# Patient Record
Sex: Female | Born: 1950 | Race: Black or African American | Hispanic: No | Smoking: Never smoker
Health system: Southern US, Community
[De-identification: ages and names within clinical notes are randomized; demographics above are authoritative.]

## PROBLEM LIST (undated history)

## (undated) ENCOUNTER — Ambulatory Visit (HOSPITAL_COMMUNITY): Admission: EM | Payer: Medicare PPO | Source: Home / Self Care

## (undated) DIAGNOSIS — E079 Disorder of thyroid, unspecified: Secondary | ICD-10-CM

## (undated) DIAGNOSIS — D219 Benign neoplasm of connective and other soft tissue, unspecified: Secondary | ICD-10-CM

## (undated) DIAGNOSIS — I10 Essential (primary) hypertension: Secondary | ICD-10-CM

## (undated) HISTORY — PX: KNEE SURGERY: SHX244

## (undated) HISTORY — PX: FOOT SURGERY: SHX648

## (undated) HISTORY — DX: Disorder of thyroid, unspecified: E07.9

## (undated) HISTORY — DX: Benign neoplasm of connective and other soft tissue, unspecified: D21.9

## (undated) HISTORY — PX: THYROID CYST EXCISION: SHX2511

## (undated) HISTORY — DX: Essential (primary) hypertension: I10

---

## 1994-11-28 HISTORY — PX: ABDOMINAL HYSTERECTOMY: SHX81

## 1999-10-14 ENCOUNTER — Other Ambulatory Visit: Admission: RE | Admit: 1999-10-14 | Discharge: 1999-10-14 | Payer: Self-pay | Admitting: Obstetrics and Gynecology

## 2000-05-23 ENCOUNTER — Encounter (HOSPITAL_BASED_OUTPATIENT_CLINIC_OR_DEPARTMENT_OTHER): Payer: Self-pay | Admitting: General Surgery

## 2000-05-25 ENCOUNTER — Ambulatory Visit (HOSPITAL_COMMUNITY): Admission: RE | Admit: 2000-05-25 | Discharge: 2000-05-26 | Payer: Self-pay | Admitting: General Surgery

## 2000-10-27 ENCOUNTER — Other Ambulatory Visit: Admission: RE | Admit: 2000-10-27 | Discharge: 2000-10-27 | Payer: Self-pay | Admitting: Obstetrics and Gynecology

## 2000-12-06 ENCOUNTER — Ambulatory Visit (HOSPITAL_COMMUNITY): Admission: RE | Admit: 2000-12-06 | Discharge: 2000-12-06 | Payer: Self-pay | Admitting: Obstetrics and Gynecology

## 2000-12-06 ENCOUNTER — Encounter: Payer: Self-pay | Admitting: Obstetrics and Gynecology

## 2001-12-07 ENCOUNTER — Ambulatory Visit (HOSPITAL_COMMUNITY): Admission: RE | Admit: 2001-12-07 | Discharge: 2001-12-07 | Payer: Self-pay | Admitting: Obstetrics and Gynecology

## 2001-12-07 ENCOUNTER — Encounter: Payer: Self-pay | Admitting: Obstetrics and Gynecology

## 2002-07-15 ENCOUNTER — Other Ambulatory Visit: Admission: RE | Admit: 2002-07-15 | Discharge: 2002-07-15 | Payer: Self-pay | Admitting: Obstetrics and Gynecology

## 2002-10-22 ENCOUNTER — Ambulatory Visit (HOSPITAL_COMMUNITY): Admission: RE | Admit: 2002-10-22 | Discharge: 2002-10-22 | Payer: Self-pay | Admitting: Internal Medicine

## 2002-10-22 ENCOUNTER — Encounter: Payer: Self-pay | Admitting: Internal Medicine

## 2003-10-30 ENCOUNTER — Other Ambulatory Visit: Admission: RE | Admit: 2003-10-30 | Discharge: 2003-10-30 | Payer: Self-pay | Admitting: Obstetrics and Gynecology

## 2004-11-11 ENCOUNTER — Other Ambulatory Visit: Admission: RE | Admit: 2004-11-11 | Discharge: 2004-11-11 | Payer: Self-pay | Admitting: Obstetrics and Gynecology

## 2005-11-15 ENCOUNTER — Other Ambulatory Visit: Admission: RE | Admit: 2005-11-15 | Discharge: 2005-11-15 | Payer: Self-pay | Admitting: Obstetrics and Gynecology

## 2006-07-02 ENCOUNTER — Emergency Department (HOSPITAL_COMMUNITY): Admission: EM | Admit: 2006-07-02 | Discharge: 2006-07-02 | Payer: Self-pay | Admitting: Emergency Medicine

## 2006-11-16 ENCOUNTER — Other Ambulatory Visit: Admission: RE | Admit: 2006-11-16 | Discharge: 2006-11-16 | Payer: Self-pay | Admitting: Obstetrics and Gynecology

## 2006-12-01 ENCOUNTER — Ambulatory Visit (HOSPITAL_COMMUNITY): Admission: RE | Admit: 2006-12-01 | Discharge: 2006-12-01 | Payer: Self-pay | Admitting: Obstetrics and Gynecology

## 2007-11-29 HISTORY — PX: MOLE REMOVAL: SHX2046

## 2007-11-29 HISTORY — PX: BREAST BIOPSY: SHX20

## 2007-12-05 ENCOUNTER — Ambulatory Visit (HOSPITAL_COMMUNITY): Admission: RE | Admit: 2007-12-05 | Discharge: 2007-12-05 | Payer: Self-pay | Admitting: Obstetrics and Gynecology

## 2008-09-17 ENCOUNTER — Encounter: Payer: Self-pay | Admitting: Women's Health

## 2008-09-17 ENCOUNTER — Other Ambulatory Visit: Admission: RE | Admit: 2008-09-17 | Discharge: 2008-09-17 | Payer: Self-pay | Admitting: Obstetrics and Gynecology

## 2008-09-17 ENCOUNTER — Ambulatory Visit: Payer: Self-pay | Admitting: Women's Health

## 2008-09-23 ENCOUNTER — Encounter: Admission: RE | Admit: 2008-09-23 | Discharge: 2008-09-23 | Payer: Self-pay | Admitting: Obstetrics and Gynecology

## 2008-10-27 ENCOUNTER — Encounter (INDEPENDENT_AMBULATORY_CARE_PROVIDER_SITE_OTHER): Payer: Self-pay | Admitting: Diagnostic Radiology

## 2008-10-27 ENCOUNTER — Encounter: Admission: RE | Admit: 2008-10-27 | Discharge: 2008-10-27 | Payer: Self-pay | Admitting: Obstetrics and Gynecology

## 2009-01-13 ENCOUNTER — Encounter: Admission: RE | Admit: 2009-01-13 | Discharge: 2009-01-13 | Payer: Self-pay | Admitting: Obstetrics and Gynecology

## 2009-12-24 ENCOUNTER — Ambulatory Visit: Payer: Self-pay | Admitting: Women's Health

## 2009-12-24 ENCOUNTER — Other Ambulatory Visit: Admission: RE | Admit: 2009-12-24 | Discharge: 2009-12-24 | Payer: Self-pay | Admitting: Obstetrics and Gynecology

## 2010-01-18 ENCOUNTER — Encounter: Admission: RE | Admit: 2010-01-18 | Discharge: 2010-01-18 | Payer: Self-pay | Admitting: Family Medicine

## 2010-03-03 ENCOUNTER — Ambulatory Visit: Payer: Self-pay | Admitting: Obstetrics and Gynecology

## 2010-04-30 ENCOUNTER — Ambulatory Visit: Payer: Self-pay | Admitting: Women's Health

## 2010-12-29 ENCOUNTER — Other Ambulatory Visit: Payer: Self-pay | Admitting: Women's Health

## 2010-12-29 DIAGNOSIS — Z139 Encounter for screening, unspecified: Secondary | ICD-10-CM

## 2010-12-29 DIAGNOSIS — Z1231 Encounter for screening mammogram for malignant neoplasm of breast: Secondary | ICD-10-CM

## 2011-01-06 ENCOUNTER — Other Ambulatory Visit: Payer: Self-pay | Admitting: Women's Health

## 2011-01-06 ENCOUNTER — Encounter (INDEPENDENT_AMBULATORY_CARE_PROVIDER_SITE_OTHER): Payer: BC Managed Care – PPO | Admitting: Women's Health

## 2011-01-06 DIAGNOSIS — Z124 Encounter for screening for malignant neoplasm of cervix: Secondary | ICD-10-CM | POA: Insufficient documentation

## 2011-01-06 DIAGNOSIS — Z01419 Encounter for gynecological examination (general) (routine) without abnormal findings: Secondary | ICD-10-CM

## 2011-01-19 ENCOUNTER — Other Ambulatory Visit (HOSPITAL_COMMUNITY)
Admission: RE | Admit: 2011-01-19 | Discharge: 2011-01-19 | Disposition: A | Discharge status: Home / Self Care | Payer: BC Managed Care – PPO | Source: Ambulatory Visit | Attending: Obstetrics and Gynecology | Admitting: Obstetrics and Gynecology

## 2011-01-25 ENCOUNTER — Other Ambulatory Visit: Payer: Self-pay | Admitting: Women's Health

## 2011-01-25 ENCOUNTER — Ambulatory Visit
Admission: RE | Admit: 2011-01-25 | Discharge: 2011-01-25 | Disposition: A | Discharge status: Home / Self Care | Payer: BC Managed Care – PPO | Source: Ambulatory Visit | Attending: Women's Health | Admitting: Women's Health

## 2011-01-25 ENCOUNTER — Ambulatory Visit (HOSPITAL_COMMUNITY): Admission: RE | Admit: 2011-01-25 | Payer: BC Managed Care – PPO | Source: Ambulatory Visit

## 2011-01-25 DIAGNOSIS — Z1231 Encounter for screening mammogram for malignant neoplasm of breast: Secondary | ICD-10-CM

## 2011-04-15 NOTE — Op Note (Signed)
[place]. [facility]  Patient:    Becky Cox, Becky Cox                       MRN: [id number] Proc. Date: 05/25/00 Adm. 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 Pathology pending.  OPERATION/PROCEDURE:  Left thyroid lobectomy and isthmectomy.  ANESTHESIA: General.  SURGEON: Patrick L. Lurene Shadow, M.D.  ASSISTANT:  Marnee Spring. Wiliam Ke, M.D.  INDICATIONS:  The patient is a 61 year old woman who presented with a large thyroid nodule which on aspiration biopsy shows multiple Hurthle cells. She is brought to the operating room now for thyroid lobectomy with frozen section.  DESCRIPTION OF PROCEDURE:  Following the induction of satisfactory anesthesia, with the patient positioned supinely with the neck hyperextended. The anterior chest and neck were prepped and draped as to be included in the sterile operative field. A transverse incision was made in the neck and carried down through the skin and subcutaneous tissues approximately two finger breadths above the sternal notch. Dissection was carried down across the platysma muscle and a superior flap was raised up to the thyroid cartilage and an inferior flap down to the serial notch. The strap muscles were opened in the midline and dissection carried to the left, thus mobilizing the left lobe. The middle thyroid vein was taken between clips. Dissection of the inferior pole of the gland allowed the isolation of the recurrent laryngeal nerve. The left lower parathyroid gland was identified and spared. The thyroid gland lower pole vessels were taken between clamps and secured with ties of 3-0 silk. Dissection was carried up along the nerve, protecting it throughout the course of the dissection and freeing the  thyroid up to the upper pole. I did not recognize an upper pole thyroid gland. Dissection was close to the thyroid gland. The upper pole vessels were secured with clamps and doubly secured with 0 silk sutures. The thyroid was then mobilized across the isthmus and taking along with it the entire thyroid gland. This was carried all the way over to the left lobe of the thyroid. The thyroid was then transected between clamps and secured with suture ligatures of 3-0 Vicryl. The tumor was removed for frozen pathologic evaluation. The frozen section showed multiple Hurthle cells without any evidence of vascular invasion. The neck was then thoroughly irrigated and the wound checked for hemostasis, being treated with electrocautery. Sponge, instrument, and sharp counts were verified. The midline incision was closed with a running suture of 0-Vicryl suture. The platysma and subcutaneous tissues reapproximated with 3-0 Vicryl sutures. The skin closed with 5-0 Monocryl and reinforced with Steri-Strips. Sterile dressing applied. Anesthetic reversed. The patient removed from the operating room to the recovery room in stable condition having tolerated the procedure well. DD:  05/25/00 TD:  05/26/00 Job: [id number]

## 2011-04-29 HISTORY — PX: ANKLE SURGERY: SHX546

## 2011-05-02 ENCOUNTER — Emergency Department (HOSPITAL_COMMUNITY): Payer: BC Managed Care – PPO

## 2011-05-02 ENCOUNTER — Emergency Department (HOSPITAL_COMMUNITY)
Admission: EM | Admit: 2011-05-02 | Discharge: 2011-05-03 | Disposition: A | Discharge status: Home / Self Care | Payer: BC Managed Care – PPO | Attending: Emergency Medicine | Admitting: Emergency Medicine

## 2011-05-02 DIAGNOSIS — M25476 Effusion, unspecified foot: Secondary | ICD-10-CM | POA: Insufficient documentation

## 2011-05-02 DIAGNOSIS — G8929 Other chronic pain: Secondary | ICD-10-CM | POA: Insufficient documentation

## 2011-05-02 DIAGNOSIS — I10 Essential (primary) hypertension: Secondary | ICD-10-CM | POA: Insufficient documentation

## 2011-05-02 DIAGNOSIS — M25579 Pain in unspecified ankle and joints of unspecified foot: Secondary | ICD-10-CM | POA: Insufficient documentation

## 2011-05-02 DIAGNOSIS — M25473 Effusion, unspecified ankle: Secondary | ICD-10-CM | POA: Insufficient documentation

## 2011-05-24 ENCOUNTER — Ambulatory Visit (HOSPITAL_BASED_OUTPATIENT_CLINIC_OR_DEPARTMENT_OTHER)
Admission: RE | Admit: 2011-05-24 | Discharge: 2011-05-24 | Disposition: A | Discharge status: Home / Self Care | Payer: BC Managed Care – PPO | Source: Ambulatory Visit | Attending: Orthopaedic Surgery | Admitting: Orthopaedic Surgery

## 2011-05-24 DIAGNOSIS — M24473 Recurrent dislocation, unspecified ankle: Secondary | ICD-10-CM | POA: Insufficient documentation

## 2011-05-24 DIAGNOSIS — M24476 Recurrent dislocation, unspecified foot: Secondary | ICD-10-CM | POA: Insufficient documentation

## 2011-05-24 DIAGNOSIS — Z01810 Encounter for preprocedural cardiovascular examination: Secondary | ICD-10-CM | POA: Insufficient documentation

## 2011-05-24 LAB — POCT I-STAT, CHEM 8
BUN: 16 mg/dL (ref 6–23)
Calcium, Ion: 1.19 mmol/L (ref 1.12–1.32)
Chloride: 109 mEq/L (ref 96–112)
Creatinine, Ser: 0.7 mg/dL (ref 0.50–1.10)
TCO2: 25 mmol/L (ref 0–100)

## 2011-05-31 NOTE — Op Note (Signed)
  NAMELIZZA, Becky Cox NO.:  [id number]  MEDICAL RECORD NO.:  [id number]  LOCATION:                                 FACILITY:  PHYSICIAN:  Lubertha Basque. Bellamy Judson, M.D.DATE OF BIRTH:  1951/01/19  DATE OF PROCEDURE:  05/24/2011 DATE OF DISCHARGE:                              OPERATIVE REPORT   PREOPERATIVE DIAGNOSIS:  Right ankle impingement.  POSTOPERATIVE DIAGNOSIS:  Right ankle impingement.  PROCEDURE:  Right ankle debridement.  ANESTHESIA:  General and block.  ATTENDING SURGEON:  Lubertha Basque. Jerl Santos, MD  ASSISTANT:  Lindwood Qua, PA   INDICATION FOR PROCEDURE:  The patient is a 60 year old woman with many months of intense right ankle pain.  This has persisted despite rest and an injection which did afford her transient relief.  She has experienced recurrent pain with trips to the emergency room and on x-ray, does have a small anterior spur.  She is offered an arthroscopy.  Informed operative consent was obtained after discussion of possible complications including reaction to anesthesia, infection, and neurovascular injury.  SUMMARY OF FINDINGS AND PROCEDURE:  Under general anesthesia and block, a right ankle arthroscopy was performed.  There were no degenerative changes that I could appreciate.  She did have some soft tissue impingement in the lateral gutter and a thorough debridement was done there.  The medial gutter was relatively benign.  She had a small anterior spur on the tibia dressed with resection.  DESCRIPTION OF PROCEDURE:  The patient was taken to the operating suite where general anesthetic was applied without difficulty.  She was also given a popliteal block in the preanesthesia area.  She was positioned supine and the right leg was placed into a well-padded stirrup.  Some light traction was placed across the ankle with an elastic band.  I then injected the ankle with 10 mL of normal saline from an anteromedial approach.  I then  made a small incision just medial to the anterior tibial tendon with blunt dissection down through the capsule.  The scope was introduced.  I made a second portal in a similar fashion anterolateral near the gutter by localizing using a spinal needle followed by a small incision and blunt dissection into the capsule. Findings were as noted above and procedure consisted of the removal of the soft tissue from the lateral gutter which was done with a small shaver.  We then removed a small anterior spur off the tibia with a bur. The ankle was thoroughly irrigated followed by reapproximation of portals loosely with nylon.  Adaptic was applied followed by dry gauze and a loose Ace wrap.  Estimated blood loss and intraoperative fluids can be obtained from anesthesia records.  DISPOSITION:  The patient was extubated in the operating room and taken to recovery room in stable addition.  She was to go home same day and follow up in the office next week.  I will contact her by phone tonight.     Lubertha Basque Jerl Santos, M.D.     PGD/MEDQ  D:  05/24/2011  T:  05/25/2011  Job:  [id number]  Electronically Signed by Marcene Corning M.D. on 05/31/2011 05:29:52 PM

## 2011-12-26 ENCOUNTER — Other Ambulatory Visit: Payer: Self-pay | Admitting: Family Medicine

## 2011-12-26 DIAGNOSIS — Z1231 Encounter for screening mammogram for malignant neoplasm of breast: Secondary | ICD-10-CM

## 2012-01-16 ENCOUNTER — Ambulatory Visit (INDEPENDENT_AMBULATORY_CARE_PROVIDER_SITE_OTHER): Payer: BC Managed Care – PPO | Admitting: Women's Health

## 2012-01-16 ENCOUNTER — Encounter: Payer: Self-pay | Admitting: Women's Health

## 2012-01-16 VITALS — BP 158/82 | Ht 63.0 in | Wt 235.0 lb

## 2012-01-16 DIAGNOSIS — Z01419 Encounter for gynecological examination (general) (routine) without abnormal findings: Secondary | ICD-10-CM

## 2012-01-16 NOTE — Patient Instructions (Signed)
zostovac pnuemovac Dtap

## 2012-01-16 NOTE — Progress Notes (Signed)
Becky Cox [birth year] [id number]    History:    The patient presents for annual exam.  TAH for fibroids and menorrhagia in 96, on no ERT. Hypertension and hyperthyroidism managed by primary care. History of normal Paps and mammograms. Normal DEXA in 2011. Colonoscopy February  2013 with one benign polyp.   Past medical history, past surgical history, family history and social history were all reviewed and documented in the EPIC chart. Works at [employer].   ROS:  A  ROS was performed and pertinent positives and negatives are included in the history.  Exam:  Filed Vitals:   01/16/12 1440  BP: 158/82    General appearance:  Normal Head/Neck:  Normal, without cervical or supraclavicular adenopathy. Thyroid:  Symmetrical, normal in size, without palpable masses or nodularity. Respiratory  Effort:  Normal  Auscultation:  Clear without wheezing or rhonchi Cardiovascular  Auscultation:  Regular rate, without rubs, murmurs or gallops  Edema/varicosities:  Not grossly evident Abdominal  Soft,nontender, without masses, guarding or rebound.  Liver/spleen:  No organomegaly noted  Hernia:  None appreciated  Skin  Inspection:  Grossly normal  Palpation:  Grossly normal Neurologic/psychiatric  Orientation:  Normal with appropriate conversation.  Mood/affect:  Normal  Genitourinary    Breasts: Examined lying and sitting/pendulous.     Right: Without masses, retractions, discharge or axillary adenopathy.     Left: Without masses, retractions, discharge or axillary adenopathy.   Inguinal/mons:  Normal without inguinal adenopathy  External genitalia:  Normal  BUS/Urethra/Skene's glands:  Normal  Bladder:  Normal  Vagina:  +1 rectocele/asymptomatic  Cervix: Absent  Uterus:   Adnexa/parametria:     Rt: Without masses or tenderness.   Lt: Without masses or tenderness.  Anus and perineum:   Digital rectal exam: Normal sphincter tone without palpated masses or  tenderness  Assessment/Plan:  61 y.o. DBF G2 P2 for annual exam.  +1 rectocele/asymptomatic  Postmenopausal on no ERT Hypertension/hypothyroidism-primary care labs and meds  Plan: SBE's, annual mammogram, increased exercise decrease calories for weight loss. Reviewed importance of weight loss for health. Has occasional itching/rash and groin reviewed prevention has none today. Normal DEXA in 2011 will repeat next year. Reviewed importance of weightbearing exercise, home safety and fall prevention. Encourage vitamin D 2000 daily.   Harrington Challenger [facility], 6:02 PM 01/16/2012

## 2012-01-27 ENCOUNTER — Ambulatory Visit
Admission: RE | Admit: 2012-01-27 | Discharge: 2012-01-27 | Disposition: A | Discharge status: Home / Self Care | Payer: BC Managed Care – PPO | Source: Ambulatory Visit | Attending: Family Medicine | Admitting: Family Medicine

## 2012-01-27 DIAGNOSIS — Z1231 Encounter for screening mammogram for malignant neoplasm of breast: Secondary | ICD-10-CM

## 2012-01-31 ENCOUNTER — Other Ambulatory Visit: Payer: Self-pay | Admitting: Family Medicine

## 2012-01-31 DIAGNOSIS — R928 Other abnormal and inconclusive findings on diagnostic imaging of breast: Secondary | ICD-10-CM

## 2012-02-01 ENCOUNTER — Ambulatory Visit
Admission: RE | Admit: 2012-02-01 | Discharge: 2012-02-01 | Disposition: A | Discharge status: Home / Self Care | Payer: BC Managed Care – PPO | Source: Ambulatory Visit | Attending: Family Medicine | Admitting: Family Medicine

## 2012-02-01 DIAGNOSIS — R928 Other abnormal and inconclusive findings on diagnostic imaging of breast: Secondary | ICD-10-CM

## 2012-10-31 IMAGING — CR DG ANKLE COMPLETE 3+V*R*
3 series · 3 of 3 positions shown · non-contrast
Comparison: None.

CLINICAL DATA: Medial right ankle pain for 1 month.

RIGHT ANKLE - COMPLETE 3+ VIEW

[t ankle joint ap right]
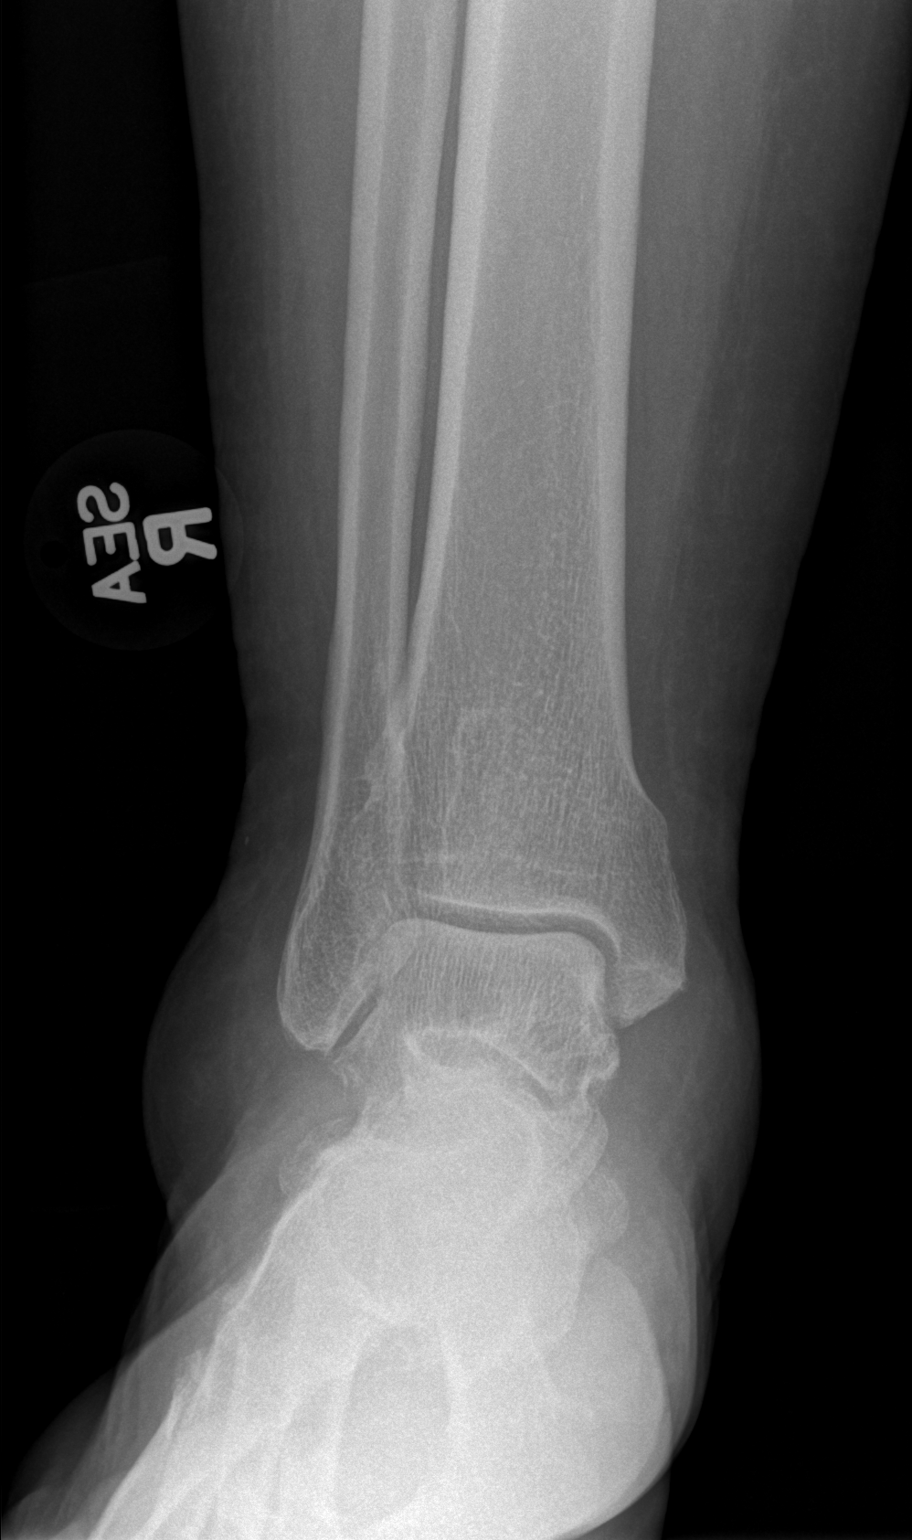

[t ankle joint oblique right]
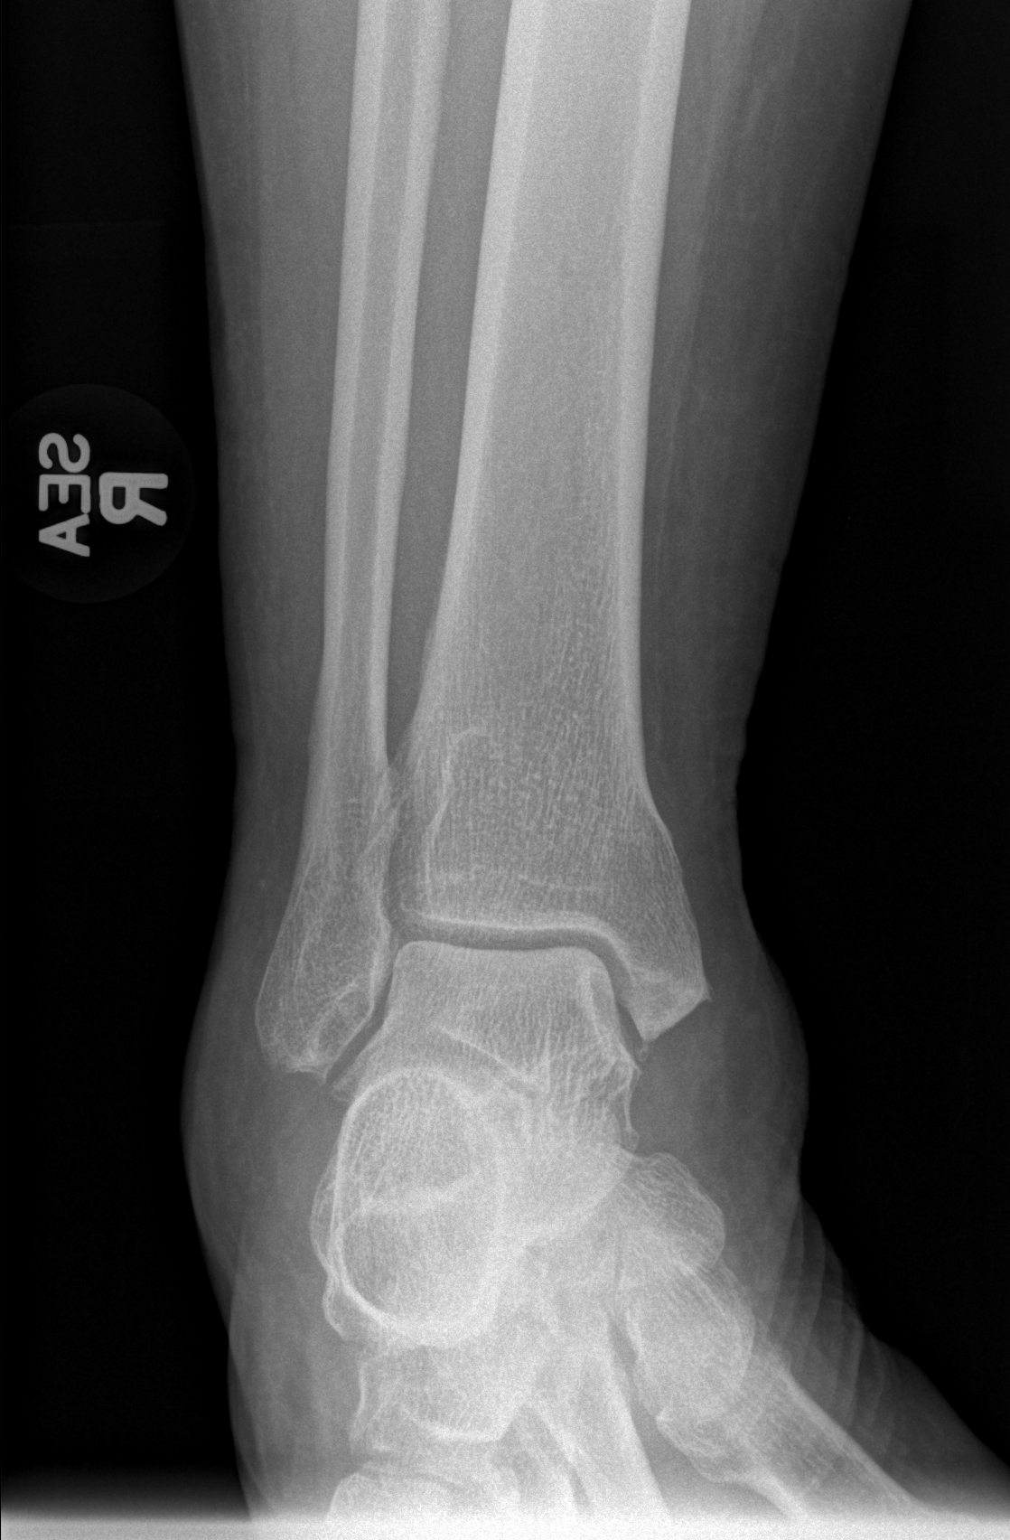

[t ankle joint lat right]
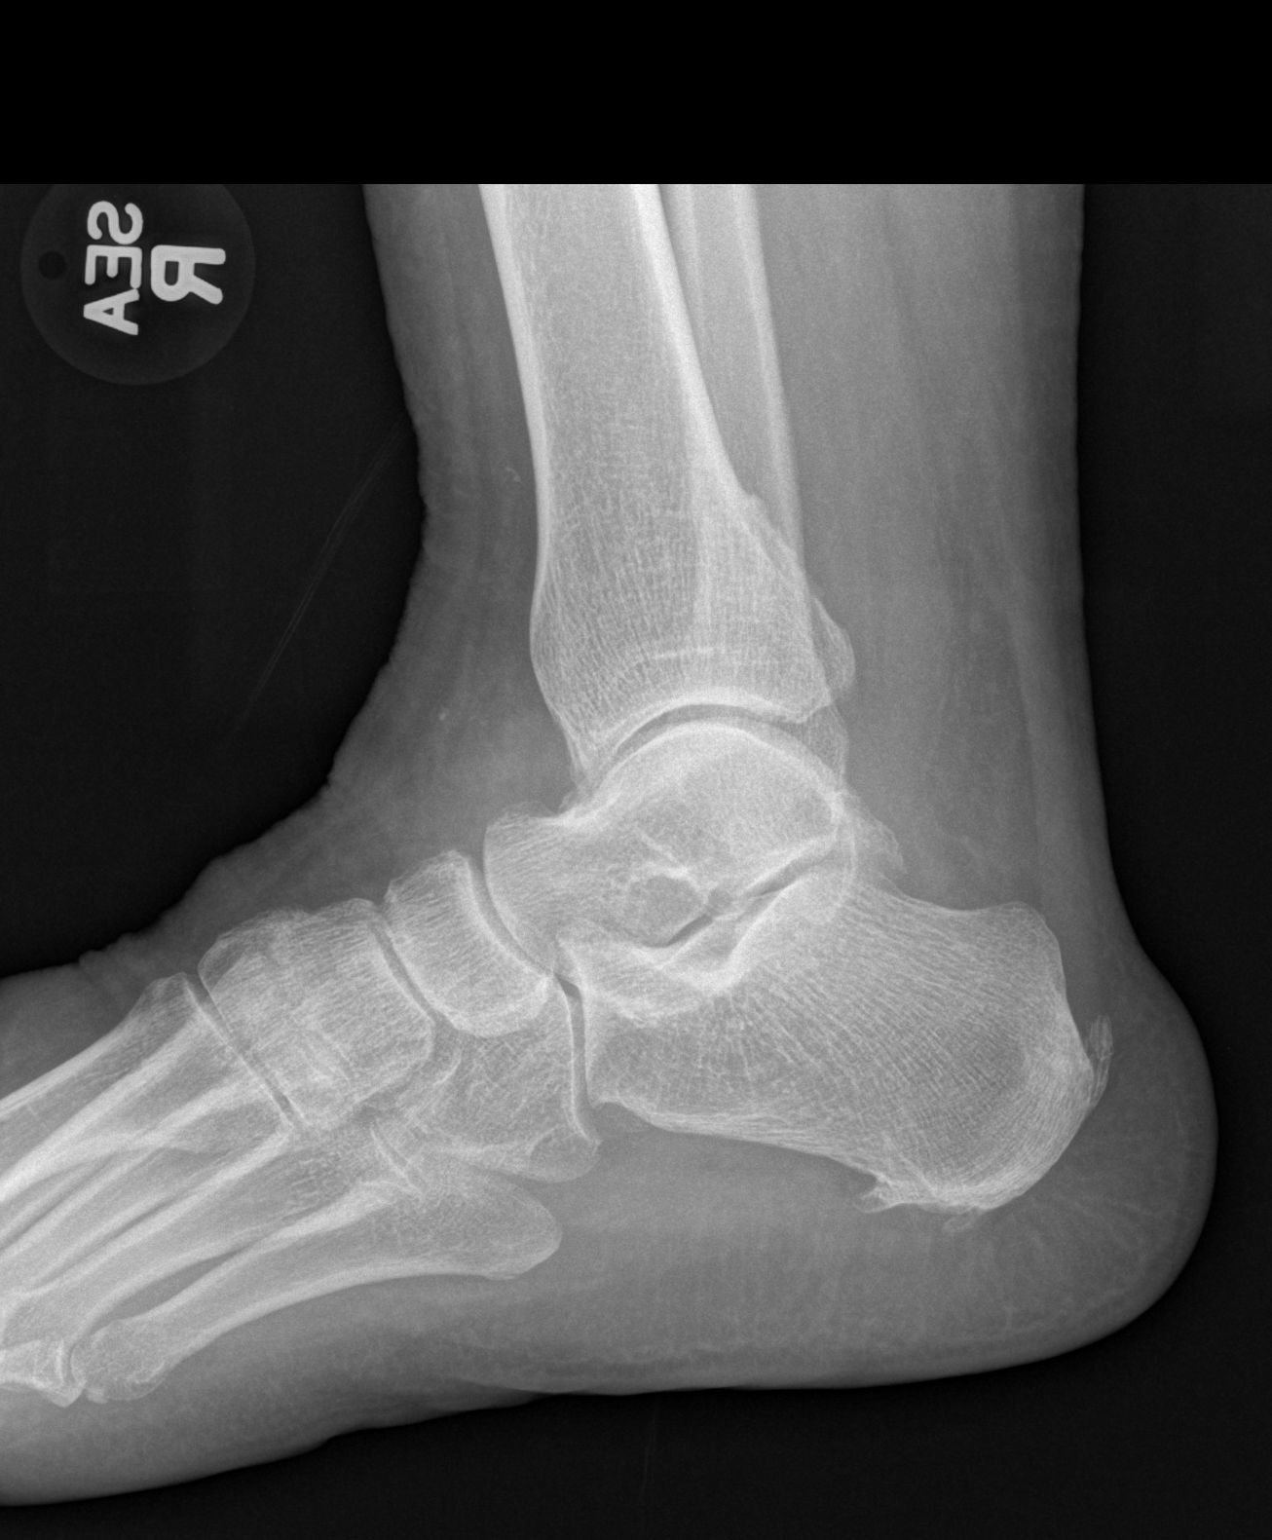

[3 of 3 positions shown; findings below may reference images not displayed]

FINDINGS: Medial and lateral malleolar spurring appears mild.

The plafond and talar dome appear intact.

No fracture identified.

Plantar and Achilles calcaneal spurs noted.
IMPRESSION: 1.  Hindfoot spurring as noted above, without acute bony findings.

## 2013-01-07 ENCOUNTER — Other Ambulatory Visit: Payer: Self-pay | Admitting: Family Medicine

## 2013-01-07 DIAGNOSIS — Z1239 Encounter for other screening for malignant neoplasm of breast: Secondary | ICD-10-CM

## 2013-01-09 ENCOUNTER — Encounter: Payer: BC Managed Care – PPO | Admitting: Women's Health

## 2013-01-16 ENCOUNTER — Ambulatory Visit (INDEPENDENT_AMBULATORY_CARE_PROVIDER_SITE_OTHER): Payer: BC Managed Care – PPO | Admitting: Women's Health

## 2013-01-16 ENCOUNTER — Encounter: Payer: Self-pay | Admitting: Women's Health

## 2013-01-16 VITALS — BP 136/80 | Ht 63.5 in | Wt 225.0 lb

## 2013-01-16 DIAGNOSIS — I10 Essential (primary) hypertension: Secondary | ICD-10-CM

## 2013-01-16 DIAGNOSIS — Z01419 Encounter for gynecological examination (general) (routine) without abnormal findings: Secondary | ICD-10-CM

## 2013-01-16 DIAGNOSIS — E039 Hypothyroidism, unspecified: Secondary | ICD-10-CM

## 2013-01-16 NOTE — Patient Instructions (Signed)

## 2013-01-16 NOTE — Progress Notes (Signed)
Becky Cox 1951-06-07 [id number]    History:    The patient presents for annual exam.  TAH 1996 for fibroids and menorrhagia/no ERT. Normal bone density 2011, T score 0.8 at spine bilateral hip average 0.5. Colonoscopy 2013 benign polyp. History of normal mammograms. Hypertension/hypothyroid managed primary care. Zostavac 2013.   Past medical history, past surgical history, family history and social history were all reviewed and documented in the EPIC chart. Retired from [employer] after 25 years. Helping with 24 year old mother who has Alzheimer's and sister with MS. Sister with breast and colon cancer. Numerous family members with hypertension.   ROS:  A  ROS was performed and pertinent positives and negatives are included in the history.  Exam:  Filed Vitals:   01/16/13 1151  BP: 136/80    General appearance:  Normal Head/Neck:  Normal, without cervical or supraclavicular adenopathy. Thyroid:  Symmetrical, normal in size, without palpable masses or nodularity. Respiratory  Effort:  Normal  Auscultation:  Clear without wheezing or rhonchi Cardiovascular  Auscultation:  Regular rate, without rubs, murmurs or gallops  Edema/varicosities:  Not grossly evident Abdominal  Soft,nontender, without masses, guarding or rebound.  Liver/spleen:  No organomegaly noted  Hernia:  None appreciated  Skin  Inspection:  Grossly normal/numerous skin tags  Palpation:  Grossly normal Neurologic/psychiatric  Orientation:  Normal with appropriate conversation.  Mood/affect:  Normal  Genitourinary    Breasts: Examined lying and sitting.     Right: Without masses, retractions, discharge or axillary adenopathy.     Left: Without masses, retractions, discharge or axillary adenopathy.   Inguinal/mons:  Normal without inguinal adenopathy  External genitalia:  Normal  BUS/Urethra/Skene's glands:  Normal  Bladder:  Normal  Vagina:  Normal  Cervix:  absent  Uterus:  absent  Adnexa/parametria:      Rt: Without masses or tenderness.   Lt: Without masses or tenderness.  Anus and perineum: Normal  Digital rectal exam: Normal sphincter tone without palpated masses or tenderness  Assessment/Plan:  62 y.o. DBF G2P2 for annual exam with no complaints.  Normal postmenopausal exam Normal bone density 2011 Benign colon :polyp 12/2011 Hypertension/hypothyroid-primary care labs and meds  Plan: SBE's, continue annual mammogram, home Hemoccult card given with instructions. Continue to increase regular exercise, calcium rich diet, vitamin D 2000 daily, decrease calories for weight loss encouraged.repeat DEXA next year. Condoms encouraged if become sexually active. History of normal Paps, new screening guidelines reviewed.  Harrington Challenger WHNP, 1:14 PM 01/16/2013

## 2013-01-29 ENCOUNTER — Ambulatory Visit
Admission: RE | Admit: 2013-01-29 | Discharge: 2013-01-29 | Disposition: A | Discharge status: Home / Self Care | Payer: BC Managed Care – PPO | Source: Ambulatory Visit | Attending: Family Medicine | Admitting: Family Medicine

## 2013-01-29 DIAGNOSIS — Z1239 Encounter for other screening for malignant neoplasm of breast: Secondary | ICD-10-CM

## 2013-01-30 ENCOUNTER — Other Ambulatory Visit: Payer: Self-pay | Admitting: Otolaryngology

## 2013-12-04 ENCOUNTER — Other Ambulatory Visit: Payer: Self-pay | Admitting: Otolaryngology

## 2014-01-20 ENCOUNTER — Other Ambulatory Visit: Payer: Self-pay | Admitting: Family Medicine

## 2014-01-20 DIAGNOSIS — N644 Mastodynia: Secondary | ICD-10-CM

## 2014-01-31 ENCOUNTER — Ambulatory Visit
Admission: RE | Admit: 2014-01-31 | Discharge: 2014-01-31 | Disposition: A | Discharge status: Home / Self Care | Payer: BC Managed Care – PPO | Source: Ambulatory Visit | Attending: Family Medicine | Admitting: Family Medicine

## 2014-01-31 DIAGNOSIS — N644 Mastodynia: Secondary | ICD-10-CM

## 2014-09-29 ENCOUNTER — Encounter: Payer: Self-pay | Admitting: Women's Health

## 2015-02-10 ENCOUNTER — Other Ambulatory Visit: Payer: Self-pay

## 2015-02-10 DIAGNOSIS — Z1231 Encounter for screening mammogram for malignant neoplasm of breast: Secondary | ICD-10-CM

## 2015-02-17 ENCOUNTER — Ambulatory Visit: Payer: BC Managed Care – PPO

## 2015-02-17 ENCOUNTER — Ambulatory Visit
Admission: RE | Admit: 2015-02-17 | Discharge: 2015-02-17 | Disposition: A | Discharge status: Home / Self Care | Payer: BC Managed Care – PPO | Source: Ambulatory Visit

## 2015-02-17 ENCOUNTER — Other Ambulatory Visit: Payer: Self-pay

## 2015-02-17 DIAGNOSIS — Z1231 Encounter for screening mammogram for malignant neoplasm of breast: Secondary | ICD-10-CM

## 2015-03-05 ENCOUNTER — Encounter: Payer: Self-pay | Admitting: Women's Health

## 2015-03-05 ENCOUNTER — Ambulatory Visit (INDEPENDENT_AMBULATORY_CARE_PROVIDER_SITE_OTHER): Payer: BC Managed Care – PPO | Admitting: Women's Health

## 2015-03-05 VITALS — BP 124/78 | Ht 63.0 in | Wt 239.0 lb

## 2015-03-05 DIAGNOSIS — Z23 Encounter for immunization: Secondary | ICD-10-CM

## 2015-03-05 DIAGNOSIS — Z01419 Encounter for gynecological examination (general) (routine) without abnormal findings: Secondary | ICD-10-CM

## 2015-03-05 NOTE — Patient Instructions (Signed)
Health Recommendations for Postmenopausal Women Respected and ongoing research has looked at the most common causes of death, disability, and poor quality of life in postmenopausal women. The causes include heart disease, diseases of blood vessels, diabetes, depression, cancer, and bone loss (osteoporosis). Many things can be done to help lower the chances of developing these and other common problems. CARDIOVASCULAR DISEASE Heart Disease: A heart attack is a medical emergency. Know the signs and symptoms of a heart attack. Below are things women can do to reduce their risk for heart disease.   Do not smoke. If you smoke, quit.  Aim for a healthy weight. Being overweight causes many preventable deaths. Eat a healthy and balanced diet and drink an adequate amount of liquids.  Get moving. Make a commitment to be more physically active. Aim for 30 minutes of activity on most, if not all days of the week.  Eat for heart health. Choose a diet that is low in saturated fat and cholesterol and eliminate trans fat. Include whole grains, vegetables, and fruits. Read and understand the labels on food containers before buying.  Know your numbers. Ask your caregiver to check your blood pressure, cholesterol (total, HDL, LDL, triglycerides) and blood glucose. Work with your caregiver on improving your entire clinical picture.  High blood pressure. Limit or stop your table salt intake (try salt substitute and food seasonings). Avoid salty foods and drinks. Read labels on food containers before buying. Eating well and exercising can help control high blood pressure. STROKE  Stroke is a medical emergency. Stroke may be the result of a blood clot in a blood vessel in the brain or by a brain hemorrhage (bleeding). Know the signs and symptoms of a stroke. To lower the risk of developing a stroke:  Avoid fatty foods.  Quit smoking.  Control your diabetes, blood pressure, and irregular heart rate. THROMBOPHLEBITIS  (BLOOD CLOT) OF THE LEG  Becoming overweight and leading a stationary lifestyle may also contribute to developing blood clots. Controlling your diet and exercising will help lower the risk of developing blood clots. CANCER SCREENING  Breast Cancer: Take steps to reduce your risk of breast cancer.  You should practice "breast self-awareness." This means understanding the normal appearance and feel of your breasts and should include breast self-examination. Any changes detected, no matter how small, should be reported to your caregiver.  After age 40, you should have a clinical breast exam (CBE) every year.  Starting at age 40, you should consider having a mammogram (breast X-ray) every year.  If you have a family history of breast cancer, talk to your caregiver about genetic screening.  If you are at high risk for breast cancer, talk to your caregiver about having an MRI and a mammogram every year.  Intestinal or Stomach Cancer: Tests to consider are a rectal exam, fecal occult blood, sigmoidoscopy, and colonoscopy. Women who are high risk may need to be screened at an earlier age and more often.  Cervical Cancer:  Beginning at age 30, you should have a Pap test every 3 years as long as the past 3 Pap tests have been normal.  If you have had past treatment for cervical cancer or a condition that could lead to cancer, you need Pap tests and screening for cancer for at least 20 years after your treatment.  If you had a hysterectomy for a problem that was not cancer or a condition that could lead to cancer, then you no longer need Pap tests.    If you are between ages 65 and 70, and you have had normal Pap tests going back 10 years, you no longer need Pap tests.  If Pap tests have been discontinued, risk factors (such as a new sexual partner) need to be reassessed to determine if screening should be resumed.  Some medical problems can increase the chance of getting cervical cancer. In these  cases, your caregiver may recommend more frequent screening and Pap tests.  Uterine Cancer: If you have vaginal bleeding after reaching menopause, you should notify your caregiver.  Ovarian Cancer: Other than yearly pelvic exams, there are no reliable tests available to screen for ovarian cancer at this time except for yearly pelvic exams.  Lung Cancer: Yearly chest X-rays can detect lung cancer and should be done on high risk women, such as cigarette smokers and women with chronic lung disease (emphysema).  Skin Cancer: A complete body skin exam should be done at your yearly examination. Avoid overexposure to the sun and ultraviolet light lamps. Use a strong sun block cream when in the sun. All of these things are important for lowering the risk of skin cancer. MENOPAUSE Menopause Symptoms: Hormone therapy products are effective for treating symptoms associated with menopause:  Moderate to severe hot flashes.  Night sweats.  Mood swings.  Headaches.  Tiredness.  Loss of sex drive.  Insomnia.  Other symptoms. Hormone replacement carries certain risks, especially in older women. Women who use or are thinking about using estrogen or estrogen with progestin treatments should discuss that with their caregiver. Your caregiver will help you understand the benefits and risks. The ideal dose of hormone replacement therapy is not known. The Food and Drug Administration (FDA) has concluded that hormone therapy should be used only at the lowest doses and for the shortest amount of time to reach treatment goals.  OSTEOPOROSIS Protecting Against Bone Loss and Preventing Fracture If you use hormone therapy for prevention of bone loss (osteoporosis), the risks for bone loss must outweigh the risk of the therapy. Ask your caregiver about other medications known to be safe and effective for preventing bone loss and fractures. To guard against bone loss or fractures, the following is recommended:  If  you are younger than age 50, take 1000 mg of calcium and at least 600 mg of Vitamin D per day.  If you are older than age 50 but younger than age 70, take 1200 mg of calcium and at least 600 mg of Vitamin D per day.  If you are older than age 70, take 1200 mg of calcium and at least 800 mg of Vitamin D per day. Smoking and excessive alcohol intake increases the risk of osteoporosis. Eat foods rich in calcium and vitamin D and do weight bearing exercises several times a week as your caregiver suggests. DIABETES Diabetes Mellitus: If you have type I or type 2 diabetes, you should keep your blood sugar under control with diet, exercise, and recommended medication. Avoid starchy and fatty foods, and too many sweets. Being overweight can make diabetes control more difficult. COGNITION AND MEMORY Cognition and Memory: Menopausal hormone therapy is not recommended for the prevention of cognitive disorders such as Alzheimer's disease or memory loss.  DEPRESSION  Depression may occur at any age, but it is common in elderly women. This may be because of physical, medical, social (loneliness), or financial problems and needs. If you are experiencing depression because of medical problems and control of symptoms, talk to your caregiver about this. Physical   activity and exercise may help with mood and sleep. Community and volunteer involvement may improve your sense of value and worth. If you have depression and you feel that the problem is getting worse or becoming severe, talk to your caregiver about which treatment options are best for you. ACCIDENTS  Accidents are common and can be serious in elderly woman. Prepare your house to prevent accidents. Eliminate throw rugs, place hand bars in bath, shower, and toilet areas. Avoid wearing high heeled shoes or walking on wet, snowy, and icy areas. Limit or stop driving if you have vision or hearing problems, or if you feel you are unsteady with your movements and  reflexes. HEPATITIS C Hepatitis C is a type of viral infection affecting the liver. It is spread mainly through contact with blood from an infected person. It can be treated, but if left untreated, it can lead to severe liver damage over the years. Many people who are infected do not know that the virus is in their blood. If you are a "baby-boomer", it is recommended that you have one screening test for Hepatitis C. IMMUNIZATIONS  Several immunizations are important to consider having during your senior years, including:   Tetanus, diphtheria, and pertussis booster shot.  Influenza every year before the flu season begins.  Pneumonia vaccine.  Shingles vaccine.  Others, as indicated based on your specific needs. Talk to your caregiver about these. Document Released: 01/06/2006 Document Revised: 03/31/2014 Document Reviewed: 09/01/2008 ExitCare Patient Information 2015 [employer]. This information is not intended to replace advice given to you by your health care provider. Make sure you discuss any questions you have with your health care provider.  

## 2015-03-05 NOTE — Progress Notes (Signed)
JYRA LAGARES 1951-07-29 [id number]    History:    Presents for annual exam.  64 TAH for fibroids on no HRT. Not sexually active. Normal Pap and mammogram history. Normal DEXA per patient 2015 at primary care. 2013 benign colon polyp. Tonsillectomy for benign growth 2015. Received Zostavax at primary care.  Past medical history, past surgical history, family history and social history were all reviewed and documented in the EPIC chart. Retired from [employer]. Sister breast cancer,  sister colon cancer both survivors. Mother Alzheimer's. 2 children, 3 grandchildren all doing well.  ROS:  A ROS was performed and pertinent positives and negatives are included.  Exam:  Filed Vitals:   03/05/15 1202  BP: 124/78    General appearance:  Normal Thyroid:  Symmetrical, normal in size, without palpable masses or nodularity. Respiratory  Auscultation:  Clear without wheezing or rhonchi Cardiovascular  Auscultation:  Regular rate, without rubs, murmurs or gallops  Edema/varicosities:  Not grossly evident Abdominal  Soft,nontender, without masses, guarding or rebound.  Liver/spleen:  No organomegaly noted  Hernia:  None appreciated  Skin  Inspection:  Grossly normal   Breasts: Examined lying and sitting.     Right: Without masses, retractions, discharge or axillary adenopathy.     Left: Without masses, retractions, discharge or axillary adenopathy. Gentitourinary   Inguinal/mons:  Normal without inguinal adenopathy  External genitalia: +2 rectocele  BUS/Urethra/Skene's glands:  Normal  Vagina:  Normal  Cervix: And uterus absent  Adnexa/parametria:     Rt: Without masses or tenderness.   Lt: Without masses or tenderness.  Anus and perineum: Normal  Digital rectal exam: Normal sphincter tone without palpated masses or tenderness  Assessment/Plan:  64 y.o. DBF G2P2 for annual exam with no complaints.  Asymptomatic rectocele TAH-fibroids no HRT Obesity (15 pound weight gain in past  year) Hypertension-primary care manages labs and meds Normal DEXA-primary care  Plan: SBE's, continue annual screening mammogram, 3-D tomography reviewed and encouraged history of dense breasts. Increase regular exercise and decrease calories for weight loss. Vitamin D 1000 daily encouraged. Condoms encouraged if sexually active. Home safety, fall prevention discussed. UA. T dap given.   [place], 12:30 PM 03/05/2015

## 2015-03-06 LAB — URINALYSIS, ROUTINE W REFLEX MICROSCOPIC
Bilirubin Urine: NEGATIVE
Glucose, UA: NEGATIVE mg/dL
HGB URINE DIPSTICK: NEGATIVE
KETONES UR: NEGATIVE mg/dL
Leukocytes, UA: NEGATIVE
Nitrite: NEGATIVE
Protein, ur: NEGATIVE mg/dL
Specific Gravity, Urine: 1.022 (ref 1.005–1.030)
UROBILINOGEN UA: 0.2 mg/dL (ref 0.0–1.0)
pH: 6 (ref 5.0–8.0)

## 2015-12-15 DIAGNOSIS — H905 Unspecified sensorineural hearing loss: Secondary | ICD-10-CM | POA: Insufficient documentation

## 2015-12-15 DIAGNOSIS — K219 Gastro-esophageal reflux disease without esophagitis: Secondary | ICD-10-CM | POA: Insufficient documentation

## 2015-12-15 DIAGNOSIS — R609 Edema, unspecified: Secondary | ICD-10-CM | POA: Insufficient documentation

## 2015-12-15 DIAGNOSIS — J309 Allergic rhinitis, unspecified: Secondary | ICD-10-CM | POA: Insufficient documentation

## 2015-12-15 DIAGNOSIS — M109 Gout, unspecified: Secondary | ICD-10-CM | POA: Insufficient documentation

## 2015-12-15 DIAGNOSIS — R7301 Impaired fasting glucose: Secondary | ICD-10-CM | POA: Insufficient documentation

## 2016-02-09 ENCOUNTER — Other Ambulatory Visit: Payer: Self-pay

## 2016-02-09 DIAGNOSIS — Z1231 Encounter for screening mammogram for malignant neoplasm of breast: Secondary | ICD-10-CM

## 2016-02-19 ENCOUNTER — Ambulatory Visit
Admission: RE | Admit: 2016-02-19 | Discharge: 2016-02-19 | Disposition: A | Discharge status: Home / Self Care | Payer: BC Managed Care – PPO | Source: Ambulatory Visit

## 2016-02-19 DIAGNOSIS — Z1231 Encounter for screening mammogram for malignant neoplasm of breast: Secondary | ICD-10-CM

## 2016-07-20 ENCOUNTER — Ambulatory Visit (INDEPENDENT_AMBULATORY_CARE_PROVIDER_SITE_OTHER): Payer: BC Managed Care – PPO | Admitting: Women's Health

## 2016-07-20 ENCOUNTER — Encounter: Payer: Self-pay | Admitting: Women's Health

## 2016-07-20 VITALS — BP 140/80 | Ht 63.0 in | Wt 242.0 lb

## 2016-07-20 DIAGNOSIS — Z01419 Encounter for gynecological examination (general) (routine) without abnormal findings: Secondary | ICD-10-CM | POA: Diagnosis not present

## 2016-07-20 NOTE — Patient Instructions (Addendum)
Basic Carbohydrate Counting for Diabetes Mellitus Carbohydrate counting is a method for keeping track of the amount of carbohydrates you eat. Eating carbohydrates naturally increases the level of sugar (glucose) in your blood, so it is important for you to know the amount that is okay for you to have in every meal. Carbohydrate counting helps keep the level of glucose in your blood within normal limits. The amount of carbohydrates allowed is different for every person. A dietitian can help you calculate the amount that is right for you. Once you know the amount of carbohydrates you can have, you can count the carbohydrates in the foods you want to eat. Carbohydrates are found in the following foods:  Grains, such as breads and cereals.  Dried beans and soy products.  Starchy vegetables, such as potatoes, peas, and corn.  Fruit and fruit juices.  Milk and yogurt.  Sweets and snack foods, such as cake, cookies, candy, chips, soft drinks, and fruit drinks. CARBOHYDRATE COUNTING There are two ways to count the carbohydrates in your food. You can use either of the methods or a combination of both. Reading the "Nutrition Facts" on [place] The "Nutrition Facts" is an area that is included on the labels of almost all packaged food and beverages in the [place]. It includes the serving size of that food or beverage and information about the nutrients in each serving of the food, including the grams (g) of carbohydrate per serving.  Decide the number of servings of this food or beverage that you will be able to eat or drink. Multiply that number of servings by the number of grams of carbohydrate that is listed on the label for that serving. The total will be the amount of carbohydrates you will be having when you eat or drink this food or beverage. Learning Standard Serving Sizes of Food When you eat food that is not packaged or does not include "Nutrition Facts" on the label, you need to  measure the servings in order to count the amount of carbohydrates.A serving of most carbohydrate-rich foods contains about 15 g of carbohydrates. The following list includes serving sizes of carbohydrate-rich foods that provide 15 g ofcarbohydrate per serving:   1 slice of bread (1 oz) or 1 six-inch tortilla.    of a hamburger bun or English muffin.  4-6 crackers.   cup unsweetened dry cereal.    cup hot cereal.   cup rice or pasta.    cup mashed potatoes or  of a large baked potato.  1 cup fresh fruit or one small piece of fruit.    cup canned or frozen fruit or fruit juice.  1 cup milk.   cup plain fat-free yogurt or yogurt sweetened with artificial sweeteners.   cup cooked dried beans or starchy vegetable, such as peas, corn, or potatoes.  Decide the number of standard-size servings that you will eat. Multiply that number of servings by 15 (the grams of carbohydrates in that serving). For example, if you eat 2 cups of strawberries, you will have eaten 2 servings and 30 g of carbohydrates (2 servings x 15 g = 30 g). For foods such as soups and casseroles, in which more than one food is mixed in, you will need to count the carbohydrates in each food that is included. EXAMPLE OF CARBOHYDRATE COUNTING Sample Dinner  3 oz chicken breast.   cup of brown rice.   cup of corn.  1 cup milk.   1 cup strawberries with  sugar-free whipped topping.  Carbohydrate Calculation Step 1: Identify the foods that contain carbohydrates:   Rice.   Corn.   Milk.   Strawberries. Step 2:Calculate the number of servings eaten of each:   2 servings of rice.   1 serving of corn.   1 serving of milk.   1 serving of strawberries. Step 3: Multiply each of those number of servings by 15 g:   2 servings of rice x 15 g = 30 g.   1 serving of corn x 15 g = 15 g.   1 serving of milk x 15 g = 15 g.   1 serving of strawberries x 15 g = 15 g. Step 4: Add  together all of the amounts to find the total grams of carbohydrates eaten: 30 g + 15 g + 15 g + 15 g = 75 g.   This information is not intended to replace advice given to you by your health care provider. Make sure you discuss any questions you have with your health care provider.   Document Released: 11/14/2005 Document Revised: 12/05/2014 Document Reviewed: 10/11/2013 Elsevier Interactive Patient Education [employer]. Menopause is a normal process in which your reproductive ability comes to an end. This process happens gradually over a span of months to years, usually between the ages of 64 and 37. Menopause is complete when you have missed 12 consecutive menstrual periods. It is important to talk with your health care provider about some of the most common conditions that affect postmenopausal women, such as heart disease, cancer, and bone loss (osteoporosis). Adopting a healthy lifestyle and getting preventive care can help to promote your health and wellness. Those actions can also lower your chances of developing some of these common conditions. WHAT SHOULD I KNOW ABOUT MENOPAUSE? During menopause, you may experience a number of symptoms, such as:  Moderate-to-severe hot flashes.  Night sweats.  Decrease in sex drive.  Mood swings.  Headaches.  Tiredness.  Irritability.  Memory problems.  Insomnia. Choosing to treat or not to treat menopausal changes is an individual decision that you make with your health care provider. WHAT SHOULD I KNOW ABOUT HORMONE REPLACEMENT THERAPY AND SUPPLEMENTS? Hormone therapy products are effective for treating symptoms that are associated with menopause, such as hot flashes and night sweats. Hormone replacement carries certain risks, especially as you become older. If you are thinking about using estrogen or estrogen with progestin treatments, discuss the benefits and risks with your health care provider. WHAT SHOULD I KNOW ABOUT HEART  DISEASE AND STROKE? Heart disease, heart attack, and stroke become more likely as you age. This may be due, in part, to the hormonal changes that your body experiences during menopause. These can affect how your body processes dietary fats, triglycerides, and cholesterol. Heart attack and stroke are both medical emergencies. There are many things that you can do to help prevent heart disease and stroke:  Have your blood pressure checked at least every 1-2 years. High blood pressure causes heart disease and increases the risk of stroke.  If you are 18-23 years old, ask your health care provider if you should take aspirin to prevent a heart attack or a stroke.  Do not use any tobacco products, including cigarettes, chewing tobacco, or electronic cigarettes. If you need help quitting, ask your health care provider.  It is important to eat a healthy diet and maintain a healthy weight.  Be sure to include plenty of vegetables, fruits, low-fat dairy products,  and lean protein.  Avoid eating foods that are high in solid fats, added sugars, or salt (sodium).  Get regular exercise. This is one of the most important things that you can do for your health.  Try to exercise for at least 150 minutes each week. The type of exercise that you do should increase your heart rate and make you sweat. This is known as moderate-intensity exercise.  Try to do strengthening exercises at least twice each week. Do these in addition to the moderate-intensity exercise.  Know your numbers.Ask your health care provider to check your cholesterol and your blood glucose. Continue to have your blood tested as directed by your health care provider. WHAT SHOULD I KNOW ABOUT CANCER SCREENING? There are several types of cancer. Take the following steps to reduce your risk and to catch any cancer development as early as possible. Breast Cancer  Practice breast self-awareness.  This means understanding how your breasts  normally appear and feel.  It also means doing regular breast self-exams. Let your health care provider know about any changes, no matter how small.  If you are 6 or older, have a clinician do a breast exam (clinical breast exam or CBE) every year. Depending on your age, family history, and medical history, it may be recommended that you also have a yearly breast X-ray (mammogram).  If you have a family history of breast cancer, talk with your health care provider about genetic screening.  If you are at high risk for breast cancer, talk with your health care provider about having an MRI and a mammogram every year.  Breast cancer (BRCA) gene test is recommended for women who have family members with BRCA-related cancers. Results of the assessment will determine the need for genetic counseling and BRCA1 and for BRCA2 testing. BRCA-related cancers include these types:  Breast. This occurs in males or females.  Ovarian.  Tubal. This may also be called fallopian tube cancer.  Cancer of the abdominal or pelvic lining (peritoneal cancer).  Prostate.  Pancreatic. Cervical, Uterine, and Ovarian Cancer Your health care provider may recommend that you be screened regularly for cancer of the pelvic organs. These include your ovaries, uterus, and vagina. This screening involves a pelvic exam, which includes checking for microscopic changes to the surface of your cervix (Pap test).  For women ages 21-65, health care providers may recommend a pelvic exam and a Pap test every three years. For women ages 23-65, they may recommend the Pap test and pelvic exam, combined with testing for human papilloma virus (HPV), every five years. Some types of HPV increase your risk of cervical cancer. Testing for HPV may also be done on women of any age who have unclear Pap test results.  Other health care providers may not recommend any screening for nonpregnant women who are considered low risk for pelvic cancer and  have no symptoms. Ask your health care provider if a screening pelvic exam is right for you.  If you have had past treatment for cervical cancer or a condition that could lead to cancer, you need Pap tests and screening for cancer for at least 20 years after your treatment. If Pap tests have been discontinued for you, your risk factors (such as having a new sexual partner) need to be reassessed to determine if you should start having screenings again. Some women have medical problems that increase the chance of getting cervical cancer. In these cases, your health care provider may recommend that you have screening  and Pap tests more often.  If you have a family history of uterine cancer or ovarian cancer, talk with your health care provider about genetic screening.  If you have vaginal bleeding after reaching menopause, tell your health care provider.  There are currently no reliable tests available to screen for ovarian cancer. Lung Cancer Lung cancer screening is recommended for adults 16-68 years old who are at high risk for lung cancer because of a history of smoking. A yearly low-dose CT scan of the lungs is recommended if you:  Currently smoke.  Have a history of at least 30 pack-years of smoking and you currently smoke or have quit within the past 15 years. A pack-year is smoking an average of one pack of cigarettes per day for one year. Yearly screening should:  Continue until it has been 15 years since you quit.  Stop if you develop a health problem that would prevent you from having lung cancer treatment. Colorectal Cancer  This type of cancer can be detected and can often be prevented.  Routine colorectal cancer screening usually begins at age 39 and continues through age 67.  If you have risk factors for colon cancer, your health care provider may recommend that you be screened at an earlier age.  If you have a family history of colorectal cancer, talk with your health care  provider about genetic screening.  Your health care provider may also recommend using home test kits to check for hidden blood in your stool.  A small camera at the end of a tube can be used to examine your colon directly (sigmoidoscopy or colonoscopy). This is done to check for the earliest forms of colorectal cancer.  Direct examination of the colon should be repeated every 5-10 years until age 25. However, if early forms of precancerous polyps or small growths are found or if you have a family history or genetic risk for colorectal cancer, you may need to be screened more often. Skin Cancer  Check your skin from head to toe regularly.  Monitor any moles. Be sure to tell your health care provider:  About any new moles or changes in moles, especially if there is a change in a mole's shape or color.  If you have a mole that is larger than the size of a pencil eraser.  If any of your family members has a history of skin cancer, especially at a [place] age, talk with your health care provider about genetic screening.  Always use sunscreen. Apply sunscreen liberally and repeatedly throughout the day.  Whenever you are outside, protect yourself by wearing long sleeves, pants, a wide-brimmed hat, and sunglasses. WHAT SHOULD I KNOW ABOUT OSTEOPOROSIS? Osteoporosis is a condition in which bone destruction happens more quickly than new bone creation. After menopause, you may be at an increased risk for osteoporosis. To help prevent osteoporosis or the bone fractures that can happen because of osteoporosis, the following is recommended:  If you are 82-28 years old, get at least 1,000 mg of calcium and at least 600 mg of vitamin D per day.  If you are older than age 4 but younger than age 64, get at least 1,200 mg of calcium and at least 600 mg of vitamin D per day.  If you are older than age 73, get at least 1,200 mg of calcium and at least 800 mg of vitamin D per day. Smoking and excessive  alcohol intake increase the risk of osteoporosis. Eat foods that are rich  in calcium and vitamin D, and do weight-bearing exercises several times each week as directed by your health care provider. WHAT SHOULD I KNOW ABOUT HOW MENOPAUSE AFFECTS [place]? Depression may occur at any age, but it is more common as you become older. Common symptoms of depression include:  Low or sad mood.  Changes in sleep patterns.  Changes in appetite or eating patterns.  Feeling an overall lack of motivation or enjoyment of activities that you previously enjoyed.  Frequent crying spells. Talk with your health care provider if you think that you are experiencing depression. WHAT SHOULD I KNOW ABOUT IMMUNIZATIONS? It is important that you get and maintain your immunizations. These include:  Tetanus, diphtheria, and pertussis (Tdap) booster vaccine.  Influenza every year before the flu season begins.  Pneumonia vaccine.  Shingles vaccine. Your health care provider may also recommend other immunizations.   This information is not intended to replace advice given to you by your health care provider. Make sure you discuss any questions you have with your health care provider.   Document Released: 01/06/2006 Document Revised: 12/05/2014 Document Reviewed: 07/17/2014 Elsevier Interactive Patient Education [employer].

## 2016-07-20 NOTE — Progress Notes (Signed)
Becky Cox 24-Jun-1951 [id number]    History:    Presents for annual exam.  1996 TAH for fibroids. Normal Pap and mammogram history. Hypertension and hypothyroidism managed by primary care. 2013 benign colon polyp 5 year follow-up. 2015 normal bone density at primary care. 2015 had a benign growth on her tonsil causing a tonsillectomy. Received Zostavax.  Past medical history, past surgical history, family history and social history were all reviewed and documented in the EPIC chart. Retired from [employer], continues to work about 10 hours per week. sister with breast cancer, another sister with colon cancer both survivors. Has started doing water aerobics this past year 3 times weekly. Mother Alzheimer's.  ROS:  A ROS was performed and pertinent positives and negatives are included.  Exam:  Vitals:   07/20/16 1429  BP: 140/80  Weight: 242 lb (109.8 kg)  Height: 5\' 3"  (1.6 m)   Body mass index is 42.87 kg/m.   General appearance:  Normal Thyroid:  Symmetrical, normal in size, without palpable masses or nodularity. Respiratory  Auscultation:  Clear without wheezing or rhonchi Cardiovascular  Auscultation:  Regular rate, without rubs, murmurs or gallops  Edema/varicosities:  Not grossly evident Abdominal  Soft,nontender, without masses, guarding or rebound.  Liver/spleen:  No organomegaly noted  Hernia:  None appreciated  Skin  Inspection:  Grossly normal   Breasts: Examined lying and sitting.     Right: Without masses, retractions, discharge or axillary adenopathy.     Left: Without masses, retractions, discharge or axillary adenopathy. Gentitourinary   Inguinal/mons:  Normal without inguinal adenopathy  External genitalia:  Normal  BUS/Urethra/Skene's glands:  Normal  Vagina:  Normal  Cervix:  Uterus absent Adnexa/parametria:     Rt: Without masses or tenderness.   Lt: Without masses or tenderness.  Anus and perineum: Normal  Digital rectal  exam: Normal sphincter tone without palpated masses or tenderness  Assessment/Plan:  65 y.o. SBF G2 P2 for annual exam with no complaints.  1996 TAH for fibroids no HRT Hypertension and hypothyroidism managed by primary care labs and meds Obesity  Plan: SBE's, continue annual screening mammogram 3-D tomography reviewed and encouraged history of dense breasts. Reviewed importance of continuing regular exercise, decreasing carbohydrates in diet for weight loss, calcium rich foods, vitamin D 1000 daily encouraged. Aware of need for repeat colonoscopy next year. UA. Pneumovax at 65 recommended.    Huel Cote [facility], 2:55 PM 07/20/2016

## 2016-07-21 LAB — URINALYSIS W MICROSCOPIC + REFLEX CULTURE
BILIRUBIN URINE: NEGATIVE
Bacteria, UA: NONE SEEN [HPF]
Casts: NONE SEEN [LPF]
Crystals: NONE SEEN [HPF]
Glucose, UA: NEGATIVE
Hgb urine dipstick: NEGATIVE
Ketones, ur: NEGATIVE
Leukocytes, UA: NEGATIVE
NITRITE: NEGATIVE
PH: 5 (ref 5.0–8.0)
Protein, ur: NEGATIVE
SPECIFIC GRAVITY, URINE: 1.022 (ref 1.001–1.035)
Yeast: NONE SEEN [HPF]

## 2016-07-22 LAB — URINE CULTURE

## 2017-01-18 ENCOUNTER — Other Ambulatory Visit: Payer: Self-pay | Admitting: Family Medicine

## 2017-01-18 DIAGNOSIS — Z1231 Encounter for screening mammogram for malignant neoplasm of breast: Secondary | ICD-10-CM

## 2017-02-20 ENCOUNTER — Ambulatory Visit
Admission: RE | Admit: 2017-02-20 | Discharge: 2017-02-20 | Disposition: A | Discharge status: Home / Self Care | Payer: BC Managed Care – PPO | Source: Ambulatory Visit | Attending: Family Medicine | Admitting: Family Medicine

## 2017-02-20 DIAGNOSIS — Z1231 Encounter for screening mammogram for malignant neoplasm of breast: Secondary | ICD-10-CM

## 2017-03-13 DIAGNOSIS — L603 Nail dystrophy: Secondary | ICD-10-CM | POA: Insufficient documentation

## 2017-03-13 DIAGNOSIS — G8929 Other chronic pain: Secondary | ICD-10-CM | POA: Insufficient documentation

## 2017-04-12 ENCOUNTER — Encounter: Payer: Self-pay | Admitting: Gynecology

## 2017-06-07 ENCOUNTER — Ambulatory Visit (INDEPENDENT_AMBULATORY_CARE_PROVIDER_SITE_OTHER): Payer: Medicare Other | Admitting: Podiatry

## 2017-06-07 ENCOUNTER — Ambulatory Visit (INDEPENDENT_AMBULATORY_CARE_PROVIDER_SITE_OTHER): Payer: Medicare Other

## 2017-06-07 ENCOUNTER — Encounter: Payer: Self-pay | Admitting: Podiatry

## 2017-06-07 DIAGNOSIS — M7752 Other enthesopathy of left foot: Secondary | ICD-10-CM

## 2017-06-07 DIAGNOSIS — M79672 Pain in left foot: Secondary | ICD-10-CM

## 2017-06-07 DIAGNOSIS — M79671 Pain in right foot: Secondary | ICD-10-CM

## 2017-06-07 DIAGNOSIS — M21619 Bunion of unspecified foot: Secondary | ICD-10-CM | POA: Diagnosis not present

## 2017-06-07 DIAGNOSIS — M779 Enthesopathy, unspecified: Secondary | ICD-10-CM | POA: Diagnosis not present

## 2017-06-07 DIAGNOSIS — M7751 Other enthesopathy of right foot: Secondary | ICD-10-CM | POA: Diagnosis not present

## 2017-06-07 MED ORDER — TRIAMCINOLONE ACETONIDE 10 MG/ML IJ SUSP
10.0000 mg | Freq: Once | INTRAMUSCULAR | Status: AC
  Administered 2017-06-07: 10 mg

## 2017-06-07 NOTE — Progress Notes (Signed)
Subjective:    Patient ID: Becky Cox, female   DOB: 66 y.o.   MRN: [id number]   HPI patient presents stating she's developed a lot of pain on the outside of both feet over the last few months not sure what she may have done it has structural bunion right that she no she needs to get corrected like the left    Review of Systems  All other systems reviewed and are negative.       Objective:  Physical Exam  Cardiovascular: Intact distal pulses.   Musculoskeletal: Normal range of motion.  Neurological: She is alert.  Skin: Skin is warm.  Nursing note and vitals reviewed.  neurovascular status intact muscle strength adequate with patient found to have exquisite inflammation around the peroneal insertion base of fifth metatarsal bilateral with fluid buildup noted and pain when palpated. Structural bunion deformity noted right correction of left noted     Assessment:    Tendinitis of the lateral side of both feet with inflammation fluid buildup peroneal insertion base of fifth metatarsal structural bunion deformity right with corrected bunion left     Plan:    H&P conditions reviewed and I carefully injected the sheath of the tendon bilateral 3 mg Kenalog 5 mill grams Xylocaine at insertion base of fifth metatarsal. Advised on ice therapy reduced activity and discussed structural bunion correction right at one point in future  X-rays indicate that the osteotomy left looks good with screws in place second third metatarsal and the right foot shows bunion formation with no pathology base of fifth metatarsal

## 2017-06-07 NOTE — Progress Notes (Signed)
   Subjective:    Patient ID: Becky Cox, female    DOB: [birth year], 66 y.o.   MRN: [id number]  HPI  Chief Complaint  Patient presents with  . Foot Pain      est pt last seen 3+ yrs/bil foot pain on sides of feet     Review of Systems  Musculoskeletal: Positive for gait problem.       Objective:   Physical Exam        Assessment & Plan:

## 2017-06-15 ENCOUNTER — Ambulatory Visit: Payer: Medicare Other | Admitting: Podiatry

## 2017-06-22 ENCOUNTER — Ambulatory Visit (INDEPENDENT_AMBULATORY_CARE_PROVIDER_SITE_OTHER): Payer: Medicare Other | Admitting: Podiatry

## 2017-06-22 DIAGNOSIS — M779 Enthesopathy, unspecified: Secondary | ICD-10-CM

## 2017-06-22 DIAGNOSIS — M7752 Other enthesopathy of left foot: Secondary | ICD-10-CM | POA: Diagnosis not present

## 2017-06-22 DIAGNOSIS — T148XXA Other injury of unspecified body region, initial encounter: Secondary | ICD-10-CM

## 2017-06-22 MED ORDER — DICLOFENAC SODIUM 75 MG PO TBEC: 75.0000 mg | DELAYED_RELEASE_TABLET | Freq: Two times a day (BID) | ORAL | 2 refills | Status: DC

## 2017-06-23 NOTE — Progress Notes (Signed)
Subjective:    Patient ID: Becky Cox, female   DOB: 66 y.o.   MRN: [id number]   HPI patient states that she is still having a lot of pain in the outside left foot and that it continues to make it difficult for her to walk    ROS      Objective:  Physical Exam neurovascular status intact with inflammation pain around the lateral side left foot base of fifth metatarsal with fluid buildup     Assessment:    Tendinitis noted left with inflammation fluid around the base of fifth metatarsal     Plan:    Reviewed condition and recommended immobilization with boot. Patient's placed an air fracture walker will be seen back to recheck in the next 4 weeks

## 2017-07-13 ENCOUNTER — Ambulatory Visit (INDEPENDENT_AMBULATORY_CARE_PROVIDER_SITE_OTHER): Payer: Medicare Other | Admitting: Podiatry

## 2017-07-13 ENCOUNTER — Encounter: Payer: Self-pay | Admitting: Podiatry

## 2017-07-13 DIAGNOSIS — M779 Enthesopathy, unspecified: Secondary | ICD-10-CM | POA: Diagnosis not present

## 2017-07-13 DIAGNOSIS — T148XXA Other injury of unspecified body region, initial encounter: Secondary | ICD-10-CM

## 2017-07-14 NOTE — Progress Notes (Signed)
Subjective:    Patient ID: Becky Cox, female   DOB: 66 y.o.   MRN: [id number]   HPI patient presents stating that my foot feeling quite a bit better with the boot on but I still have mild discomfort but improved    ROS      Objective:  Physical Exam neurovascular status intact with patient's left lateral foot found to be reduced as far as discomfort with mild edema still noted but what appears to be functioning peroneal tendon     Assessment:   Cannot rule out the possibility for tear the tendon but it appears at this point I responded to immobilization      Plan:    Reviewed condition and at this point I recommended continued ice therapy continued immobilization with boot and anti-inflammatories. If symptoms were to come back working the need to get MRI of this

## 2017-07-21 ENCOUNTER — Encounter: Payer: BC Managed Care – PPO | Admitting: Women's Health

## 2017-07-24 ENCOUNTER — Encounter: Payer: Self-pay | Admitting: Women's Health

## 2017-07-24 ENCOUNTER — Ambulatory Visit (INDEPENDENT_AMBULATORY_CARE_PROVIDER_SITE_OTHER): Payer: Medicare Other | Admitting: Women's Health

## 2017-07-24 VITALS — BP 138/80 | Ht 63.0 in | Wt 245.0 lb

## 2017-07-24 DIAGNOSIS — Z01419 Encounter for gynecological examination (general) (routine) without abnormal findings: Secondary | ICD-10-CM

## 2017-07-24 NOTE — Patient Instructions (Signed)
Carbohydrate Counting for Diabetes Mellitus, Adult Carbohydrate counting is a method for keeping track of how many carbohydrates you eat. Eating carbohydrates naturally increases the amount of sugar (glucose) in the blood. Counting how many carbohydrates you eat helps keep your blood glucose within normal limits, which helps you manage your diabetes (diabetes mellitus). It is important to know how many carbohydrates you can safely have in each meal. This is different for every person. A diet and nutrition specialist (registered dietitian) can help you make a meal plan and calculate how many carbohydrates you should have at each meal and snack. Carbohydrates are found in the following foods:  Grains, such as breads and cereals.  Dried beans and soy products.  Starchy vegetables, such as potatoes, peas, and corn.  Fruit and fruit juices.  Milk and yogurt.  Sweets and snack foods, such as cake, cookies, candy, chips, and soft drinks.  How do I count carbohydrates? There are two ways to count carbohydrates in food. You can use either of the methods or a combination of both. Reading "Nutrition Facts" on packaged food The "Nutrition Facts" list is included on the labels of almost all packaged foods and beverages in the [place] It includes:  The serving size.  Information about nutrients in each serving, including the grams (g) of carbohydrate per serving.  To use the "Nutrition Facts":  Decide how many servings you will have.  Multiply the number of servings by the number of carbohydrates per serving.  The resulting number is the total amount of carbohydrates that you will be having.  Learning standard serving sizes of other foods When you eat foods containing carbohydrates that are not packaged or do not include "Nutrition Facts" on the label, you need to measure the servings in order to count the amount of carbohydrates:  Measure the foods that you will eat with a food scale or  measuring cup, if needed.  Decide how many standard-size servings you will eat.  Multiply the number of servings by 15. Most carbohydrate-rich foods have about 15 g of carbohydrates per serving. ? For example, if you eat 8 oz (170 g) of strawberries, you will have eaten 2 servings and 30 g of carbohydrates (2 servings x 15 g = 30 g).  For foods that have more than one food mixed, such as soups and casseroles, you must count the carbohydrates in each food that is included.  The following list contains standard serving sizes of common carbohydrate-rich foods. Each of these servings has about 15 g of carbohydrates:   hamburger bun or  English muffin.   oz (15 mL) syrup.   oz (14 g) jelly.  1 slice of bread.  1 six-inch tortilla.  3 oz (85 g) cooked rice or pasta.  4 oz (113 g) cooked dried beans.  4 oz (113 g) starchy vegetable, such as peas, corn, or potatoes.  4 oz (113 g) hot cereal.  4 oz (113 g) mashed potatoes or  of a large baked potato.  4 oz (113 g) canned or frozen fruit.  4 oz (120 mL) fruit juice.  4-6 crackers.  6 chicken nuggets.  6 oz (170 g) unsweetened dry cereal.  6 oz (170 g) plain fat-free yogurt or yogurt sweetened with artificial sweeteners.  8 oz (240 mL) milk.  8 oz (170 g) fresh fruit or one small piece of fruit.  24 oz (680 g) popped popcorn.  Example of carbohydrate counting Sample meal  3 oz (85 g) chicken breast.    6 oz (170 g) brown rice.  4 oz (113 g) corn.  8 oz (240 mL) milk.  8 oz (170 g) strawberries with sugar-free whipped topping. Carbohydrate calculation 1. Identify the foods that contain carbohydrates: ? Rice. ? Corn. ? Milk. ? Strawberries. 2. Calculate how many servings you have of each food: ? 2 servings rice. ? 1 serving corn. ? 1 serving milk. ? 1 serving strawberries. 3. Multiply each number of servings by 15 g: ? 2 servings rice x 15 g = 30 g. ? 1 serving corn x 15 g = 15 g. ? 1 serving milk x 15  g = 15 g. ? 1 serving strawberries x 15 g = 15 g. 4. Add together all of the amounts to find the total grams of carbohydrates eaten: ? 30 g + 15 g + 15 g + 15 g = 75 g of carbohydrates total. This information is not intended to replace advice given to you by your health care provider. Make sure you discuss any questions you have with your health care provider. Document Released: 11/14/2005 Document Revised: 06/03/2016 Document Reviewed: 04/27/2016 Elsevier Interactive Patient Education  2018 [employer] Health Maintenance for Postmenopausal Women Menopause is a normal process in which your reproductive ability comes to an end. This process happens gradually over a span of months to years, usually between the ages of 48 and 55. Menopause is complete when you have missed 12 consecutive menstrual periods. It is important to talk with your health care provider about some of the most common conditions that affect postmenopausal women, such as heart disease, cancer, and bone loss (osteoporosis). Adopting a healthy lifestyle and getting preventive care can help to promote your health and wellness. Those actions can also lower your chances of developing some of these common conditions. What should I know about menopause? During menopause, you may experience a number of symptoms, such as:  Moderate-to-severe hot flashes.  Night sweats.  Decrease in sex drive.  Mood swings.  Headaches.  Tiredness.  Irritability.  Memory problems.  Insomnia.  Choosing to treat or not to treat menopausal changes is an individual decision that you make with your health care provider. What should I know about hormone replacement therapy and supplements? Hormone therapy products are effective for treating symptoms that are associated with menopause, such as hot flashes and night sweats. Hormone replacement carries certain risks, especially as you become older. If you are thinking about using estrogen or estrogen  with progestin treatments, discuss the benefits and risks with your health care provider. What should I know about heart disease and stroke? Heart disease, heart attack, and stroke become more likely as you age. This may be due, in part, to the hormonal changes that your body experiences during menopause. These can affect how your body processes dietary fats, triglycerides, and cholesterol. Heart attack and stroke are both medical emergencies. There are many things that you can do to help prevent heart disease and stroke:  Have your blood pressure checked at least every 1-2 years. High blood pressure causes heart disease and increases the risk of stroke.  If you are 55-79 years old, ask your health care provider if you should take aspirin to prevent a heart attack or a stroke.  Do not use any tobacco products, including cigarettes, chewing tobacco, or electronic cigarettes. If you need help quitting, ask your health care provider.  It is important to eat a healthy diet and maintain a healthy weight. ? Be sure to include   plenty of vegetables, fruits, low-fat dairy products, and lean protein. ? Avoid eating foods that are high in solid fats, added sugars, or salt (sodium).  Get regular exercise. This is one of the most important things that you can do for your health. ? Try to exercise for at least 150 minutes each week. The type of exercise that you do should increase your heart rate and make you sweat. This is known as moderate-intensity exercise. ? Try to do strengthening exercises at least twice each week. Do these in addition to the moderate-intensity exercise.  Know your numbers.Ask your health care provider to check your cholesterol and your blood glucose. Continue to have your blood tested as directed by your health care provider.  What should I know about cancer screening? There are several types of cancer. Take the following steps to reduce your risk and to catch any cancer development  as early as possible. Breast Cancer  Practice breast self-awareness. ? This means understanding how your breasts normally appear and feel. ? It also means doing regular breast self-exams. Let your health care provider know about any changes, no matter how small.  If you are 40 or older, have a clinician do a breast exam (clinical breast exam or CBE) every year. Depending on your age, family history, and medical history, it may be recommended that you also have a yearly breast X-ray (mammogram).  If you have a family history of breast cancer, talk with your health care provider about genetic screening.  If you are at high risk for breast cancer, talk with your health care provider about having an MRI and a mammogram every year.  Breast cancer (BRCA) gene test is recommended for women who have family members with BRCA-related cancers. Results of the assessment will determine the need for genetic counseling and BRCA1 and for BRCA2 testing. BRCA-related cancers include these types: ? Breast. This occurs in males or females. ? Ovarian. ? Tubal. This may also be called fallopian tube cancer. ? Cancer of the abdominal or pelvic lining (peritoneal cancer). ? Prostate. ? Pancreatic.  Cervical, Uterine, and Ovarian Cancer Your health care provider may recommend that you be screened regularly for cancer of the pelvic organs. These include your ovaries, uterus, and vagina. This screening involves a pelvic exam, which includes checking for microscopic changes to the surface of your cervix (Pap test).  For women ages 21-65, health care providers may recommend a pelvic exam and a Pap test every three years. For women ages 30-65, they may recommend the Pap test and pelvic exam, combined with testing for human papilloma virus (HPV), every five years. Some types of HPV increase your risk of cervical cancer. Testing for HPV may also be done on women of any age who have unclear Pap test results.  Other health  care providers may not recommend any screening for nonpregnant women who are considered low risk for pelvic cancer and have no symptoms. Ask your health care provider if a screening pelvic exam is right for you.  If you have had past treatment for cervical cancer or a condition that could lead to cancer, you need Pap tests and screening for cancer for at least 20 years after your treatment. If Pap tests have been discontinued for you, your risk factors (such as having a new sexual partner) need to be reassessed to determine if you should start having screenings again. Some women have medical problems that increase the chance of getting cervical cancer. In these cases, your   health care provider may recommend that you have screening and Pap tests more often.  If you have a family history of uterine cancer or ovarian cancer, talk with your health care provider about genetic screening.  If you have vaginal bleeding after reaching menopause, tell your health care provider.  There are currently no reliable tests available to screen for ovarian cancer.  Lung Cancer Lung cancer screening is recommended for adults 55-80 years old who are at high risk for lung cancer because of a history of smoking. A yearly low-dose CT scan of the lungs is recommended if you:  Currently smoke.  Have a history of at least 30 pack-years of smoking and you currently smoke or have quit within the past 15 years. A pack-year is smoking an average of one pack of cigarettes per day for one year.  Yearly screening should:  Continue until it has been 15 years since you quit.  Stop if you develop a health problem that would prevent you from having lung cancer treatment.  Colorectal Cancer  This type of cancer can be detected and can often be prevented.  Routine colorectal cancer screening usually begins at age 50 and continues through age 75.  If you have risk factors for colon cancer, your health care provider may  recommend that you be screened at an earlier age.  If you have a family history of colorectal cancer, talk with your health care provider about genetic screening.  Your health care provider may also recommend using home test kits to check for hidden blood in your stool.  A small camera at the end of a tube can be used to examine your colon directly (sigmoidoscopy or colonoscopy). This is done to check for the earliest forms of colorectal cancer.  Direct examination of the colon should be repeated every 5-10 years until age 75. However, if early forms of precancerous polyps or small growths are found or if you have a family history or genetic risk for colorectal cancer, you may need to be screened more often.  Skin Cancer  Check your skin from head to toe regularly.  Monitor any moles. Be sure to tell your health care provider: ? About any new moles or changes in moles, especially if there is a change in a mole's shape or color. ? If you have a mole that is larger than the size of a pencil eraser.  If any of your family members has a history of skin cancer, especially at a Kleo Dungee age, talk with your health care provider about genetic screening.  Always use sunscreen. Apply sunscreen liberally and repeatedly throughout the day.  Whenever you are outside, protect yourself by wearing long sleeves, pants, a wide-brimmed hat, and sunglasses.  What should I know about osteoporosis? Osteoporosis is a condition in which bone destruction happens more quickly than new bone creation. After menopause, you may be at an increased risk for osteoporosis. To help prevent osteoporosis or the bone fractures that can happen because of osteoporosis, the following is recommended:  If you are 19-50 years old, get at least 1,000 mg of calcium and at least 600 mg of vitamin D per day.  If you are older than age 50 but younger than age 70, get at least 1,200 mg of calcium and at least 600 mg of vitamin D per  day.  If you are older than age 70, get at least 1,200 mg of calcium and at least 800 mg of vitamin D per day.    Smoking and excessive alcohol intake increase the risk of osteoporosis. Eat foods that are rich in calcium and vitamin D, and do weight-bearing exercises several times each week as directed by your health care provider. What should I know about how menopause affects my mental health? Depression may occur at any age, but it is more common as you become older. Common symptoms of depression include:  Low or sad mood.  Changes in sleep patterns.  Changes in appetite or eating patterns.  Feeling an overall lack of motivation or enjoyment of activities that you previously enjoyed.  Frequent crying spells.  Talk with your health care provider if you think that you are experiencing depression. What should I know about immunizations? It is important that you get and maintain your immunizations. These include:  Tetanus, diphtheria, and pertussis (Tdap) booster vaccine.  Influenza every year before the flu season begins.  Pneumonia vaccine.  Shingles vaccine.  Your health care provider may also recommend other immunizations. This information is not intended to replace advice given to you by your health care provider. Make sure you discuss any questions you have with your health care provider. Document Released: 01/06/2006 Document Revised: 06/03/2016 Document Reviewed: 08/18/2015 Elsevier Interactive Patient Education  2018 [employer].

## 2017-07-24 NOTE — Progress Notes (Signed)
Becky Cox 12/11/1950 [id number]    History:    Presents for breast and pelvic exam. 1996 TAH for fibroids/no HRT. Normal Pap and mammogram history.  Hypertension and hypothyroidism managed by primary care. 2015 normal DEXA. 01/2017 2 benign polyps 5 year follow-up. Current on vaccines. Not sexually active in years  Past medical history, past surgical history, family history and social history were all reviewed and documented in the EPIC chart. Works 2 days a week at [employer]. Mother Alzheimer's. Doing water aerobics 3 times weekly.  ROS:  A ROS was performed and pertinent positives and negatives are included.  Exam:  Vitals:   07/24/17 1056  BP: 138/80  Weight: 245 lb (111.1 kg)  Height: 5\' 3"  (1.6 m)   Body mass index is 43.4 kg/m.   General appearance:  Normal Thyroid:  Symmetrical, normal in size, without palpable masses or nodularity. Respiratory  Auscultation:  Clear without wheezing or rhonchi Cardiovascular  Auscultation:  Regular rate, without rubs, murmurs or gallops  Edema/varicosities:  Not grossly evident Abdominal  Soft,nontender, without masses, guarding or rebound.  Liver/spleen:  No organomegaly noted  Hernia:  None appreciated  Skin  Inspection:  Grossly normal   Breasts: Examined lying and sitting.     Right: Without masses, retractions, discharge or axillary adenopathy.     Left: Without masses, retractions, discharge or axillary adenopathy. Gentitourinary   Inguinal/mons:  Normal without inguinal adenopathy  External genitalia:  Normal  BUS/Urethra/Skene's glands:  Normal  Vagina:  +1 rectocele/asymptomatic  Cervix:  Uterus absent  Adnexa/parametria:     Rt: Without masses or tenderness.   Lt: Without masses or tenderness.  Anus and perineum: Normal  Digital rectal exam: Normal sphincter tone without palpated masses or tenderness  Assessment/Plan:  66 y.o. SBF G2 P2 for breast and pelvic exam with no complaints.  TAH for fibroids on no  HRT 01/2017 2 benign colon polyps 5 year follow-up Obesity Hypertension/hyperthyroidism-primary care manages labs and meds  Plan: SBE's, continue annual screening mammogram, calcium rich diet, vitamin D 2000 daily encouraged. Reviewed importance of weightbearing exercise, continue water aerobics, home safety, fall prevention. Reviewed importance of decreasing calories and increasing exercise, low carb and calorie diet for weight loss encouraged.    [place], 12:30 PM 07/24/2017

## 2017-08-22 DIAGNOSIS — E785 Hyperlipidemia, unspecified: Secondary | ICD-10-CM | POA: Insufficient documentation

## 2017-11-28 HISTORY — PX: BREAST BIOPSY: SHX20

## 2018-02-12 ENCOUNTER — Other Ambulatory Visit: Payer: Self-pay | Admitting: Family Medicine

## 2018-02-12 DIAGNOSIS — Z1231 Encounter for screening mammogram for malignant neoplasm of breast: Secondary | ICD-10-CM

## 2018-03-01 ENCOUNTER — Ambulatory Visit
Admission: RE | Admit: 2018-03-01 | Discharge: 2018-03-01 | Disposition: A | Discharge status: Home / Self Care | Payer: BC Managed Care – PPO | Source: Ambulatory Visit | Attending: Family Medicine | Admitting: Family Medicine

## 2018-03-01 DIAGNOSIS — Z1231 Encounter for screening mammogram for malignant neoplasm of breast: Secondary | ICD-10-CM

## 2018-03-02 ENCOUNTER — Other Ambulatory Visit: Payer: Self-pay | Admitting: Family Medicine

## 2018-03-02 DIAGNOSIS — R928 Other abnormal and inconclusive findings on diagnostic imaging of breast: Secondary | ICD-10-CM

## 2018-03-07 ENCOUNTER — Ambulatory Visit
Admission: RE | Admit: 2018-03-07 | Discharge: 2018-03-07 | Disposition: A | Discharge status: Home / Self Care | Payer: Medicare Other | Source: Ambulatory Visit | Attending: Family Medicine | Admitting: Family Medicine

## 2018-03-07 ENCOUNTER — Other Ambulatory Visit: Payer: Self-pay | Admitting: Family Medicine

## 2018-03-07 DIAGNOSIS — R928 Other abnormal and inconclusive findings on diagnostic imaging of breast: Secondary | ICD-10-CM

## 2018-03-12 ENCOUNTER — Other Ambulatory Visit: Payer: Self-pay | Admitting: Family Medicine

## 2018-03-12 ENCOUNTER — Ambulatory Visit
Admission: RE | Admit: 2018-03-12 | Discharge: 2018-03-12 | Disposition: A | Discharge status: Home / Self Care | Payer: Medicare Other | Source: Ambulatory Visit | Attending: Family Medicine | Admitting: Family Medicine

## 2018-03-12 DIAGNOSIS — R928 Other abnormal and inconclusive findings on diagnostic imaging of breast: Secondary | ICD-10-CM

## 2018-09-12 ENCOUNTER — Ambulatory Visit: Payer: Medicare Other | Admitting: Women's Health

## 2018-09-12 ENCOUNTER — Encounter: Payer: Self-pay | Admitting: Women's Health

## 2018-09-12 VITALS — BP 132/80 | Ht 63.0 in | Wt 246.0 lb

## 2018-09-12 DIAGNOSIS — Z01419 Encounter for gynecological examination (general) (routine) without abnormal findings: Secondary | ICD-10-CM

## 2018-09-12 NOTE — Patient Instructions (Addendum)
Health Maintenance for Postmenopausal Women Menopause is a normal process in which your reproductive ability comes to an end. This process happens gradually over a span of months to years, usually between the ages of 22 and 9. Menopause is complete when you have missed 12 consecutive menstrual periods. It is important to talk with your health care provider about some of the most common conditions that affect postmenopausal women, such as heart disease, cancer, and bone loss (osteoporosis). Adopting a healthy lifestyle and getting preventive care can help to promote your health and wellness. Those actions can also lower your chances of developing some of these common conditions. What should I know about menopause? During menopause, you may experience a number of symptoms, such as:  Moderate-to-severe hot flashes.  Night sweats.  Decrease in sex drive.  Mood swings.  Headaches.  Tiredness.  Irritability.  Memory problems.  Insomnia.  Choosing to treat or not to treat menopausal changes is an individual decision that you make with your health care provider. What should I know about hormone replacement therapy and supplements? Hormone therapy products are effective for treating symptoms that are associated with menopause, such as hot flashes and night sweats. Hormone replacement carries certain risks, especially as you become older. If you are thinking about using estrogen or estrogen with progestin treatments, discuss the benefits and risks with your health care provider. What should I know about heart disease and stroke? Heart disease, heart attack, and stroke become more likely as you age. This may be due, in part, to the hormonal changes that your body experiences during menopause. These can affect how your body processes dietary fats, triglycerides, and cholesterol. Heart attack and stroke are both medical emergencies. There are many things that you can do to help prevent heart disease  and stroke:  Have your blood pressure checked at least every 1-2 years. High blood pressure causes heart disease and increases the risk of stroke.  If you are 53-22 years old, ask your health care provider if you should take aspirin to prevent a heart attack or a stroke.  Do not use any tobacco products, including cigarettes, chewing tobacco, or electronic cigarettes. If you need help quitting, ask your health care provider.  It is important to eat a healthy diet and maintain a healthy weight. ? Be sure to include plenty of vegetables, fruits, low-fat dairy products, and lean protein. ? Avoid eating foods that are high in solid fats, added sugars, or salt (sodium).  Get regular exercise. This is one of the most important things that you can do for your health. ? Try to exercise for at least 150 minutes each week. The type of exercise that you do should increase your heart rate and make you sweat. This is known as moderate-intensity exercise. ? Try to do strengthening exercises at least twice each week. Do these in addition to the moderate-intensity exercise.  Know your numbers.Ask your health care provider to check your cholesterol and your blood glucose. Continue to have your blood tested as directed by your health care provider.  What should I know about cancer screening? There are several types of cancer. Take the following steps to reduce your risk and to catch any cancer development as early as possible. Breast Cancer  Practice breast self-awareness. ? This means understanding how your breasts normally appear and feel. ? It also means doing regular breast self-exams. Let your health care provider know about any changes, no matter how small.  If you are 40  or older, have a clinician do a breast exam (clinical breast exam or CBE) every year. Depending on your age, family history, and medical history, it may be recommended that you also have a yearly breast X-ray (mammogram).  If you  have a family history of breast cancer, talk with your health care provider about genetic screening.  If you are at high risk for breast cancer, talk with your health care provider about having an MRI and a mammogram every year.  Breast cancer (BRCA) gene test is recommended for women who have family members with BRCA-related cancers. Results of the assessment will determine the need for genetic counseling and BRCA1 and for BRCA2 testing. BRCA-related cancers include these types: ? Breast. This occurs in males or females. ? Ovarian. ? Tubal. This may also be called fallopian tube cancer. ? Cancer of the abdominal or pelvic lining (peritoneal cancer). ? Prostate. ? Pancreatic.  Cervical, Uterine, and Ovarian Cancer Your health care provider may recommend that you be screened regularly for cancer of the pelvic organs. These include your ovaries, uterus, and vagina. This screening involves a pelvic exam, which includes checking for microscopic changes to the surface of your cervix (Pap test).  For women ages 21-65, health care providers may recommend a pelvic exam and a Pap test every three years. For women ages 79-65, they may recommend the Pap test and pelvic exam, combined with testing for human papilloma virus (HPV), every five years. Some types of HPV increase your risk of cervical cancer. Testing for HPV may also be done on women of any age who have unclear Pap test results.  Other health care providers may not recommend any screening for nonpregnant women who are considered low risk for pelvic cancer and have no symptoms. Ask your health care provider if a screening pelvic exam is right for you.  If you have had past treatment for cervical cancer or a condition that could lead to cancer, you need Pap tests and screening for cancer for at least 20 years after your treatment. If Pap tests have been discontinued for you, your risk factors (such as having a new sexual partner) need to be  reassessed to determine if you should start having screenings again. Some women have medical problems that increase the chance of getting cervical cancer. In these cases, your health care provider may recommend that you have screening and Pap tests more often.  If you have a family history of uterine cancer or ovarian cancer, talk with your health care provider about genetic screening.  If you have vaginal bleeding after reaching menopause, tell your health care provider.  There are currently no reliable tests available to screen for ovarian cancer.  Lung Cancer Lung cancer screening is recommended for adults 69-62 years old who are at high risk for lung cancer because of a history of smoking. A yearly low-dose CT scan of the lungs is recommended if you:  Currently smoke.  Have a history of at least 30 pack-years of smoking and you currently smoke or have quit within the past 15 years. A pack-year is smoking an average of one pack of cigarettes per day for one year.  Yearly screening should:  Continue until it has been 15 years since you quit.  Stop if you develop a health problem that would prevent you from having lung cancer treatment.  Colorectal Cancer  This type of cancer can be detected and can often be prevented.  Routine colorectal cancer screening usually begins at  age 42 and continues through age 45.  If you have risk factors for colon cancer, your health care provider may recommend that you be screened at an earlier age.  If you have a family history of colorectal cancer, talk with your health care provider about genetic screening.  Your health care provider may also recommend using home test kits to check for hidden blood in your stool.  A small camera at the end of a tube can be used to examine your colon directly (sigmoidoscopy or colonoscopy). This is done to check for the earliest forms of colorectal cancer.  Direct examination of the colon should be repeated every  5-10 years until age 71. However, if early forms of precancerous polyps or small growths are found or if you have a family history or genetic risk for colorectal cancer, you may need to be screened more often.  Skin Cancer  Check your skin from head to toe regularly.  Monitor any moles. Be sure to tell your health care provider: ? About any new moles or changes in moles, especially if there is a change in a mole's shape or color. ? If you have a mole that is larger than the size of a pencil eraser.  If any of your family members has a history of skin cancer, especially at a Stalin Gruenberg age, talk with your health care provider about genetic screening.  Always use sunscreen. Apply sunscreen liberally and repeatedly throughout the day.  Whenever you are outside, protect yourself by wearing long sleeves, pants, a wide-brimmed hat, and sunglasses.  What should I know about osteoporosis? Osteoporosis is a condition in which bone destruction happens more quickly than new bone creation. After menopause, you may be at an increased risk for osteoporosis. To help prevent osteoporosis or the bone fractures that can happen because of osteoporosis, the following is recommended:  If you are 46-71 years old, get at least 1,000 mg of calcium and at least 600 mg of vitamin D per day.  If you are older than age 55 but younger than age 65, get at least 1,200 mg of calcium and at least 600 mg of vitamin D per day.  If you are older than age 54, get at least 1,200 mg of calcium and at least 800 mg of vitamin D per day.  Smoking and excessive alcohol intake increase the risk of osteoporosis. Eat foods that are rich in calcium and vitamin D, and do weight-bearing exercises several times each week as directed by your health care provider. What should I know about how menopause affects my mental health? Depression may occur at any age, but it is more common as you become older. Common symptoms of depression  include:  Low or sad mood.  Changes in sleep patterns.  Changes in appetite or eating patterns.  Feeling an overall lack of motivation or enjoyment of activities that you previously enjoyed.  Frequent crying spells.  Talk with your health care provider if you think that you are experiencing depression. What should I know about immunizations? It is important that you get and maintain your immunizations. These include:  Tetanus, diphtheria, and pertussis (Tdap) booster vaccine.  Influenza every year before the flu season begins.  Pneumonia vaccine.  Shingles vaccine.  Your health care provider may also recommend other immunizations. This information is not intended to replace advice given to you by your health care provider. Make sure you discuss any questions you have with your health care provider. Document Released: 01/06/2006  Document Revised: 06/03/2016 Document Reviewed: 08/18/2015 Elsevier Interactive Patient Education  2018 [employer].  Carbohydrate Counting for Diabetes Mellitus, Adult Carbohydrate counting is a method for keeping track of how many carbohydrates you eat. Eating carbohydrates naturally increases the amount of sugar (glucose) in the blood. Counting how many carbohydrates you eat helps keep your blood glucose within normal limits, which helps you manage your diabetes (diabetes mellitus). It is important to know how many carbohydrates you can safely have in each meal. This is different for every person. A diet and nutrition specialist (registered dietitian) can help you make a meal plan and calculate how many carbohydrates you should have at each meal and snack. Carbohydrates are found in the following foods:  Grains, such as breads and cereals.  Dried beans and soy products.  Starchy vegetables, such as potatoes, peas, and corn.  Fruit and fruit juices.  Milk and yogurt.  Sweets and snack foods, such as cake, cookies, candy, chips, and soft  drinks.  How do I count carbohydrates? There are two ways to count carbohydrates in food. You can use either of the methods or a combination of both. Reading "Nutrition Facts" on packaged food The "Nutrition Facts" list is included on the labels of almost all packaged foods and beverages in the [place] It includes:  The serving size.  Information about nutrients in each serving, including the grams (g) of carbohydrate per serving.  To use the "Nutrition Facts":  Decide how many servings you will have.  Multiply the number of servings by the number of carbohydrates per serving.  The resulting number is the total amount of carbohydrates that you will be having.  Learning standard serving sizes of other foods When you eat foods containing carbohydrates that are not packaged or do not include "Nutrition Facts" on the label, you need to measure the servings in order to count the amount of carbohydrates:  Measure the foods that you will eat with a food scale or measuring cup, if needed.  Decide how many standard-size servings you will eat.  Multiply the number of servings by 15. Most carbohydrate-rich foods have about 15 g of carbohydrates per serving. ? For example, if you eat 8 oz (170 g) of strawberries, you will have eaten 2 servings and 30 g of carbohydrates (2 servings x 15 g = 30 g).  For foods that have more than one food mixed, such as soups and casseroles, you must count the carbohydrates in each food that is included.  The following list contains standard serving sizes of common carbohydrate-rich foods. Each of these servings has about 15 g of carbohydrates:   hamburger bun or  English muffin.   oz (15 mL) syrup.   oz (14 g) jelly.  1 slice of bread.  1 six-inch tortilla.  3 oz (85 g) cooked rice or pasta.  4 oz (113 g) cooked dried beans.  4 oz (113 g) starchy vegetable, such as peas, corn, or potatoes.  4 oz (113 g) hot cereal.  4 oz (113 g) mashed potatoes  or  of a large baked potato.  4 oz (113 g) canned or frozen fruit.  4 oz (120 mL) fruit juice.  4-6 crackers.  6 chicken nuggets.  6 oz (170 g) unsweetened dry cereal.  6 oz (170 g) plain fat-free yogurt or yogurt sweetened with artificial sweeteners.  8 oz (240 mL) milk.  8 oz (170 g) fresh fruit or one small piece of fruit.  24 oz (680 g)  popcorn.  Example of carbohydrate counting Sample meal  3 oz (85 g) chicken breast.  6 oz (170 g) brown rice.  4 oz (113 g) corn.  8 oz (240 mL) milk.  8 oz (170 g) strawberries with sugar-free whipped topping. Carbohydrate calculation 1. Identify the foods that contain carbohydrates: ? Rice. ? Corn. ? Milk. ? Strawberries. 2. Calculate how many servings you have of each food: ? 2 servings rice. ? 1 serving corn. ? 1 serving milk. ? 1 serving strawberries. 3. Multiply each number of servings by 15 g: ? 2 servings rice x 15 g = 30 g. ? 1 serving corn x 15 g = 15 g. ? 1 serving milk x 15 g = 15 g. ? 1 serving strawberries x 15 g = 15 g. 4. Add together all of the amounts to find the total grams of carbohydrates eaten: ? 30 g + 15 g + 15 g + 15 g = 75 g of carbohydrates total. This information is not intended to replace advice given to you by your health care provider. Make sure you discuss any questions you have with your health care provider. Document Released: 11/14/2005 Document Revised: 06/03/2016 Document Reviewed: 04/27/2016 Elsevier Interactive Patient Education  2018 [employer]  

## 2018-09-12 NOTE — Progress Notes (Signed)
Becky Cox 1951-10-05 [id number]    History:    Presents for breast and pelvic exam.  1996 TAH for fibroids on no HRT.  Normal Pap  history.  02/2018 benign breast biopsy.  Hypertension, hypothyroidism managed by primary care.  Vaccines current.  Not sexually active.  2018 benign colon polyp 5-year follow-up.  Normal DEXA has follow-up scheduled with primary care.  Past medical history, past surgical history, family history and social history were all reviewed and documented in the EPIC chart.  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  2 daughters both doing well.  ROS:  A ROS was performed and pertinent positives and negatives are included.  Exam:  Vitals:   09/12/18 1433  BP: 132/80  Weight: 246 lb (111.6 kg)  Height: 5\' 3"  (1.6 m)   Body mass index is 43.58 kg/m.   General appearance:  Normal Thyroid:  Symmetrical, normal in size, without palpable masses or nodularity. Respiratory  Auscultation:  Clear without wheezing or rhonchi Cardiovascular  Auscultation:  Regular rate, without rubs, murmurs or gallops  Edema/varicosities:  Not grossly evident Abdominal  Soft,nontender, without masses, guarding or rebound.  Liver/spleen:  No organomegaly noted  Hernia:  None appreciated  Skin  Inspection:  Grossly normal   Breasts: Examined lying and sitting.     Right: Without masses, retractions, discharge or axillary adenopathy.     Left: Without masses, retractions, discharge or axillary adenopathy. Gentitourinary   Inguinal/mons:  Normal without inguinal adenopathy  External genitalia:  Normal  BUS/Urethra/Skene's glands:  Normal  Vagina: +2 rectocele  cervix: And uterus absent  Adnexa/parametria:     Rt: Without masses or tenderness.   Lt: Without masses or tenderness.  Anus and perineum: Normal  Digital rectal exam: Normal sphincter tone without palpated masses or tenderness  Assessment/Plan:  67 y.o. DBF G2, P2 for breast and pelvic exam with no  complaints.   16 TAH for fibroids/ no HRT /asymptomatic +2 rectocele 02/2018 benign breast biopsy Hypertension, hypothyroidism-primary care manages labs and meds Obesity  Plan: SBE's, continue annual 3D screening mammogram, calcium rich foods, vitamin D 2000 daily encouraged.  Reviewed importance of regular and balance type exercise. Safety and fall prevention discussed.  Reviewed importance of increasing exercise and decreasing calorie/carbs.     Huel Cote [facility], 2:41 PM 09/12/2018

## 2018-11-09 DIAGNOSIS — M2550 Pain in unspecified joint: Secondary | ICD-10-CM | POA: Insufficient documentation

## 2018-11-11 DIAGNOSIS — M791 Myalgia, unspecified site: Secondary | ICD-10-CM | POA: Insufficient documentation

## 2019-02-28 ENCOUNTER — Other Ambulatory Visit: Payer: Self-pay | Admitting: Family Medicine

## 2019-02-28 DIAGNOSIS — Z1231 Encounter for screening mammogram for malignant neoplasm of breast: Secondary | ICD-10-CM

## 2019-04-24 ENCOUNTER — Other Ambulatory Visit: Payer: Self-pay

## 2019-04-24 ENCOUNTER — Ambulatory Visit
Admission: RE | Admit: 2019-04-24 | Discharge: 2019-04-24 | Disposition: A | Discharge status: Home / Self Care | Payer: Medicare Other | Source: Ambulatory Visit | Attending: Family Medicine | Admitting: Family Medicine

## 2019-04-24 DIAGNOSIS — Z1231 Encounter for screening mammogram for malignant neoplasm of breast: Secondary | ICD-10-CM

## 2019-04-26 ENCOUNTER — Other Ambulatory Visit: Payer: Self-pay | Admitting: Family Medicine

## 2019-04-26 DIAGNOSIS — N6320 Unspecified lump in the left breast, unspecified quadrant: Secondary | ICD-10-CM

## 2019-04-30 ENCOUNTER — Other Ambulatory Visit: Payer: Self-pay | Admitting: Family Medicine

## 2019-04-30 ENCOUNTER — Other Ambulatory Visit: Payer: Self-pay

## 2019-04-30 ENCOUNTER — Ambulatory Visit
Admission: RE | Admit: 2019-04-30 | Discharge: 2019-04-30 | Disposition: A | Discharge status: Home / Self Care | Payer: Medicare Other | Source: Ambulatory Visit | Attending: Family Medicine | Admitting: Family Medicine

## 2019-04-30 DIAGNOSIS — R921 Mammographic calcification found on diagnostic imaging of breast: Secondary | ICD-10-CM

## 2019-04-30 DIAGNOSIS — N6320 Unspecified lump in the left breast, unspecified quadrant: Secondary | ICD-10-CM

## 2019-10-18 ENCOUNTER — Other Ambulatory Visit: Payer: Self-pay

## 2019-10-18 DIAGNOSIS — Z20822 Contact with and (suspected) exposure to covid-19: Secondary | ICD-10-CM

## 2019-10-21 LAB — NOVEL CORONAVIRUS, NAA: SARS-CoV-2, NAA: NOT DETECTED

## 2019-10-29 ENCOUNTER — Other Ambulatory Visit: Payer: Self-pay

## 2019-10-29 ENCOUNTER — Ambulatory Visit
Admission: RE | Admit: 2019-10-29 | Discharge: 2019-10-29 | Disposition: A | Discharge status: Home / Self Care | Payer: Medicare Other | Source: Ambulatory Visit | Attending: Family Medicine | Admitting: Family Medicine

## 2019-10-29 DIAGNOSIS — R921 Mammographic calcification found on diagnostic imaging of breast: Secondary | ICD-10-CM

## 2019-11-01 ENCOUNTER — Other Ambulatory Visit: Payer: Self-pay

## 2019-11-04 ENCOUNTER — Ambulatory Visit (INDEPENDENT_AMBULATORY_CARE_PROVIDER_SITE_OTHER): Payer: Medicare Other | Admitting: Women's Health

## 2019-11-04 ENCOUNTER — Encounter: Payer: Self-pay | Admitting: Women's Health

## 2019-11-04 ENCOUNTER — Other Ambulatory Visit: Payer: Self-pay

## 2019-11-04 VITALS — BP 124/80 | Ht 63.0 in | Wt 257.0 lb

## 2019-11-04 DIAGNOSIS — Z13820 Encounter for screening for osteoporosis: Secondary | ICD-10-CM | POA: Diagnosis not present

## 2019-11-04 DIAGNOSIS — Z01419 Encounter for gynecological examination (general) (routine) without abnormal findings: Secondary | ICD-10-CM | POA: Diagnosis not present

## 2019-11-04 NOTE — Progress Notes (Signed)
Becky Cox 10/04/1951 [id number]    History:    Presents for breast and pelvic exam.  1996 TAH for fibroids.  Normal Pap and mammogram history.  02/2018 benign breast biopsy and negative ultrasound diagnostic mammogram follow-up.  2018 benign colon polyp 5-year follow-up.  Normal DEXA at primary care.  Current on vaccines.  Primary care manages hypertension, hypothyroid.  Not sexually active.  Past medical history, past surgical history, family history and social history were all reviewed and documented in the EPIC chart.  Retired from [employer].  2 daughters both live local and are doing well.  ROS:  A ROS was performed and pertinent positives and negatives are included.  Exam:  Vitals:   11/04/19 1409  BP: 124/80  Weight: 257 lb (116.6 kg)  Height: 5\' 3"  (1.6 m)   Body mass index is 45.53 kg/m.   General appearance:  Normal Thyroid:  Symmetrical, normal in size, without palpable masses or nodularity. Respiratory  Auscultation:  Clear without wheezing or rhonchi Cardiovascular  Auscultation:  Regular rate, without rubs, murmurs or gallops  Edema/varicosities:  Not grossly evident Abdominal  Soft,nontender, without masses, guarding or rebound.  Liver/spleen:  No organomegaly noted  Hernia:  None appreciated  Skin  Inspection:  Grossly normal   Breasts: Examined lying and sitting.     Right: Without masses, retractions, discharge or axillary adenopathy.     Left: Without masses, retractions, discharge or axillary adenopathy. Gentitourinary   Inguinal/mons:  Normal without inguinal adenopathy  External genitalia:  Normal  BUS/Urethra/Skene's glands:  Normal  Vagina:  Normal  Cervix: And uterus absent   Adnexa/parametria:     Rt: Without masses or tenderness.   Lt: Without masses or tenderness.  Anus and perineum: Normal  Digital rectal exam: Normal sphincter tone without palpated masses or tenderness  Assessment/Plan:  68 y.o. SBF G2 P2 for breast and pelvic exam  with no complaints.  1996 TAH for fibroids Hypertension, hypothyroidism-primary care manages labs and meds 2018 benign colon polyp 5-year follow-up Obesity  Plan: SBEs, continue annual 3D screening mammogram, calcium rich foods, vitamin D 2000 daily encouraged.  Reviewed importance of increasing weightbearing and balance type exercise continue water aerobics.  Yoga encouraged.  Home safety, fall prevention discussed.  Aware of need to decrease calories/carbs.  DEXA, will schedule.  Pap screening guidelines reviewed.    Huel Cote [facility], 2:20 PM 11/04/2019

## 2019-11-04 NOTE — Patient Instructions (Addendum)
Health Maintenance After Age 67 After age 67, you are at a higher risk for certain long-term diseases and infections as well as injuries from falls. Falls are a major cause of broken bones and head injuries in people who are older than age 67. Getting regular preventive care can help to keep you healthy and well. Preventive care includes getting regular testing and making lifestyle changes as recommended by your health care provider. Talk with your health care provider about:  Which screenings and tests you should have. A screening is a test that checks for a disease when you have no symptoms.  A diet and exercise plan that is right for you. What should I know about screenings and tests to prevent falls? Screening and testing are the best ways to find a health problem early. Early diagnosis and treatment give you the best chance of managing medical conditions that are common after age 67. Certain conditions and lifestyle choices may make you more likely to have a fall. Your health care provider may recommend:  Regular vision checks. Poor vision and conditions such as cataracts can make you more likely to have a fall. If you wear glasses, make sure to get your prescription updated if your vision changes.  Medicine review. Work with your health care provider to regularly review all of the medicines you are taking, including over-the-counter medicines. Ask your health care provider about any side effects that may make you more likely to have a fall. Tell your health care provider if any medicines that you take make you feel dizzy or sleepy.  Osteoporosis screening. Osteoporosis is a condition that causes the bones to get weaker. This can make the bones weak and cause them to break more easily.  Blood pressure screening. Blood pressure changes and medicines to control blood pressure can make you feel dizzy.  Strength and balance checks. Your health care provider may recommend certain tests to check your  strength and balance while standing, walking, or changing positions.  Foot health exam. Foot pain and numbness, as well as not wearing proper footwear, can make you more likely to have a fall.  Depression screening. You may be more likely to have a fall if you have a fear of falling, feel emotionally low, or feel unable to do activities that you used to do.  Alcohol use screening. Using too much alcohol can affect your balance and may make you more likely to have a fall. What actions can I take to lower my risk of falls? General instructions  Talk with your health care provider about your risks for falling. Tell your health care provider if: ? You fall. Be sure to tell your health care provider about all falls, even ones that seem minor. ? You feel dizzy, sleepy, or off-balance.  Take over-the-counter and prescription medicines only as told by your health care provider. These include any supplements.  Eat a healthy diet and maintain a healthy weight. A healthy diet includes low-fat dairy products, low-fat (lean) meats, and fiber from whole grains, beans, and lots of fruits and vegetables. Home safety  Remove any tripping hazards, such as rugs, cords, and clutter.  Install safety equipment such as grab bars in bathrooms and safety rails on stairs.  Keep rooms and walkways well-lit. Activity   Follow a regular exercise program to stay fit. This will help you maintain your balance. Ask your health care provider what types of exercise are appropriate for you.  If you need a cane or   walker, use it as recommended by your health care provider.  Wear supportive shoes that have nonskid soles. Lifestyle  Do not drink alcohol if your health care provider tells you not to drink.  If you drink alcohol, limit how much you have: ? 0-1 drink a day for women. ? 0-2 drinks a day for men.  Be aware of how much alcohol is in your drink. In the [place] one drink equals one typical bottle of beer (12  oz), one-half glass of wine (5 oz), or one shot of hard liquor (1 oz).  Do not use any products that contain nicotine or tobacco, such as cigarettes and e-cigarettes. If you need help quitting, ask your health care provider. Summary  Having a healthy lifestyle and getting preventive care can help to protect your health and wellness after age 24.  Screening and testing are the best way to find a health problem early and help you avoid having a fall. Early diagnosis and treatment give you the best chance for managing medical conditions that are more common for people who are older than age 68.  Falls are a major cause of broken bones and head injuries in people who are older than age 62. Take precautions to prevent a fall at home.  Work with your health care provider to learn what changes you can make to improve your health and wellness and to prevent falls. This information is not intended to replace advice given to you by your health care provider. Make sure you discuss any questions you have with your health care provider. Document Released: 09/27/2017 Document Revised: 03/07/2019 Document Reviewed: 09/27/2017 Elsevier Patient Education  2020 [place] to see you today! Health Maintenance After Age 74 After age 82, you are at a higher risk for certain long-term diseases and infections as well as injuries from falls. Falls are a major cause of broken bones and head injuries in people who are older than age 35. Getting regular preventive care can help to keep you healthy and well. Preventive care includes getting regular testing and making lifestyle changes as recommended by your health care provider. Talk with your health care provider about:  Which screenings and tests you should have. A screening is a test that checks for a disease when you have no symptoms.  A diet and exercise plan that is right for you. What should I know about screenings and tests to prevent falls? Screening  and testing are the best ways to find a health problem early. Early diagnosis and treatment give you the best chance of managing medical conditions that are common after age 78. Certain conditions and lifestyle choices may make you more likely to have a fall. Your health care provider may recommend:  Regular vision checks. Poor vision and conditions such as cataracts can make you more likely to have a fall. If you wear glasses, make sure to get your prescription updated if your vision changes.  Medicine review. Work with your health care provider to regularly review all of the medicines you are taking, including over-the-counter medicines. Ask your health care provider about any side effects that may make you more likely to have a fall. Tell your health care provider if any medicines that you take make you feel dizzy or sleepy.  Osteoporosis screening. Osteoporosis is a condition that causes the bones to get weaker. This can make the bones weak and cause them to break more easily.  Blood pressure screening. Blood pressure changes and medicines  to control blood pressure can make you feel dizzy.  Strength and balance checks. Your health care provider may recommend certain tests to check your strength and balance while standing, walking, or changing positions.  Foot health exam. Foot pain and numbness, as well as not wearing proper footwear, can make you more likely to have a fall.  Depression screening. You may be more likely to have a fall if you have a fear of falling, feel emotionally low, or feel unable to do activities that you used to do.  Alcohol use screening. Using too much alcohol can affect your balance and may make you more likely to have a fall. What actions can I take to lower my risk of falls? General instructions  Talk with your health care provider about your risks for falling. Tell your health care provider if: ? You fall. Be sure to tell your health care provider about all  falls, even ones that seem minor. ? You feel dizzy, sleepy, or off-balance.  Take over-the-counter and prescription medicines only as told by your health care provider. These include any supplements.  Eat a healthy diet and maintain a healthy weight. A healthy diet includes low-fat dairy products, low-fat (lean) meats, and fiber from whole grains, beans, and lots of fruits and vegetables. Home safety  Remove any tripping hazards, such as rugs, cords, and clutter.  Install safety equipment such as grab bars in bathrooms and safety rails on stairs.  Keep rooms and walkways well-lit. Activity   Follow a regular exercise program to stay fit. This will help you maintain your balance. Ask your health care provider what types of exercise are appropriate for you.  If you need a cane or walker, use it as recommended by your health care provider.  Wear supportive shoes that have nonskid soles. Lifestyle  Do not drink alcohol if your health care provider tells you not to drink.  If you drink alcohol, limit how much you have: ? 0-1 drink a day for women. ? 0-2 drinks a day for men.  Be aware of how much alcohol is in your drink. In the [place] one drink equals one typical bottle of beer (12 oz), one-half glass of wine (5 oz), or one shot of hard liquor (1 oz).  Do not use any products that contain nicotine or tobacco, such as cigarettes and e-cigarettes. If you need help quitting, ask your health care provider. Summary  Having a healthy lifestyle and getting preventive care can help to protect your health and wellness after age 72.  Screening and testing are the best way to find a health problem early and help you avoid having a fall. Early diagnosis and treatment give you the best chance for managing medical conditions that are more common for people who are older than age 66.  Falls are a major cause of broken bones and head injuries in people who are older than age 76. Take precautions to  prevent a fall at home.  Work with your health care provider to learn what changes you can make to improve your health and wellness and to prevent falls. This information is not intended to replace advice given to you by your health care provider. Make sure you discuss any questions you have with your health care provider. Document Released: 09/27/2017 Document Revised: 03/07/2019 Document Reviewed: 09/27/2017 Elsevier Patient Education  2020 [employer].

## 2019-11-05 LAB — URINALYSIS, COMPLETE W/RFL CULTURE
Bacteria, UA: NONE SEEN /HPF
Bilirubin Urine: NEGATIVE
Glucose, UA: NEGATIVE
Hgb urine dipstick: NEGATIVE
Hyaline Cast: NONE SEEN /LPF
Ketones, ur: NEGATIVE
Leukocyte Esterase: NEGATIVE
Nitrites, Initial: NEGATIVE
Protein, ur: NEGATIVE
RBC / HPF: NONE SEEN /HPF (ref 0–2)
Specific Gravity, Urine: 1.015 (ref 1.001–1.03)
WBC, UA: NONE SEEN /HPF (ref 0–5)
pH: 7 (ref 5.0–8.0)

## 2019-11-05 LAB — NO CULTURE INDICATED

## 2019-11-29 HISTORY — PX: BREAST BIOPSY: SHX20

## 2020-04-16 ENCOUNTER — Other Ambulatory Visit: Payer: Self-pay | Admitting: Family Medicine

## 2020-04-16 DIAGNOSIS — Z1231 Encounter for screening mammogram for malignant neoplasm of breast: Secondary | ICD-10-CM

## 2020-04-24 DIAGNOSIS — M79643 Pain in unspecified hand: Secondary | ICD-10-CM | POA: Insufficient documentation

## 2020-04-24 DIAGNOSIS — Z8739 Personal history of other diseases of the musculoskeletal system and connective tissue: Secondary | ICD-10-CM | POA: Insufficient documentation

## 2020-04-29 ENCOUNTER — Other Ambulatory Visit: Payer: Self-pay

## 2020-04-29 ENCOUNTER — Ambulatory Visit
Admission: RE | Admit: 2020-04-29 | Discharge: 2020-04-29 | Disposition: A | Discharge status: Home / Self Care | Payer: Medicare Other | Source: Ambulatory Visit | Attending: Family Medicine | Admitting: Family Medicine

## 2020-04-29 DIAGNOSIS — Z1231 Encounter for screening mammogram for malignant neoplasm of breast: Secondary | ICD-10-CM

## 2020-04-30 ENCOUNTER — Other Ambulatory Visit: Payer: Self-pay | Admitting: Family Medicine

## 2020-04-30 DIAGNOSIS — N63 Unspecified lump in unspecified breast: Secondary | ICD-10-CM

## 2020-05-05 ENCOUNTER — Other Ambulatory Visit: Payer: Self-pay

## 2020-05-05 ENCOUNTER — Ambulatory Visit
Admission: RE | Admit: 2020-05-05 | Discharge: 2020-05-05 | Disposition: A | Discharge status: Home / Self Care | Payer: Medicare PPO | Source: Ambulatory Visit | Attending: Family Medicine | Admitting: Family Medicine

## 2020-05-05 ENCOUNTER — Other Ambulatory Visit: Payer: Self-pay | Admitting: Women's Health

## 2020-05-05 DIAGNOSIS — N63 Unspecified lump in unspecified breast: Secondary | ICD-10-CM

## 2020-05-05 DIAGNOSIS — R921 Mammographic calcification found on diagnostic imaging of breast: Secondary | ICD-10-CM

## 2020-05-13 ENCOUNTER — Ambulatory Visit
Admission: RE | Admit: 2020-05-13 | Discharge: 2020-05-13 | Disposition: A | Discharge status: Home / Self Care | Payer: Medicare PPO | Source: Ambulatory Visit | Attending: Obstetrics & Gynecology | Admitting: Obstetrics & Gynecology

## 2020-05-13 ENCOUNTER — Other Ambulatory Visit: Payer: Self-pay

## 2020-05-13 ENCOUNTER — Ambulatory Visit
Admission: RE | Admit: 2020-05-13 | Discharge: 2020-05-13 | Disposition: A | Discharge status: Home / Self Care | Payer: Medicare PPO | Source: Ambulatory Visit | Attending: Family Medicine | Admitting: Family Medicine

## 2020-05-13 ENCOUNTER — Other Ambulatory Visit: Payer: Self-pay | Admitting: Family Medicine

## 2020-05-13 DIAGNOSIS — R921 Mammographic calcification found on diagnostic imaging of breast: Secondary | ICD-10-CM

## 2020-06-25 ENCOUNTER — Encounter: Payer: Self-pay | Admitting: Podiatry

## 2020-06-25 ENCOUNTER — Other Ambulatory Visit: Payer: Self-pay

## 2020-06-25 ENCOUNTER — Ambulatory Visit (INDEPENDENT_AMBULATORY_CARE_PROVIDER_SITE_OTHER): Payer: Medicare PPO

## 2020-06-25 ENCOUNTER — Ambulatory Visit: Payer: Medicare PPO | Admitting: Podiatry

## 2020-06-25 ENCOUNTER — Other Ambulatory Visit: Payer: Self-pay | Admitting: Podiatry

## 2020-06-25 DIAGNOSIS — B351 Tinea unguium: Secondary | ICD-10-CM | POA: Diagnosis not present

## 2020-06-25 DIAGNOSIS — M778 Other enthesopathies, not elsewhere classified: Secondary | ICD-10-CM

## 2020-06-25 DIAGNOSIS — M79672 Pain in left foot: Secondary | ICD-10-CM | POA: Diagnosis not present

## 2020-06-25 DIAGNOSIS — R600 Localized edema: Secondary | ICD-10-CM

## 2020-06-25 DIAGNOSIS — M779 Enthesopathy, unspecified: Secondary | ICD-10-CM

## 2020-06-25 NOTE — Progress Notes (Signed)
Subjective:   Patient ID: Becky Cox, female   DOB: 69 y.o.   MRN: [id number]   HPI Patient presents stating that she has had some swelling that is advanced in her left ankle over the last year and has had it checked and is changing blood pressure medicine but is developing pain in her ankle.  Also is complaining about nail disease with thickness of 3 nails on each foot and is concerned about this and wants to know about treatment   ROS      Objective:  Physical Exam  Neurovascular status unchanged with patient noted to have thick yellow brittle nailbeds bilateral of the hallux third and fourth nails.  Has edema around the midfoot ankle +1 pitting and has exquisite discomfort into the sinus tarsi left with negative Bevelyn Buckles' sign noted.  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  We discussed topicals for the nail along with trimming techniques but do not recommend nail removal or oral medication.  As far as the inflammation I went ahead did a sterile prep injected the sinus tarsi 3 mg Kenalog 5 mg Xylocaine applied ankle compression stocking discussed localized edema recommended continued compression and utilization of elevation technique.  Reappoint to recheck  X-rays indicate that there is no signs of arthritis of the subtalar midtarsal joint with moderate depression of the arch noted bilateral

## 2020-08-07 DIAGNOSIS — Z860101 Personal history of adenomatous and serrated colon polyps: Secondary | ICD-10-CM | POA: Insufficient documentation

## 2020-09-15 ENCOUNTER — Ambulatory Visit: Payer: Medicare PPO | Attending: Internal Medicine

## 2020-09-15 DIAGNOSIS — Z23 Encounter for immunization: Secondary | ICD-10-CM

## 2020-09-15 NOTE — Progress Notes (Signed)
   Covid-19 Vaccination Clinic  Name:  Montserrat Shek    MRN: [id number] DOB: 1951-01-21  09/15/2020  Ms. Willette was observed post Covid-19 immunization for 15 minutes without incident. She was provided with Vaccine Information Sheet and instruction to access the V-Safe system.   Ms. Mccalip was instructed to call 911 with any severe reactions post vaccine: Marland Kitchen Difficulty breathing  . Swelling of face and throat  . A fast heartbeat  . A bad rash all over body  . Dizziness and weakness

## 2020-12-24 DIAGNOSIS — R0982 Postnasal drip: Secondary | ICD-10-CM | POA: Insufficient documentation

## 2020-12-24 DIAGNOSIS — R42 Dizziness and giddiness: Secondary | ICD-10-CM | POA: Insufficient documentation

## 2020-12-24 DIAGNOSIS — H9193 Unspecified hearing loss, bilateral: Secondary | ICD-10-CM | POA: Insufficient documentation

## 2021-01-26 ENCOUNTER — Other Ambulatory Visit (HOSPITAL_BASED_OUTPATIENT_CLINIC_OR_DEPARTMENT_OTHER): Payer: Self-pay

## 2021-01-26 DIAGNOSIS — G4733 Obstructive sleep apnea (adult) (pediatric): Secondary | ICD-10-CM

## 2021-02-23 ENCOUNTER — Ambulatory Visit (HOSPITAL_BASED_OUTPATIENT_CLINIC_OR_DEPARTMENT_OTHER): Payer: Medicare PPO

## 2021-02-23 DIAGNOSIS — G4733 Obstructive sleep apnea (adult) (pediatric): Secondary | ICD-10-CM

## 2021-02-24 ENCOUNTER — Other Ambulatory Visit: Payer: Self-pay

## 2021-03-09 ENCOUNTER — Ambulatory Visit (HOSPITAL_BASED_OUTPATIENT_CLINIC_OR_DEPARTMENT_OTHER): Payer: Medicare PPO | Attending: Internal Medicine | Admitting: Internal Medicine

## 2021-03-09 ENCOUNTER — Other Ambulatory Visit: Payer: Self-pay

## 2021-03-09 VITALS — Ht 63.0 in | Wt 256.0 lb

## 2021-03-09 DIAGNOSIS — G4733 Obstructive sleep apnea (adult) (pediatric): Secondary | ICD-10-CM | POA: Diagnosis not present

## 2021-03-09 DIAGNOSIS — R0902 Hypoxemia: Secondary | ICD-10-CM | POA: Diagnosis not present

## 2021-03-20 DIAGNOSIS — G4733 Obstructive sleep apnea (adult) (pediatric): Secondary | ICD-10-CM

## 2021-03-20 NOTE — Procedures (Signed)
   Patient Name: Becky Cox, Becky Cox Date: 03/09/2021 Gender: Female D.O.B: 1951-05-27 Age (years): 57 Referring Provider: Latanya Presser MD Height (inches): 87 Interpreting Physician: Baird Lyons MD, ABSM Weight (lbs): 256 RPSGT: Jacolyn Reedy BMI: 45 MRN: [id number] Neck Size: <br>  CLINICAL INFORMATION Sleep Study Type: HST Indication for sleep study: OSA  Epworth Sleepiness Score: 3  SLEEP STUDY TECHNIQUE A multi-channel overnight portable sleep study was performed. The channels recorded were: nasal airflow, thoracic respiratory movement, and oxygen saturation with a pulse oximetry. Snoring was also monitored.  MEDICATIONS Patient self administered medications include: none reported.  SLEEP ARCHITECTURE Patient was studied for 386.2 minutes. The sleep efficiency was 100.0 % and the patient was supine for 59.3%. The arousal index was 0.0 per hour.  RESPIRATORY PARAMETERS The overall AHI was 31.8 per hour, with a central apnea index of 0 per hour. The oxygen nadir was 75% during sleep.  CARDIAC DATA Mean heart rate during sleep was 75.6 bpm.  IMPRESSIONS - Severe obstructive sleep apnea occurred during this study (AHI = 31.8/h). - Oxygen desaturation was noted during this study (Min O2 = 75%).Mean O2 sat 91%. - Time with O2 saturation 89% or less was 83.4 minutes. - Patient snored 3.4% during the sleep.  DIAGNOSIS - Obstructive Sleep Apnea (G47.33) - Nocturnal Hypoxemia (G47.36)  RECOMMENDATIONS - Suggest CPAP titration sleep study or autopap. Other options would be based on clinical judgment. - Be careful with alcohol, sedatives and other CNS depressants that may worsen sleep apnea and disrupt normal sleep architecture. - Sleep hygiene should be reviewed to assess factors that may improve sleep quality. - Weight management and regular exercise should be initiated or continued.  [Electronically signed] 03/20/2021 11:07 AM  Baird Lyons MD,  ABSM Diplomate, American Board of Sleep Medicine   NPI: [id number]                         [place], [facility]  ELECTRONICALLY SIGNED ON:  03/20/2021, 11:03 AM [place] PH: [phone]   FX: [phone] [place]

## 2021-03-24 DIAGNOSIS — G4733 Obstructive sleep apnea (adult) (pediatric): Secondary | ICD-10-CM | POA: Insufficient documentation

## 2021-04-22 ENCOUNTER — Other Ambulatory Visit: Payer: Self-pay | Admitting: Orthopedic Surgery

## 2021-04-22 ENCOUNTER — Other Ambulatory Visit: Payer: Self-pay | Admitting: Ophthalmology

## 2021-04-22 ENCOUNTER — Other Ambulatory Visit: Payer: Self-pay | Admitting: Internal Medicine

## 2021-04-22 DIAGNOSIS — Z1231 Encounter for screening mammogram for malignant neoplasm of breast: Secondary | ICD-10-CM

## 2021-05-28 ENCOUNTER — Ambulatory Visit: Payer: Medicare PPO | Attending: Family

## 2021-05-28 DIAGNOSIS — Z23 Encounter for immunization: Secondary | ICD-10-CM

## 2021-05-28 NOTE — Progress Notes (Signed)
   Covid-19 Vaccination Clinic  Name:  Becky Cox    MRN: [id number] DOB: [birth year]  05/28/2021  Becky Cox was observed post Covid-19 immunization for 15 minutes without incident. She was provided with Vaccine Information Sheet and instruction to access the V-Safe system.   Becky Cox was instructed to call 911 with any severe reactions post vaccine: Difficulty breathing  Swelling of face and throat  A fast heartbeat  A bad rash all over body  Dizziness and weakness   Immunizations Administered     Name Date Dose VIS Date Route   Moderna Covid-19 Booster Vaccine 05/28/2021  1:33 PM 0.25 mL 09/16/2020 Intramuscular   Manufacturer: Moderna   Lot: [id number]   [place]: [id number]

## 2021-06-18 ENCOUNTER — Ambulatory Visit: Payer: Medicare PPO

## 2021-06-23 ENCOUNTER — Ambulatory Visit
Admission: RE | Admit: 2021-06-23 | Discharge: 2021-06-23 | Disposition: A | Discharge status: Home / Self Care | Payer: Medicare PPO | Source: Ambulatory Visit | Attending: Internal Medicine | Admitting: Internal Medicine

## 2021-06-23 ENCOUNTER — Other Ambulatory Visit: Payer: Self-pay

## 2021-06-23 DIAGNOSIS — Z1231 Encounter for screening mammogram for malignant neoplasm of breast: Secondary | ICD-10-CM

## 2021-07-07 ENCOUNTER — Ambulatory Visit: Payer: Medicare PPO | Admitting: Nurse Practitioner

## 2021-08-19 ENCOUNTER — Other Ambulatory Visit: Payer: Self-pay | Admitting: Internal Medicine

## 2021-08-19 ENCOUNTER — Encounter: Payer: Self-pay | Admitting: Nurse Practitioner

## 2021-08-19 ENCOUNTER — Other Ambulatory Visit: Payer: Self-pay

## 2021-08-19 ENCOUNTER — Ambulatory Visit (INDEPENDENT_AMBULATORY_CARE_PROVIDER_SITE_OTHER): Payer: Medicare PPO | Admitting: Nurse Practitioner

## 2021-08-19 VITALS — BP 124/74 | Ht 62.0 in | Wt 253.0 lb

## 2021-08-19 DIAGNOSIS — Z780 Asymptomatic menopausal state: Secondary | ICD-10-CM | POA: Diagnosis not present

## 2021-08-19 DIAGNOSIS — Z13820 Encounter for screening for osteoporosis: Secondary | ICD-10-CM

## 2021-08-19 DIAGNOSIS — Z01419 Encounter for gynecological examination (general) (routine) without abnormal findings: Secondary | ICD-10-CM

## 2021-08-19 NOTE — Progress Notes (Signed)
   Becky Cox [birth year] [id number]   History:  70 y.o. G2P2 presents for breast and pelvic exam. Postmenopausal - no HRT. S/P 1996 TAH for fibroids. Normal pap and mammogram history. HTN, hypothyroidism, bone density managed by PCP.  Gynecologic History No LMP recorded. Patient has had a hysterectomy.   Contraception: status post hysterectomy Sexually active: No  Health Maintenance Last Pap: 2012. Results were: Normal Last mammogram: 06/23/2021. Results were: Normal Last colonoscopy: 2020. Results were: benign polyp, 3-year recall Last Dexa: 2020. Results were: Normal per patient  Past medical history, past surgical history, family history and social history were all reviewed and documented in the EPIC chart. Divorced. 2 daughters, 3 grandchildren. Works PT at [employer], retired in 2014. Sister diagnosed with breast cancer at age 40, also with history of colon cancer.   ROS:  A ROS was performed and pertinent positives and negatives are included.  Exam:  Vitals:   08/19/21 1332  BP: 124/74  Weight: 253 lb (114.8 kg)  Height: 5\' 2"  (1.575 m)   Body mass index is 46.27 kg/m.  General appearance:  Normal Thyroid:  Symmetrical, normal in size, without palpable masses or nodularity. Respiratory  Auscultation:  Clear without wheezing or rhonchi Cardiovascular  Auscultation:  Regular rate, without rubs, murmurs or gallops  Edema/varicosities:  Not grossly evident Abdominal  Soft,nontender, without masses, guarding or rebound.  Liver/spleen:  No organomegaly noted  Hernia:  None appreciated  Skin  Inspection:  Grossly normal Breasts: Examined lying and sitting.   Right: Without masses, retractions, nipple discharge or axillary adenopathy.   Left: Without masses, retractions, nipple discharge or axillary adenopathy. Genitourinary   Inguinal/mons:  Normal without inguinal adenopathy  External genitalia:  Normal appearing vulva with no masses, tenderness, or  lesions  BUS/Urethra/Skene's glands:  Normal  Vagina:  Normal appearing with normal color and discharge, no lesions  Cervix:  Absent  Uterus:  Absent  Adnexa/parametria:     Rt: Normal in size, without masses or tenderness.   Lt: Normal in size, without masses or tenderness.  Anus and perineum: Normal  Digital rectal exam: Normal sphincter tone without palpated masses or tenderness  Patient informed chaperone available to be present for breast and pelvic exam. Patient has requested no chaperone to be present. Patient has been advised what will be completed during breast and pelvic exam.   Assessment/Plan:  70 y.o. G2P2 for breast and pelvic exam.   Well female exam with routine gynecological exam - Education provided on SBEs, importance of preventative screenings, current guidelines, high calcium diet, regular exercise, and multivitamin daily.  Labs with PCP.   Postmenopausal - no HRT.   Screening for cervical cancer - Normal Pap history. No longer screening per guidelines.   Screening for breast cancer - Normal mammogram history.  Continue annual screenings.  Normal breast exam today.  Screening for colon cancer - 2020 colonoscopy. Will repeat at GI's recommended interval.   Screening for osteoporosis - Normal Dexa 2020, managed by PCP. Plans to start water back with water aerobics.   Return in 2 years for breast and pelvic exam.   Tamela Gammon DNP, 1:54 PM 08/19/2021

## 2021-09-12 DIAGNOSIS — J302 Other seasonal allergic rhinitis: Secondary | ICD-10-CM | POA: Insufficient documentation

## 2021-09-12 DIAGNOSIS — K529 Noninfective gastroenteritis and colitis, unspecified: Secondary | ICD-10-CM | POA: Insufficient documentation

## 2021-10-14 ENCOUNTER — Ambulatory Visit: Admit: 2021-10-14 | Discharge: 2021-10-14 | Disposition: A | Discharge status: Home / Self Care | Payer: Medicare PPO

## 2021-10-14 ENCOUNTER — Ambulatory Visit
Admission: EM | Admit: 2021-10-14 | Discharge: 2021-10-14 | Disposition: A | Discharge status: Home / Self Care | Payer: Medicare PPO | Attending: Emergency Medicine | Admitting: Emergency Medicine

## 2021-10-14 ENCOUNTER — Other Ambulatory Visit: Payer: Self-pay

## 2021-10-14 DIAGNOSIS — H6981 Other specified disorders of Eustachian tube, right ear: Secondary | ICD-10-CM

## 2021-10-14 DIAGNOSIS — H903 Sensorineural hearing loss, bilateral: Secondary | ICD-10-CM | POA: Diagnosis not present

## 2021-10-14 DIAGNOSIS — H6991 Unspecified Eustachian tube disorder, right ear: Secondary | ICD-10-CM

## 2021-10-14 DIAGNOSIS — J310 Chronic rhinitis: Secondary | ICD-10-CM

## 2021-10-14 MED ORDER — IPRATROPIUM BROMIDE 0.06 % NA SOLN: 2.0000 | Freq: Four times a day (QID) | NASAL | 0 refills | Status: DC

## 2021-10-14 NOTE — ED Triage Notes (Signed)
Pt reports having an ear ache to the right ear.  Started: 6 days ago

## 2021-10-14 NOTE — ED Provider Notes (Signed)
[facility]    CSN: [id number] Arrival date & time: 10/14/21  1408   History   Chief Complaint No chief complaint on file.  HPI Becky Cox is a 70 y.o. female. Patient complains of having an earache in her right ear for the past 6 days.  Patient states she is attempted to get an appointment with her ear nose and throat provider however they were unable to see her today.   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  Patient states she sometimes hears a noise in her right ear, states that cold air bothers her ear such as talking on the phone, patient states she keeps trying to get it to pop but she is not able to do so.  Patient states she is aware she needs hearing aids but right now is having trouble coming up with the money that she will have to pay because her insurance has very poor coverage.  The history is provided by the patient.   Past Medical History:  Diagnosis Date   Fibroid    Hypertension    Thyroid disease    Nodules   Patient Active Problem List   Diagnosis Date Noted   Essential hypertension, benign 01/16/2013   Unspecified hypothyroidism 01/16/2013   Past Surgical History:  Procedure Laterality Date   ABDOMINAL HYSTERECTOMY  1996   ANKLE SURGERY  6-12   BONE SPURS IN RIGHT ANKLE   BREAST BIOPSY Left 2019   benign   FOOT SURGERY  2007 2008   KNEE SURGERY     Arthroscopic   MOLE REMOVAL  2009   BENIGN   THYROID CYST EXCISION     2001   OB History     Gravida  2   Para  2   Term      Preterm      AB      Living  2      SAB      IAB      Ectopic      Multiple      Live Births             Home Medications    Prior to Admission medications   Medication Sig Start Date End Date Taking? Authorizing Provider  ipratropium (ATROVENT) 0.06 % nasal spray Place 2 sprays into both nostrils 4 (four) times daily. 10/14/21  Yes Lynden Oxford Scales, PA-C  allopurinol  (ZYLOPRIM) 300 MG tablet Take 1 tablet by mouth daily. 03/04/15   [provider]  amLODipine (NORVASC) 5 MG tablet Take 10 tablets by mouth daily.  01/31/15   [provider]  Ascorbic Acid (VITAMIN C PO) Take by mouth.    [provider]  atorvastatin (LIPITOR) 10 MG tablet Take 1 tablet by mouth daily. 03/24/20   [provider]  fluticasone (FLONASE) 50 MCG/ACT nasal spray Place 2 sprays into the nose daily.    [provider]  folic acid (FOLVITE) 1 MG tablet Take 1 mg by mouth daily.    [provider]  levothyroxine (SYNTHROID) 100 MCG tablet Take 100 mcg by mouth daily.    [provider]  methotrexate (RHEUMATREX) 2.5 MG tablet take 6 tablets 07/06/19   [provider]  montelukast (SINGULAIR) 10 MG tablet Take 1 tablet by mouth at bedtime. 02/06/20   [provider]  Multiple Vitamin (MULTIVITAMIN PO) Take 1 tablet by mouth daily.    [provider]  triamterene-hydrochlorothiazide (MAXZIDE-25) 37.5-25 MG tablet Take 1 tablet by mouth daily.    [provider]   Family History Family History  Problem Relation Age of Onset   Breast cancer Sister 42   Hypertension Sister    Cancer Sister        COLON   Stroke Brother    Hypertension Brother    Breast cancer Maternal Grandmother    Social History Social History   Tobacco Use   Smoking status: Never   Smokeless tobacco: Never  Vaping Use   Vaping Use: Never used  Substance Use Topics   Alcohol use: No    Alcohol/week: 0.0 standard drinks   Drug use: No   Allergies   Patient has no known allergies.  Review of Systems Review of Systems Pertinent findings noted in history of present illness.   Physical Exam Triage Vital Signs ED Triage Vitals  Enc Vitals Group     BP 09/24/21 0827 (!) 147/82     Pulse Rate 09/24/21 0827 72     Resp 09/24/21 0827 18     Temp 09/24/21 0827 98.3 F (36.8 C)     Temp Source 09/24/21 0827 Oral      SpO2 09/24/21 0827 98 %     Weight --      Height --      Head Circumference --      Peak Flow --      Pain Score 09/24/21 0826 5     Pain Loc --      Pain Edu? --      Excl. in [place]? --    No data found.  Updated Vital Signs BP 119/68 (BP Location: Left Arm)   Pulse 68   Temp 98.8 F (37.1 C) (Oral)   Resp 20   SpO2 98%   Visual Acuity Right Eye Distance:   Left Eye Distance:   Bilateral Distance:    Right Eye Near:   Left Eye Near:    Bilateral Near:     Physical Exam Vitals and nursing note reviewed.  Constitutional:      General: She is not in acute distress.    Appearance: Normal appearance. She is not ill-appearing.  HENT:     Head: Normocephalic and atraumatic.     Salivary Glands: Right salivary gland is not diffusely enlarged or tender. Left salivary gland is not diffusely enlarged or tender.     Right Ear: Tympanic membrane, ear canal and external ear normal. No drainage. No middle ear effusion. There is no impacted cerumen. Tympanic membrane is not erythematous or bulging.     Left Ear: Tympanic membrane, ear canal and external ear normal. No drainage.  No middle ear effusion. There is no impacted cerumen. Tympanic membrane is not erythematous or bulging.     Nose: Rhinorrhea present. No nasal deformity, septal deviation, mucosal edema or congestion. Rhinorrhea is clear.     Right Turbinates: Enlarged, swollen and pale.     Left Turbinates: Enlarged, swollen and pale.     Right Sinus: No maxillary sinus tenderness or frontal sinus tenderness.     Left Sinus: No maxillary sinus tenderness or frontal sinus tenderness.     Mouth/Throat:     Lips: Pink. No lesions.     Mouth: Mucous membranes are moist. No oral lesions.     Pharynx: Oropharynx is clear. Uvula midline. No posterior oropharyngeal erythema or uvula swelling.     Tonsils: No tonsillar exudate. 0 on the right.  0 on the left.  Eyes:     General: Lids are normal.        Right eye: No discharge.         Left eye: No discharge.     Extraocular Movements: Extraocular movements intact.     Conjunctiva/sclera: Conjunctivae normal.     Right eye: Right conjunctiva is not injected.     Left eye: Left conjunctiva is not injected.  Neck:     Trachea: Trachea and phonation normal.  Cardiovascular:     Rate and Rhythm: Normal rate and regular rhythm.     Pulses: Normal pulses.     Heart sounds: Normal heart sounds. No murmur heard.   No friction rub. No gallop.  Pulmonary:     Effort: Pulmonary effort is normal. No accessory muscle usage, prolonged expiration or respiratory distress.     Breath sounds: Normal breath sounds. No stridor, decreased air movement or transmitted upper airway sounds. No decreased breath sounds, wheezing, rhonchi or rales.  Chest:     Chest wall: No tenderness.  Musculoskeletal:        General: Normal range of motion.     Cervical back: Normal range of motion and neck supple. Normal range of motion.  Lymphadenopathy:     Cervical: No cervical adenopathy.  Skin:    General: Skin is warm and dry.     Findings: No erythema or rash.  Neurological:     General: No focal deficit present.     Mental Status: She is alert and oriented to person, place, and time.  Psychiatric:        Mood and Affect: Mood normal.        Behavior: Behavior normal.   UC Treatments / Results  Labs (all labs ordered are listed, but only abnormal results are displayed)  Labs Reviewed - No data to display  EKG  Radiology No results found.  Procedures Procedures (including critical care time)  Medications Ordered in UC Medications - No data to display  Initial Impression / Assessment and Plan / UC Course  I have reviewed the triage vital signs and the nursing notes.  Pertinent labs & imaging results that were available during my care of the patient were reviewed by me and considered in my medical decision making (see chart for details).      Eustachian tube dysfunction in  right ear.  Recommend Atrovent nasal spray to dry up mucous membranes allow pressure to equalize.  Patient advised to follow-up with her ENT soon as possible.  Final Clinical Impressions(s) / UC Diagnoses   Final diagnoses:  Bilateral sensorineural hearing loss  Dysfunction of right eustachian tube  Rhinitis, unspecified type     Discharge Instructions      Based on the history that you provided me today along with my physical exam findings, I believe that you are suffering from eustachian tube dysfunction in your right ear.  I recommend that he begin Atrovent nasal spray to dry up mucous membranes, decrease the amount of drainage from your sinuses out to your ears in an effort to equalize the pressure between your nasal passages and your ears, eliminating that sensation of needing to pop your eardrum.  I strongly recommend that you follow-up with your ear nose and throat provider soon as possible to discuss this current episode and see if they have any further recommendations, particularly if Atrovent has not provided you any benefit over the weekend.     ED Prescriptions  Medication Sig Dispense Auth. Provider   ipratropium (ATROVENT) 0.06 % nasal spray Place 2 sprays into both nostrils 4 (four) times daily. 15 mL Lynden Oxford Scales, PA-C      PDMP not reviewed this encounter.  Disposition Upon Discharge:  Patient presented with an acute illness with associated systemic symptoms and significant discomfort requiring urgent management. In my opinion, this is a condition that a prudent lay person (someone who possesses an average knowledge of health and medicine) may potentially expect to result in complications if not addressed urgently such as respiratory distress, impairment of bodily function or dysfunction of bodily organs.   Routine symptom specific, illness specific and/or disease specific instructions were discussed with the patient and/or caregiver at length.   As  such, the patient has been evaluated and assessed, work-up was performed and treatment was provided in alignment with urgent care protocols and evidence based medicine.  Patient/parent/caregiver has been advised that the patient may require follow up for further testing and treatment if the symptoms continue in spite of treatment, as clinically indicated and appropriate.  Patient/parent/caregiver has been advised to return to the [facility] or PCP in 3-5 days if no better; to PCP or the Emergency Department if new signs and symptoms develop, or if the current signs or symptoms continue to change or worsen for further workup, evaluation and treatment as clinically indicated and appropriate  The patient will follow up with their current PCP if and as advised. If the patient does not currently have a PCP we will assist them in obtaining one.   Patient/parent/caregiver verbalized understanding and agreement of plan as discussed.  All questions were addressed during visit.  Please see discharge instructions below for further details of plan.  Condition: stable for discharge home Home: take medications as prescribed; routine discharge instructions as discussed; follow up as advised.    Lynden Oxford Scales, PA-C 10/14/21 [phone]

## 2021-10-14 NOTE — Discharge Instructions (Addendum)
Based on the history that you provided me today along with my physical exam findings, I believe that you are suffering from eustachian tube dysfunction in your right ear.  I recommend that he begin Atrovent nasal spray to dry up mucous membranes, decrease the amount of drainage from your sinuses out to your ears in an effort to equalize the pressure between your nasal passages and your ears, eliminating that sensation of needing to pop your eardrum.  I strongly recommend that you follow-up with your ear nose and throat provider soon as possible to discuss this current episode and see if they have any further recommendations, particularly if Atrovent has not provided you any benefit over the weekend.

## 2021-10-31 DIAGNOSIS — R0609 Other forms of dyspnea: Secondary | ICD-10-CM | POA: Insufficient documentation

## 2021-11-23 ENCOUNTER — Ambulatory Visit
Admission: RE | Admit: 2021-11-23 | Discharge: 2021-11-23 | Disposition: A | Discharge status: Home / Self Care | Payer: Medicare PPO | Source: Ambulatory Visit | Attending: Internal Medicine | Admitting: Internal Medicine

## 2021-11-23 ENCOUNTER — Other Ambulatory Visit: Payer: Self-pay

## 2021-11-23 DIAGNOSIS — Z13820 Encounter for screening for osteoporosis: Secondary | ICD-10-CM

## 2021-12-03 ENCOUNTER — Ambulatory Visit (HOSPITAL_COMMUNITY)
Admission: RE | Admit: 2021-12-03 | Discharge: 2021-12-03 | Disposition: A | Discharge status: Home / Self Care | Payer: Medicare PPO | Source: Ambulatory Visit | Attending: Cardiology | Admitting: Cardiology

## 2021-12-03 ENCOUNTER — Other Ambulatory Visit: Payer: Self-pay

## 2021-12-03 ENCOUNTER — Other Ambulatory Visit (HOSPITAL_COMMUNITY): Payer: Self-pay | Admitting: Internal Medicine

## 2021-12-03 DIAGNOSIS — R600 Localized edema: Secondary | ICD-10-CM

## 2022-01-31 DIAGNOSIS — M1712 Unilateral primary osteoarthritis, left knee: Secondary | ICD-10-CM | POA: Insufficient documentation

## 2022-02-10 ENCOUNTER — Other Ambulatory Visit: Payer: Self-pay

## 2022-02-10 ENCOUNTER — Ambulatory Visit
Admission: EM | Admit: 2022-02-10 | Discharge: 2022-02-10 | Disposition: A | Discharge status: Home / Self Care | Payer: Medicare PPO | Attending: Emergency Medicine | Admitting: Emergency Medicine

## 2022-02-10 DIAGNOSIS — B9689 Other specified bacterial agents as the cause of diseases classified elsewhere: Secondary | ICD-10-CM | POA: Diagnosis not present

## 2022-02-10 DIAGNOSIS — J310 Chronic rhinitis: Secondary | ICD-10-CM | POA: Diagnosis not present

## 2022-02-10 DIAGNOSIS — H66003 Acute suppurative otitis media without spontaneous rupture of ear drum, bilateral: Secondary | ICD-10-CM | POA: Diagnosis not present

## 2022-02-10 DIAGNOSIS — J019 Acute sinusitis, unspecified: Secondary | ICD-10-CM | POA: Diagnosis not present

## 2022-02-10 MED ORDER — CEFDINIR 300 MG PO CAPS: 300.0000 mg | ORAL_CAPSULE | Freq: Two times a day (BID) | ORAL | 0 refills | Status: AC

## 2022-02-10 MED ORDER — IPRATROPIUM BROMIDE 0.06 % NA SOLN: 2.0000 | Freq: Four times a day (QID) | NASAL | 2 refills | Status: AC

## 2022-02-10 NOTE — ED Triage Notes (Signed)
Pt c/o congestion and cough for 2 days.  ?

## 2022-02-10 NOTE — Discharge Instructions (Addendum)
Your symptoms and my physical exam findings are concerning for acute bacterial ear infection in both of your ears and probable bacterial sinusitis.  Your symptoms and my physical exam findings are also concerning for exacerbation of your underlying allergies which is probably caused you to develop these infections.  It is important that you are consistent with taking allergy medications exactly as prescribed.   ?  ?Please see the list below for recommended medications, dosages and frequencies to provide relief of your current symptoms:   ? ?Cefdinir (Omnicef):  1 capsule twice daily for 10 days, you can take it with or without food.  This antibiotic can cause upset stomach, this will resolve once antibiotics are complete.  You are welcome to use a probiotic, eat yogurt, take Imodium while taking this medication.  Please avoid other systemic medications such as Maalox, Pepto-Bismol or milk of magnesia as they can interfere with your body's ability to absorb the antibiotics. ?  ?Flonase (fluticasone): This is a steroid nasal spray that you use once daily, 1 spray in each nare.  This medication does not work well if you decide to use it only used as you feel you need to, it works best used on a daily basis.  After 3 to 5 days of use, you will notice significant reduction of the inflammation and mucus production that is currently being caused by exposure to allergens, whether seasonal or environmental.  The most common side effect of this medication is nosebleeds.  If you experience a nosebleed, please discontinue use for 1 week, then feel free to resume.  I have provided you with a prescription but you can also purchase this medication over-the-counter if your insurance will not cover it. ?  ?Ipratropium (Atrovent): This is an excellent nasal decongestant spray that does not cause rebound congestion, please instill 2 sprays into each nare with each use.  Because nasal steroids can take several days before they begin to  provide full benefit, I recommend that you use this spray in addition to the nasal steroid prescribed for you.  Please use it after you have used your nasal steroid and repeat up to 4 times daily as needed.  I have provided you with a prescription for this medication.    ? ?Thank you for visiting urgent care today.  We appreciate the opportunity to participate in your care. ?  ?  ?  ? ?

## 2022-02-10 NOTE — ED Provider Notes (Signed)
?[facility] ? ? ? ?CSN: [id number] ?Arrival date & time: 02/10/22  1026 ?  ? ?HISTORY  ? ?Chief Complaint  ?Patient presents with  ? Nasal Congestion  ? Cough  ? ?HPI ?Becky Cox is a 71 y.o. female. Patient complains of nasal congestion and cough for the past two days.  Patient was seen in November 2022 for similar symptoms.  At that time, she was prescribed ipratropium nasal spray to use in addition to her previously prescribed Flonase steroid nasal spray.  Patient states she is only using Flonase at this time.  Patient reports a history of frequent sinus infections, states she is here today to see if she needs an antibiotic. ? ?The history is provided by the patient.  ?Past Medical History:  ?Diagnosis Date  ? Fibroid   ? Hypertension   ? Thyroid disease   ? Nodules  ? ?Patient Active Problem List  ? Diagnosis Date Noted  ? Essential hypertension, benign 01/16/2013  ? Unspecified hypothyroidism 01/16/2013  ? ?Past Surgical History:  ?Procedure Laterality Date  ? ABDOMINAL HYSTERECTOMY  1996  ? ANKLE SURGERY  6-12  ? BONE SPURS IN RIGHT ANKLE  ? BREAST BIOPSY Left 2019  ? benign  ? FOOT SURGERY  2007 2008  ? KNEE SURGERY    ? Arthroscopic  ? MOLE REMOVAL  2009  ? BENIGN  ? THYROID CYST EXCISION    ? 2001  ? ?OB History   ? ? Gravida  ?2  ? Para  ?2  ? Term  ?   ? Preterm  ?   ? AB  ?   ? Living  ?2  ?  ? ? SAB  ?   ? IAB  ?   ? Ectopic  ?   ? Multiple  ?   ? Live Births  ?   ?   ?  ?  ? ?Home Medications   ? ?Prior to Admission medications   ?Medication Sig Start Date End Date Taking? Authorizing Provider  ?allopurinol (ZYLOPRIM) 300 MG tablet Take 1 tablet by mouth daily. 03/04/15   [provider]  ?amLODipine (NORVASC) 5 MG tablet Take 10 tablets by mouth daily.  01/31/15   [provider]  ?Ascorbic Acid (VITAMIN C PO) Take by mouth.    [provider]  ?atorvastatin (LIPITOR) 10 MG tablet Take 1 tablet by mouth daily. 03/24/20   [provider]   ?fluticasone (FLONASE) 50 MCG/ACT nasal spray Place 2 sprays into the nose daily.    [provider]  ?folic acid (FOLVITE) 1 MG tablet Take 1 mg by mouth daily.    [provider]  ?ipratropium (ATROVENT) 0.06 % nasal spray Place 2 sprays into both nostrils 4 (four) times daily. 10/14/21   Lynden Oxford Scales, PA-C  ?levothyroxine (SYNTHROID) 100 MCG tablet Take 100 mcg by mouth daily.    [provider]  ?methotrexate (RHEUMATREX) 2.5 MG tablet take 6 tablets 07/06/19   [provider]  ?montelukast (SINGULAIR) 10 MG tablet Take 1 tablet by mouth at bedtime. 02/06/20   [provider]  ?Multiple Vitamin (MULTIVITAMIN PO) Take 1 tablet by mouth daily.    [provider]  ?triamterene-hydrochlorothiazide (MAXZIDE-25) 37.5-25 MG tablet Take 1 tablet by mouth daily.    [provider]  ? ?Family History ?Family History  ?Problem Relation Age of Onset  ? Breast cancer Sister 59  ? Hypertension Sister   ? Cancer Sister   ?  COLON  ? Stroke Brother   ? Hypertension Brother   ? Breast cancer Maternal Grandmother   ? ?Social History ?Social History  ? ?Tobacco Use  ? Smoking status: Never  ? Smokeless tobacco: Never  ?Vaping Use  ? Vaping Use: Never used  ?Substance Use Topics  ? Alcohol use: No  ?  Alcohol/week: 0.0 standard drinks  ? Drug use: No  ? ?Allergies   ?Patient has no known allergies. ? ?Review of Systems ?Review of Systems ?Pertinent findings noted in history of present illness.  ? ?Physical Exam ?Triage Vital Signs ?ED Triage Vitals  ?Enc Vitals Group  ?   BP 09/24/21 0827 (!) 147/82  ?   Pulse Rate 09/24/21 0827 72  ?   Resp 09/24/21 0827 18  ?   Temp 09/24/21 0827 98.3 ?F (36.8 ?C)  ?   Temp Source 09/24/21 0827 Oral  ?   SpO2 09/24/21 0827 98 %  ?   Weight --   ?   Height --   ?   Head Circumference --   ?   Peak Flow --   ?   Pain Score 09/24/21 0826 5  ?   Pain Loc --   ?   Pain Edu? --   ?   Excl. in [place]? --   ?No data found. ? ?Updated  Vital Signs ?BP 126/68 (BP Location: Right Arm)   Pulse 72   Temp 99 ?F (37.2 ?C) (Oral)   Resp 20   SpO2 95%  ? ?Physical Exam ?Vitals and nursing note reviewed.  ?Constitutional:   ?   General: She is not in acute distress. ?   Appearance: Normal appearance. She is not ill-appearing.  ?HENT:  ?   Head: Normocephalic and atraumatic.  ?   Salivary Glands: Right salivary gland is not diffusely enlarged or tender. Left salivary gland is not diffusely enlarged or tender.  ?   Right Ear: Hearing and external ear normal. No drainage. A middle ear effusion is present. There is no impacted cerumen. Tympanic membrane is injected and erythematous. Tympanic membrane is not bulging.  ?   Left Ear: Hearing and external ear normal. No drainage. A middle ear effusion is present. There is no impacted cerumen. Tympanic membrane is injected and erythematous. Tympanic membrane is not bulging.  ?   Ears:  ?   Comments: Bilateral TMs with suppurative fluid, injection and erythema, bilateral EACs with mild erythema ?   Nose: Mucosal edema, congestion and rhinorrhea present. No nasal deformity or septal deviation. Rhinorrhea is clear.  ?   Right Turbinates: Enlarged, swollen and pale.  ?   Left Turbinates: Enlarged, swollen and pale.  ?   Right Sinus: Maxillary sinus tenderness and frontal sinus tenderness present.  ?   Left Sinus: Maxillary sinus tenderness and frontal sinus tenderness present.  ?   Mouth/Throat:  ?   Lips: Pink. No lesions.  ?   Mouth: Mucous membranes are moist. No oral lesions.  ?   Pharynx: Oropharynx is clear. Uvula midline. No posterior oropharyngeal erythema or uvula swelling.  ?   Tonsils: No tonsillar exudate. 0 on the right. 0 on the left.  ?Eyes:  ?   General: Lids are normal.     ?   Right eye: No discharge.     ?   Left eye: No discharge.  ?   Extraocular Movements: Extraocular movements intact.  ?   Conjunctiva/sclera: Conjunctivae normal.  ?   Right eye:  Right conjunctiva is not injected.  ?   Left  eye: Left conjunctiva is not injected.  ?Neck:  ?   Trachea: Trachea and phonation normal.  ?Cardiovascular:  ?   Rate and Rhythm: Normal rate and regular rhythm.  ?   Pulses: Normal pulses.  ?   Heart sounds: Normal heart sounds. No murmur heard. ?  No friction rub. No gallop.  ?Pulmonary:  ?   Effort: Pulmonary effort is normal. No accessory muscle usage, prolonged expiration or respiratory distress.  ?   Breath sounds: Normal breath sounds. No stridor, decreased air movement or transmitted upper airway sounds. No decreased breath sounds, wheezing, rhonchi or rales.  ?Chest:  ?   Chest wall: No tenderness.  ?Musculoskeletal:     ?   General: Normal range of motion.  ?   Cervical back: Normal range of motion and neck supple. Normal range of motion.  ?Lymphadenopathy:  ?   Cervical: No cervical adenopathy.  ?Skin: ?   General: Skin is warm and dry.  ?   Findings: No erythema or rash.  ?Neurological:  ?   General: No focal deficit present.  ?   Mental Status: She is alert and oriented to person, place, and time.  ?Psychiatric:     ?   Mood and Affect: Mood normal.     ?   Behavior: Behavior normal.  ? ? ?Visual Acuity ?Right Eye Distance:   ?Left Eye Distance:   ?Bilateral Distance:   ? ?Right Eye Near:   ?Left Eye Near:    ?Bilateral Near:    ? ?UC Couse / Diagnostics / Procedures:  ?  ?EKG ? ?Radiology ?No results found. ? ?Procedures ?Procedures (including critical care time) ? ?UC Diagnoses / Final Clinical Impressions(s)   ?I have reviewed the triage vital signs and the nursing notes. ? ?Pertinent labs & imaging results that were available during my care of the patient were reviewed by me and considered in my medical decision making (see chart for details).   ?Final diagnoses:  ?Acute suppurative otitis media of both ears without spontaneous rupture of tympanic membranes, recurrence not specified  ?Acute bacterial sinusitis  ?Rhinosinusitis  ? ?Patient advised to be consistent with medications and I also renewed  prescription for Atrovent which I think will help her Flonase work better.  Patient provided with a prescription for cefdinir for bilateral ear infections and probable bacterial sinus infection.  Return precautions a

## 2022-05-24 ENCOUNTER — Other Ambulatory Visit: Payer: Self-pay | Admitting: Internal Medicine

## 2022-05-24 DIAGNOSIS — Z1231 Encounter for screening mammogram for malignant neoplasm of breast: Secondary | ICD-10-CM

## 2022-06-18 LAB — POCT GLYCOSYLATED HEMOGLOBIN (HGB A1C): Hemoglobin-A1c: 5.3

## 2022-06-18 LAB — GLUCOSE, POCT (MANUAL RESULT ENTRY): POC Glucose: 107 mg/dl — AB (ref 70–99)

## 2022-06-20 ENCOUNTER — Ambulatory Visit: Payer: Medicare PPO | Admitting: Podiatry

## 2022-06-20 ENCOUNTER — Ambulatory Visit (INDEPENDENT_AMBULATORY_CARE_PROVIDER_SITE_OTHER): Payer: Medicare PPO

## 2022-06-20 DIAGNOSIS — M79672 Pain in left foot: Secondary | ICD-10-CM

## 2022-06-20 DIAGNOSIS — M7752 Other enthesopathy of left foot: Secondary | ICD-10-CM | POA: Diagnosis not present

## 2022-06-20 DIAGNOSIS — M779 Enthesopathy, unspecified: Secondary | ICD-10-CM

## 2022-06-20 DIAGNOSIS — M76822 Posterior tibial tendinitis, left leg: Secondary | ICD-10-CM

## 2022-06-20 MED ORDER — TRIAMCINOLONE ACETONIDE 10 MG/ML IJ SUSP
20.0000 mg | Freq: Once | INTRAMUSCULAR | Status: AC
  Administered 2022-06-20: 20 mg

## 2022-06-20 NOTE — Progress Notes (Signed)
Subjective:   Patient ID: Becky Cox, female   DOB: 71 y.o.   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  Patient will be seen back to recheck  X-rays indicate that there is moderate depression of the arch no indications of advanced arthritis of the digit

## 2022-06-24 ENCOUNTER — Ambulatory Visit: Payer: Medicare PPO

## 2022-06-28 ENCOUNTER — Ambulatory Visit
Admission: RE | Admit: 2022-06-28 | Discharge: 2022-06-28 | Disposition: A | Discharge status: Home / Self Care | Payer: Medicare PPO | Source: Ambulatory Visit | Attending: Internal Medicine | Admitting: Internal Medicine

## 2022-06-28 DIAGNOSIS — Z1231 Encounter for screening mammogram for malignant neoplasm of breast: Secondary | ICD-10-CM

## 2022-07-04 ENCOUNTER — Encounter: Payer: Self-pay | Admitting: Podiatry

## 2022-07-04 ENCOUNTER — Ambulatory Visit: Payer: Medicare PPO | Admitting: Podiatry

## 2022-07-04 DIAGNOSIS — M76822 Posterior tibial tendinitis, left leg: Secondary | ICD-10-CM

## 2022-07-04 DIAGNOSIS — M7752 Other enthesopathy of left foot: Secondary | ICD-10-CM | POA: Diagnosis not present

## 2022-07-04 DIAGNOSIS — R600 Localized edema: Secondary | ICD-10-CM

## 2022-07-06 NOTE — Progress Notes (Signed)
Subjective:   Patient ID: Becky Cox, female   DOB: 71 y.o.   MRN: [id number]   HPI Patient presents stating she is improved but she still is getting some pain more now on the outside of the foot than the inside of the foot   ROS      Objective:  Physical Exam  No neurovascular status intact with the patient's medial ankle doing much better with diminished discomfort obesity is complicating factor with moderate edema and swelling discomfort in the lateral side of the foot local with negative Homans' sign     Assessment:  Appears to be inflammatory but I did not note any advanced pathology associated with this appears to be soft tissue and is better as far as the initial presenting pain     Plan:  Reviewed condition recommended elevation and compression and good supportive shoes and hopefully this will heal uneventfully given strict instructions that if pain should recur in the ankle or if she should develop any leg pain or other pathology to let us know immediately and if she develops any systemic signs to go straight to the emergency room

## 2022-08-18 ENCOUNTER — Telehealth: Payer: Self-pay | Admitting: Oncology

## 2022-08-18 NOTE — Telephone Encounter (Signed)
Scheduled appt per 9/20 referral. Pt is aware of appt date and time. Pt is aware to arrive 15 mins prior to appt time and to bring and updated insurance card. Pt is aware of appt location.   

## 2022-08-24 ENCOUNTER — Other Ambulatory Visit: Payer: Self-pay | Admitting: Podiatry

## 2022-08-24 DIAGNOSIS — M779 Enthesopathy, unspecified: Secondary | ICD-10-CM

## 2022-09-09 ENCOUNTER — Inpatient Hospital Stay: Payer: Medicare PPO | Attending: Oncology | Admitting: Oncology

## 2022-09-09 ENCOUNTER — Inpatient Hospital Stay: Payer: Medicare PPO

## 2022-09-09 DIAGNOSIS — G4733 Obstructive sleep apnea (adult) (pediatric): Secondary | ICD-10-CM | POA: Diagnosis not present

## 2022-09-09 DIAGNOSIS — D649 Anemia, unspecified: Secondary | ICD-10-CM | POA: Insufficient documentation

## 2022-09-09 DIAGNOSIS — M069 Rheumatoid arthritis, unspecified: Secondary | ICD-10-CM | POA: Insufficient documentation

## 2022-09-09 DIAGNOSIS — I10 Essential (primary) hypertension: Secondary | ICD-10-CM | POA: Insufficient documentation

## 2022-09-09 DIAGNOSIS — E039 Hypothyroidism, unspecified: Secondary | ICD-10-CM | POA: Insufficient documentation

## 2022-09-09 DIAGNOSIS — M109 Gout, unspecified: Secondary | ICD-10-CM | POA: Diagnosis not present

## 2022-09-09 DIAGNOSIS — D589 Hereditary hemolytic anemia, unspecified: Secondary | ICD-10-CM | POA: Insufficient documentation

## 2022-09-09 DIAGNOSIS — H9193 Unspecified hearing loss, bilateral: Secondary | ICD-10-CM | POA: Diagnosis not present

## 2022-09-09 DIAGNOSIS — D500 Iron deficiency anemia secondary to blood loss (chronic): Secondary | ICD-10-CM

## 2022-09-09 DIAGNOSIS — R768 Other specified abnormal immunological findings in serum: Secondary | ICD-10-CM

## 2022-09-09 LAB — CMP (CANCER CENTER ONLY)
ALT: 32 U/L (ref 0–44)
AST: 35 U/L (ref 15–41)
Albumin: 4.1 g/dL (ref 3.5–5.0)
Alkaline Phosphatase: 101 U/L (ref 38–126)
Anion gap: 4 — ABNORMAL LOW (ref 5–15)
BUN: 17 mg/dL (ref 8–23)
CO2: 30 mmol/L (ref 22–32)
Calcium: 9.2 mg/dL (ref 8.9–10.3)
Chloride: 108 mmol/L (ref 98–111)
Creatinine: 0.72 mg/dL (ref 0.44–1.00)
GFR, Estimated: >60 mL/min (ref >60)
Glucose, Bld: 102 mg/dL — ABNORMAL HIGH (ref 70–99)
Potassium: 3.7 mmol/L (ref 3.5–5.1)
Sodium: 142 mmol/L (ref 135–145)
Total Bilirubin: 3.3 mg/dL — ABNORMAL HIGH (ref 0.3–1.2)
Total Protein: 6.5 g/dL (ref 6.5–8.1)

## 2022-09-09 LAB — FOLATE: Folate: 27.3 ng/mL (ref >5.9)

## 2022-09-09 LAB — CBC WITH DIFFERENTIAL (CANCER CENTER ONLY)
Abs Immature Granulocytes: 0.03 10*3/uL (ref 0.00–0.07)
Basophils Absolute: 0.1 10*3/uL (ref 0.0–0.1)
Basophils Relative: 1 %
Eosinophils Absolute: 0.4 10*3/uL (ref 0.0–0.5)
Eosinophils Relative: 4 %
HCT: 28.4 % — ABNORMAL LOW (ref 36.0–46.0)
Hemoglobin: 10.2 g/dL — ABNORMAL LOW (ref 12.0–15.0)
Immature Granulocytes: 0 %
Lymphocytes Relative: 35 %
Lymphs Abs: 3.3 10*3/uL (ref 0.7–4.0)
MCH: 34.7 pg — ABNORMAL HIGH (ref 26.0–34.0)
MCHC: 35.9 g/dL (ref 30.0–36.0)
MCV: 96.6 fL (ref 80.0–100.0)
Monocytes Absolute: 0.5 10*3/uL (ref 0.1–1.0)
Monocytes Relative: 5 %
Neutro Abs: 5.2 10*3/uL (ref 1.7–7.7)
Neutrophils Relative %: 55 %
Platelet Count: 346 10*3/uL (ref 150–400)
RBC: 2.94 MIL/uL — ABNORMAL LOW (ref 3.87–5.11)
RDW: 19.8 % — ABNORMAL HIGH (ref 11.5–15.5)
WBC Count: 9.6 10*3/uL (ref 4.0–10.5)
nRBC: 0 % (ref 0.0–0.2)

## 2022-09-09 LAB — DIRECT ANTIGLOBULIN TEST (NOT AT ARMC)
DAT, IgG: NEGATIVE
DAT, complement: POSITIVE

## 2022-09-09 LAB — RETICULOCYTES
Immature Retic Fract: 11.6 % (ref 2.3–15.9)
RBC.: 3.34 MIL/uL — ABNORMAL LOW (ref 3.87–5.11)
Retic Count, Absolute: 109.9 10*3/uL (ref 19.0–186.0)
Retic Ct Pct: 3.3 % — ABNORMAL HIGH (ref 0.4–3.1)

## 2022-09-09 LAB — FERRITIN: Ferritin: 242 ng/mL (ref 11–307)

## 2022-09-09 LAB — VITAMIN B12: Vitamin B-12: 500 pg/mL (ref 180–914)

## 2022-09-09 NOTE — Progress Notes (Signed)
[facility] Initial Visit:  Patient Care Team: Audley Hose, MD as PCP - General (Internal Medicine)  CHIEF COMPLAINTS/PURPOSE OF CONSULTATION:  Oncology History   No history exists.    HISTORY OF PRESENTING ILLNESS: Becky Cox 71 y.o. female is here because of anemia Medical history notable for thyroid nodule, hypothyroidism, morbid obesity, obstructive sleep apnea, bilateral hearing loss, hypertension, rheumatoid arthritis, chronic diarrhea, hypertension, gout  March 22 2022:  Colonoscopy.  3 very small polyps  July 29, 2022: WBC 7.2 hemoglobin 10.3 MCV 93 platelet count 311; 45 segs 44 lymphs 5 monos 5 eos 1 basophil.  Specimen was prewarmed to 37 degrees to obtain results.  Technician suspected possibility of cold agglutinins/cryoglobulins present Chemistries notable for creatinine 0.79 albumin 3.9.  AST ALT and alk phos normal T. bili 2.3 direct bilirubin 0.6.  Reticulocyte count 2.8%  September 09 2022:  [facility] Hematology Consult Was told years ago that she was anemic.   Has never taken oral iron.  No history of IV iron or PRBC's.   Does not take ASA.  Mainly takes Tylenol for pain; rarely ibuprofen No pica to ice.  Uses CPAP every night  Social:  Divorced.  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  Tobacco none.  EtOH  [facility] Mother died 32 MI Father died 20 pneumonia Sister died 23 CVA, MS Sister alive 75 colon cancer Sister alive 59 Parkinson's disease Sister alive 64 HTN Sister alive 38 osteoarthritis Brother alive 28 TBI from motorcycle accident, HTN Brother died 85 CVA Brother died 67 colon cancer  Review of Systems  Constitutional:  Negative for appetite change, chills, fatigue, fever and unexpected weight change.  HENT:   Negative for mouth sores, nosebleeds, sore throat, trouble swallowing and voice change.   Eyes:  Negative for eye problems and icterus.       Vision changes:  None  Respiratory:  Negative  for chest tightness, cough, hemoptysis and wheezing.        Some DOE when does a lot of walking  Cardiovascular:  Positive for leg swelling. Negative for chest pain and palpitations.       PND:  none Orthopnea:  none  Gastrointestinal:  Negative for abdominal pain, blood in stool, constipation, diarrhea, nausea and vomiting.  Endocrine: Negative for hot flashes.       Cold intolerance:  none Heat intolerance:  none  Genitourinary:  Negative for bladder incontinence, difficulty urinating, dysuria, frequency, hematuria and nocturia.   Musculoskeletal:  Positive for arthralgias. Negative for back pain, gait problem, myalgias and neck pain.       Arthralgias of knees and ankles  Skin:  Negative for itching, rash and wound.  Neurological:  Negative for dizziness, extremity weakness, gait problem, headaches and numbness.  Hematological:  Negative for adenopathy. Does not bruise/bleed easily.  Psychiatric/Behavioral:  Negative for sleep disturbance and suicidal ideas. The patient is not nervous/anxious.     MEDICAL HISTORY: Past Medical History:  Diagnosis Date   Fibroid    Hypertension    Thyroid disease    Nodules    SURGICAL HISTORY: Past Surgical History:  Procedure Laterality Date   ABDOMINAL HYSTERECTOMY  1996   ANKLE SURGERY  04/2011   BONE SPURS IN RIGHT ANKLE   BREAST BIOPSY Left 2019   benign   BREAST BIOPSY Right 2021   FOOT SURGERY  2007 2008   KNEE SURGERY     Arthroscopic   MOLE REMOVAL  2009  BENIGN   THYROID CYST EXCISION     2001    SOCIAL HISTORY: Social History   Socioeconomic History   Marital status: Divorced    Spouse name: Not on file   Number of children: Not on file   Years of education: Not on file   Highest education level: Not on file  Occupational History   Not on file  Tobacco Use   Smoking status: Never   Smokeless tobacco: Never  Vaping Use   Vaping Use: Never used  Substance and Sexual Activity   Alcohol use: No     Alcohol/week: 0.0 standard drinks of alcohol   Drug use: No   Sexual activity: Not Currently    Comment: INTERCOURSE AGE UNKNOWN , SEXUAL PARTNERS MOE THAN 6  Other Topics Concern   Not on file  Social History Narrative   Not on file   Social Determinants of Health   Financial Resource Strain: Not on file  Food Insecurity: Not on file  Transportation Needs: Not on file  Physical Activity: Not on file  Stress: Not on file  Social Connections: Not on file  Intimate Partner Violence: Not on file    FAMILY HISTORY Family History  Problem Relation Age of Onset   Breast cancer Sister 15   Hypertension Sister    Cancer Sister        COLON   Stroke Brother    Hypertension Brother    Breast cancer Maternal Grandmother     ALLERGIES:  has No Known Allergies.  MEDICATIONS:  Current Outpatient Medications  Medication Sig Dispense Refill   allopurinol (ZYLOPRIM) 300 MG tablet Take 1 tablet by mouth daily.     amLODipine (NORVASC) 5 MG tablet Take 10 tablets by mouth daily.      Ascorbic Acid (VITAMIN C PO) Take by mouth.     atorvastatin (LIPITOR) 10 MG tablet Take 1 tablet by mouth daily.     diclofenac Sodium (VOLTAREN) 1 % GEL Apply topically.     fluticasone (FLONASE) 50 MCG/ACT nasal spray Place 2 sprays into the nose daily.     folic acid (FOLVITE) 1 MG tablet Take 1 mg by mouth daily.     ipratropium (ATROVENT) 0.06 % nasal spray Place 2 sprays into both nostrils 4 (four) times daily. 15 mL 2   levothyroxine (SYNTHROID) 100 MCG tablet Take 100 mcg by mouth daily.     methotrexate (RHEUMATREX) 2.5 MG tablet take 6 tablets     montelukast (SINGULAIR) 10 MG tablet Take 1 tablet by mouth at bedtime.     Multiple Vitamin (MULTIVITAMIN PO) Take 1 tablet by mouth daily.     triamterene-hydrochlorothiazide (MAXZIDE-25) 37.5-25 MG tablet Take 1 tablet by mouth daily.     No current facility-administered medications for this visit.    PHYSICAL EXAMINATION:  ECOG PERFORMANCE  STATUS: 1 - Symptomatic but completely ambulatory   Vitals:   09/09/22 1503  BP: (!) 137/53  Pulse: 85  Resp: 19  Temp: 97.8 F (36.6 C)  SpO2: 99%    Filed Weights   09/09/22 1503  Weight: 259 lb 1.6 oz (117.5 kg)     Physical Exam Vitals and nursing note reviewed.  Constitutional:      General: She is not in acute distress.    Appearance: Normal appearance. She is obese. She is not ill-appearing, toxic-appearing or diaphoretic.     Comments: Here alone  HENT:     Head: Normocephalic and atraumatic.  Right Ear: External ear normal.     Left Ear: External ear normal.     Nose: Nose normal. No congestion or rhinorrhea.  Eyes:     General: No scleral icterus.    Extraocular Movements: Extraocular movements intact.     Conjunctiva/sclera: Conjunctivae normal.     Pupils: Pupils are equal, round, and reactive to light.  Cardiovascular:     Rate and Rhythm: Normal rate and regular rhythm.     Heart sounds: Normal heart sounds. No murmur heard.    No friction rub. No gallop.  Pulmonary:     Effort: Pulmonary effort is normal. No respiratory distress.     Breath sounds: No stridor. Rhonchi present. No wheezing or rales.  Abdominal:     General: Bowel sounds are normal. There is no distension.     Palpations: Abdomen is soft. There is no mass.     Tenderness: There is no abdominal tenderness. There is no guarding or rebound.     Hernia: No hernia is present.  Musculoskeletal:        General: No swelling, tenderness, deformity or signs of injury.     Cervical back: Normal range of motion and neck supple. No rigidity or tenderness.     Right lower leg: Edema present.     Left lower leg: Edema present.  Lymphadenopathy:     Head:     Right side of head: No submental, submandibular, tonsillar, preauricular, posterior auricular or occipital adenopathy.     Left side of head: No submental, submandibular, tonsillar, preauricular, posterior auricular or occipital adenopathy.      Cervical: No cervical adenopathy.     Right cervical: No superficial, deep or posterior cervical adenopathy.    Left cervical: No superficial, deep or posterior cervical adenopathy.     Upper Body:     Right upper body: No supraclavicular, axillary, pectoral or epitrochlear adenopathy.     Left upper body: No supraclavicular, axillary, pectoral or epitrochlear adenopathy.  Skin:    General: Skin is warm.     Coloration: Skin is not jaundiced or pale.     Findings: No bruising or erythema.  Neurological:     General: No focal deficit present.     Mental Status: She is alert and oriented to person, place, and time. Mental status is at baseline.     Cranial Nerves: No cranial nerve deficit.     Motor: No weakness.     Gait: Gait normal.  Psychiatric:        Mood and Affect: Mood normal.        Behavior: Behavior normal.        Thought Content: Thought content normal.        Judgment: Judgment normal.     LABORATORY DATA: I have personally reviewed the data as listed:  Appointment on 09/09/2022  Component Date Value Ref Range Status   Haptoglobin 09/09/2022 <10 (L)  37 - 355 mg/dL Final   Comment: (NOTE) Performed At: [facility] [address] [place], [place] [id number] Rush Farmer MD [id number]    Zinc 09/09/2022 97  44 - 115 ug/dL Final   Comment: (NOTE) This test was developed and its performance characteristics determined by [facility]. It has not been cleared or approved by the Food and Drug Administration.                                Detection  Limit = 5 Performed At: [facility] [place], [place] [id number] Rush Farmer MD [id number]    Vitamin B-12 09/09/2022 500  180 - 914 pg/mL Final   Comment: (NOTE) This assay is not validated for testing neonatal or myeloproliferative syndrome specimens for Vitamin B12 levels. Performed at [facility], [place] [address] [place], [place] [address]    Retic  Ct Pct 09/09/2022 3.3 (H)  0.4 - 3.1 % Final   RBC. 09/09/2022 3.34 (L)  3.87 - 5.11 MIL/uL Final   Retic Count, Absolute 09/09/2022 109.9  19.0 - 186.0 K/uL Final   Immature Retic Fract 09/09/2022 11.6  2.3 - 15.9 % Final   Performed at [facility], [place] [address], [place], [place] [address]   Kappa free light chain 09/09/2022 25.1 (H)  3.3 - 19.4 mg/L Final   Lambda free light chains 09/09/2022 28.3 (H)  5.7 - 26.3 mg/L Final   Kappa, lambda light chain ratio 09/09/2022 0.89  0.26 - 1.65 Final   Comment: (NOTE) Performed At: [facility] [place], [place] [id number] Rush Farmer MD VW:[id number]    IgG (Immunoglobin G), Serum 09/09/2022 624  586 - 1,602 mg/dL Final   IgA 09/09/2022 121  87 - 352 mg/dL Final   IgM (Immunoglobulin M), Srm 09/09/2022 74  26 - 217 mg/dL Final   Total Protein ELP 09/09/2022 6.0  6.0 - 8.5 g/dL Corrected   Albumin SerPl Elph-Mcnc 09/09/2022 3.7  2.9 - 4.4 g/dL Corrected   Alpha 1 09/09/2022 0.3  0.0 - 0.4 g/dL Corrected   Alpha2 Glob SerPl Elph-Mcnc 09/09/2022 0.4  0.4 - 1.0 g/dL Corrected   B-Globulin SerPl Elph-Mcnc 09/09/2022 1.0  0.7 - 1.3 g/dL Corrected   Gamma Glob SerPl Elph-Mcnc 09/09/2022 0.5  0.4 - 1.8 g/dL Corrected   M Protein SerPl Elph-Mcnc 09/09/2022 Not Observed  Not Observed g/dL Corrected   Globulin, Total 09/09/2022 2.3  2.2 - 3.9 g/dL Corrected   Albumin/Glob SerPl 09/09/2022 1.7  0.7 - 1.7 Corrected   IFE 1 09/09/2022 Comment   Corrected   Comment: (NOTE) The immunofixation pattern appears unremarkable. Evidence of monoclonal protein is not apparent.    Please Note 09/09/2022 Comment   Corrected   Comment: (NOTE) Protein electrophoresis scan will follow via computer, mail, or courier delivery. Performed At: [facility] [place], [place] [id number] Rush Farmer MD [id number]    Hgb F 09/09/2022 0.4  0.0 - 2.0 % Final   Hgb A 09/09/2022 97.0  96.4 -  98.8 % Final   Hgb A2 09/09/2022 2.6  1.8 - 3.2 % Final   Hgb S 09/09/2022 0.0  0.0 % Final   Interpretation, Hgb Fract 09/09/2022 Comment   Final   Comment: (NOTE) Normal hemoglobin present; no hemoglobin variant or beta thalassemia identified. Note: Alpha thalassemia may not be detected by the Hgb Fractionation Cascade panel. If alpha thalassemia is suspected, [facility] offers Alpha-Thalassemia DNA Analysis [phone]). Performed At: [facility] [place], [place] [id number] Rush Farmer MD [id number]    Folate 09/09/2022 27.3  >5.9 ng/mL Final   Comment: RESULT CONFIRMED BY MANUAL DILUTION Performed at [facility] [address] [place], [place] [address]    Ferritin 09/09/2022 242  11 - 307 ng/mL Final   Performed at [facility], [address] [place],  [address]   DAT, complement 09/09/2022 POS   Final   DAT, IgG 09/09/2022  Final                   Value:NEG Performed at [facility] [address], [place], [place] [address]    Copper 09/09/2022 128  80 - 158 ug/dL Final   Comment: (NOTE) This test was developed and its performance characteristics determined by [facility]. It has not been cleared or approved by the Food and Drug Administration.                                Detection Limit = 5 Performed At: [facility] [place], [place] [id number] Rush Farmer MD RC:[id number]    Sodium 09/09/2022 142  135 - 145 mmol/L Final   Potassium 09/09/2022 3.7  3.5 - 5.1 mmol/L Final   Chloride 09/09/2022 108  98 - 111 mmol/L Final   CO2 09/09/2022 30  22 - 32 mmol/L Final   Glucose, Bld 09/09/2022 102 (H)  70 - 99 mg/dL Final   Glucose reference range applies only to samples taken after fasting for at least 8 hours.   BUN 09/09/2022 17  8 - 23 mg/dL Final   Creatinine 09/09/2022 0.72  0.44 - 1.00 mg/dL Final   Calcium 09/09/2022 9.2  8.9 - 10.3 mg/dL Final    Total Protein 09/09/2022 6.5  6.5 - 8.1 g/dL Final   Albumin 09/09/2022 4.1  3.5 - 5.0 g/dL Final   AST 09/09/2022 35  15 - 41 U/L Final   ALT 09/09/2022 32  0 - 44 U/L Final   Alkaline Phosphatase 09/09/2022 101  38 - 126 U/L Final   Total Bilirubin 09/09/2022 3.3 (H)  0.3 - 1.2 mg/dL Final   GFR, Estimated 09/09/2022 >60  >60 mL/min Final   Comment: (NOTE) Calculated using the CKD-EPI Creatinine Equation (2021)    Anion gap 09/09/2022 4 (L)  5 - 15 Final   Performed at [facility], [place] [address] [place], [place] [address]   WBC Count 09/09/2022 9.6  4.0 - 10.5 K/uL Final   RBC 09/09/2022 2.94 (L)  3.87 - 5.11 MIL/uL Final   Hemoglobin 09/09/2022 10.2 (L)  12.0 - 15.0 g/dL Final   HCT 09/09/2022 28.4 (L)  36.0 - 46.0 % Final   MCV 09/09/2022 96.6  80.0 - 100.0 fL Final   MCH 09/09/2022 34.7 (H)  26.0 - 34.0 pg Final   MCHC 09/09/2022 35.9  30.0 - 36.0 g/dL Final   RDW 09/09/2022 19.8 (H)  11.5 - 15.5 % Final   Platelet Count 09/09/2022 346  150 - 400 K/uL Final   nRBC 09/09/2022 0.0  0.0 - 0.2 % Final   Neutrophils Relative % 09/09/2022 55  % Final   Neutro Abs 09/09/2022 5.2  1.7 - 7.7 K/uL Final   Lymphocytes Relative 09/09/2022 35  % Final   Lymphs Abs 09/09/2022 3.3  0.7 - 4.0 K/uL Final   Monocytes Relative 09/09/2022 5  % Final   Monocytes Absolute 09/09/2022 0.5  0.1 - 1.0 K/uL Final   Eosinophils Relative 09/09/2022 4  % Final   Eosinophils Absolute 09/09/2022 0.4  0.0 - 0.5 K/uL Final   Basophils Relative 09/09/2022 1  % Final   Basophils Absolute 09/09/2022 0.1  0.0 - 0.1 K/uL Final   Immature Granulocytes 09/09/2022 0  % Final   Abs Immature Granulocytes 09/09/2022 0.03  0.00 - 0.07 K/uL Final   Performed  at [facility], [place] [address] [place], [place] [address]   Cold Agglutinin Titer 09/09/2022 >1:4096  Neg <1:32 Final   Comment: (NOTE) Performed At: [facility] [place], [place]  [id number] Rush Farmer MD WH:[id number]     RADIOGRAPHIC STUDIES: I have personally reviewed the radiological images as listed and agree with the findings in the report  No results found.  ASSESSMENT/PLAN  71 y.o. female with medical history notable for thyroid nodule, hypothyroidism, morbid obesity, obstructive sleep apnea, bilateral hearing loss, hypertension, rheumatoid arthritis, chronic diarrhea, hypertension, gout.  Patient is seen for evaluation and management of anemia  Anemia:  Labs most notable for the observation that there was agglutination noted on the peripheral blood smear.  Will obtain Obtain CBC with diff, CMP, retic count, Ferritin B12, folate, DAT, Haptoglobin, SPEP with IEP, free light chains and smear for morphology review.  In addition will obtain Cold agglutinin titer     Cancer Staging  No matching staging information was found for the patient.   No problem-specific Assessment & Plan notes found for this encounter.   Orders Placed This Encounter  Procedures   CBC with Differential (Cancer Center Only)    Standing Status:   Future    Number of Occurrences:   1    Standing Expiration Date:   09/10/2023   CMP ([facility] only)    Standing Status:   Future    Number of Occurrences:   1    Standing Expiration Date:   09/10/2023   Copper, serum    Standing Status:   Future    Number of Occurrences:   1    Standing Expiration Date:   09/10/2023   Ferritin    Standing Status:   Future    Number of Occurrences:   1    Standing Expiration Date:   09/10/2023   Folate    Standing Status:   Future    Number of Occurrences:   1    Standing Expiration Date:   09/10/2023   Hgb Fractionation Cascade    Standing Status:   Future    Number of Occurrences:   1    Standing Expiration Date:   09/10/2023   Multiple Myeloma Panel (SPEP&IFE w/QIG)    Standing Status:   Future    Number of Occurrences:   1    Standing Expiration Date:   09/10/2023    Kappa/lambda light chains    Standing Status:   Future    Number of Occurrences:   1    Standing Expiration Date:   09/10/2023   Reticulocytes    Standing Status:   Future    Number of Occurrences:   1    Standing Expiration Date:   09/10/2023   Vitamin B12    Standing Status:   Future    Number of Occurrences:   1    Standing Expiration Date:   09/10/2023   Zinc    Standing Status:   Future    Number of Occurrences:   1    Standing Expiration Date:   09/10/2023   Haptoglobin    Standing Status:   Future    Number of Occurrences:   1    Standing Expiration Date:   09/10/2023   Cold agglutinin titer    Standing Status:   Future    Number of Occurrences:   1    Standing Expiration Date:   09/10/2023   Direct  antiglobulin test (not at [facility])    Standing Status:   Future    Number of Occurrences:   1    Standing Expiration Date:   09/10/2023    All questions were answered. The patient knows to call the clinic with any problems, questions or concerns.  This note was electronically signed.    Barbee Cough, MD  09/20/2022 11:30 AM

## 2022-09-11 LAB — HAPTOGLOBIN: Haptoglobin: <10 mg/dL — ABNORMAL LOW (ref 37–355)

## 2022-09-12 ENCOUNTER — Telehealth: Payer: Self-pay | Admitting: Oncology

## 2022-09-12 LAB — KAPPA/LAMBDA LIGHT CHAINS
Kappa free light chain: 25.1 mg/L — ABNORMAL HIGH (ref 3.3–19.4)
Kappa, lambda light chain ratio: 0.89 (ref 0.26–1.65)
Lambda free light chains: 28.3 mg/L — ABNORMAL HIGH (ref 5.7–26.3)

## 2022-09-12 LAB — COLD AGGLUTININ TITER: Cold Agglutinin Titer: >1:4096 {titer}

## 2022-09-12 NOTE — Telephone Encounter (Signed)
Scheduled appt per 10/13 los. Pt is aware.

## 2022-09-13 LAB — HGB FRACTIONATION CASCADE
Hgb A2: 2.6 % (ref 1.8–3.2)
Hgb A: 97 % (ref 96.4–98.8)
Hgb F: 0.4 % (ref 0.0–2.0)
Hgb S: 0 %

## 2022-09-14 LAB — MULTIPLE MYELOMA PANEL, SERUM
Albumin SerPl Elph-Mcnc: 3.7 g/dL (ref 2.9–4.4)
Albumin/Glob SerPl: 1.7 (ref 0.7–1.7)
Alpha 1: 0.3 g/dL (ref 0.0–0.4)
Alpha2 Glob SerPl Elph-Mcnc: 0.4 g/dL (ref 0.4–1.0)
B-Globulin SerPl Elph-Mcnc: 1 g/dL (ref 0.7–1.3)
Gamma Glob SerPl Elph-Mcnc: 0.5 g/dL (ref 0.4–1.8)
Globulin, Total: 2.3 g/dL (ref 2.2–3.9)
IgA: 121 mg/dL (ref 87–352)
IgG (Immunoglobin G), Serum: 624 mg/dL (ref 586–1602)
IgM (Immunoglobulin M), Srm: 74 mg/dL (ref 26–217)
Total Protein ELP: 6 g/dL (ref 6.0–8.5)

## 2022-09-16 ENCOUNTER — Telehealth: Payer: Self-pay

## 2022-09-16 NOTE — Telephone Encounter (Signed)
This nurse reached out to patient and made her aware that provider has ordered a bone marrow biopsy.  Advised there is availability to do the procedure on Friday 10/27 at 730 am.  This nurse explained the procedure to the patient.  Patient acknowledged understanding and is in agreement with the appointment.  

## 2022-09-18 LAB — COPPER, SERUM: Copper: 128 ug/dL (ref 80–158)

## 2022-09-18 LAB — ZINC: Zinc: 97 ug/dL (ref 44–115)

## 2022-09-20 DIAGNOSIS — R768 Other specified abnormal immunological findings in serum: Secondary | ICD-10-CM | POA: Insufficient documentation

## 2022-09-22 ENCOUNTER — Other Ambulatory Visit: Payer: Self-pay

## 2022-09-22 DIAGNOSIS — D500 Iron deficiency anemia secondary to blood loss (chronic): Secondary | ICD-10-CM

## 2022-09-23 ENCOUNTER — Inpatient Hospital Stay (HOSPITAL_BASED_OUTPATIENT_CLINIC_OR_DEPARTMENT_OTHER): Payer: Medicare PPO | Admitting: Oncology

## 2022-09-23 ENCOUNTER — Encounter: Payer: Self-pay | Admitting: Pharmacist

## 2022-09-23 ENCOUNTER — Inpatient Hospital Stay: Payer: Medicare PPO

## 2022-09-23 VITALS — BP 138/70 | HR 77 | Temp 98.2°F | Resp 18

## 2022-09-23 DIAGNOSIS — D479 Neoplasm of uncertain behavior of lymphoid, hematopoietic and related tissue, unspecified: Secondary | ICD-10-CM | POA: Diagnosis not present

## 2022-09-23 DIAGNOSIS — R768 Other specified abnormal immunological findings in serum: Secondary | ICD-10-CM | POA: Diagnosis not present

## 2022-09-23 DIAGNOSIS — C8580 Other specified types of non-Hodgkin lymphoma, unspecified site: Secondary | ICD-10-CM | POA: Insufficient documentation

## 2022-09-23 DIAGNOSIS — D500 Iron deficiency anemia secondary to blood loss (chronic): Secondary | ICD-10-CM

## 2022-09-23 DIAGNOSIS — D649 Anemia, unspecified: Secondary | ICD-10-CM | POA: Diagnosis not present

## 2022-09-23 LAB — CBC WITH DIFFERENTIAL (CANCER CENTER ONLY)
Abs Immature Granulocytes: 0.03 10*3/uL (ref 0.00–0.07)
Basophils Absolute: 0.1 10*3/uL (ref 0.0–0.1)
Basophils Relative: 1 %
Eosinophils Absolute: 0.4 10*3/uL (ref 0.0–0.5)
Eosinophils Relative: 6 %
HCT: 25.7 % — ABNORMAL LOW (ref 36.0–46.0)
Hemoglobin: 8.8 g/dL — ABNORMAL LOW (ref 12.0–15.0)
Immature Granulocytes: 1 %
Lymphocytes Relative: 35 %
Lymphs Abs: 2.3 10*3/uL (ref 0.7–4.0)
MCH: 32.6 pg (ref 26.0–34.0)
MCHC: 34.2 g/dL (ref 30.0–36.0)
MCV: 95.2 fL (ref 80.0–100.0)
Monocytes Absolute: 0.5 10*3/uL (ref 0.1–1.0)
Monocytes Relative: 8 %
Neutro Abs: 3.3 10*3/uL (ref 1.7–7.7)
Neutrophils Relative %: 49 %
Platelet Count: 306 10*3/uL (ref 150–400)
RBC: 2.7 MIL/uL — ABNORMAL LOW (ref 3.87–5.11)
RDW: 17.6 % — ABNORMAL HIGH (ref 11.5–15.5)
WBC Count: 6.6 10*3/uL (ref 4.0–10.5)
nRBC: 0.3 % — ABNORMAL HIGH (ref 0.0–0.2)

## 2022-09-23 NOTE — Patient Instructions (Signed)
Bone Marrow Aspiration and Bone Marrow Biopsy, Adult, Care After The following information offers guidance on how to care for yourself after your procedure. Your health care provider may also give you more specific instructions. If you have problems or questions, contact your health care provider. What can I expect after the procedure? After the procedure, it is common to have: Mild pain and tenderness. Swelling. Bruising. Follow these instructions at home: Incision care  Follow instructions from your health care provider about how to take care of the incision site. Make sure you: Wash your hands with soap and water for at least 20 seconds before and after you change your bandage (dressing). If soap and water are not available, use hand sanitizer. Change your dressing as told by your health care provider. Leave stitches (sutures), skin glue, or adhesive strips in place. These skin closures may need to stay in place for 2 weeks or longer. If adhesive strip edges start to loosen and curl up, you may trim the loose edges. Do not remove adhesive strips completely unless your health care provider tells you to do that. Check your incision site every day for signs of infection. Check for: More redness, swelling, or pain. Fluid or blood. Warmth. Pus or a bad smell. Activity Return to your normal activities as told by your health care provider. Ask your health care provider what activities are safe for you. Do not lift anything that is heavier than 10 lb (4.5 kg), or the limit that you are told, until your health care provider says that it is safe. If you were given a sedative during the procedure, it can affect you for several hours. Do not drive or operate machinery until your health care provider says that it is safe. General instructions  Take over-the-counter and prescription medicines only as told by your health care provider. Do not take baths, swim, or use a hot tub until your health care  provider approves. Ask your health care provider if you may take showers. You may only be allowed to take sponge baths. If directed, put ice on the affected area. To do this: Put ice in a plastic bag. Place a towel between your skin and the bag. Leave the ice on for 20 minutes, 2-3 times a day. If your skin turns bright red, remove the ice right away to prevent skin damage. The risk of skin damage is higher if you cannot feel pain, heat, or cold. Contact a health care provider if: You have signs of infection. Your pain is not controlled with medicine. You have cancer, and a temperature of 100.30F (38C) or higher. Get help right away if: You have a temperature of 101F (38.3C) or higher, or as told by your health care provider. You have bleeding from the incision site that cannot be controlled. This information is not intended to replace advice given to you by your health care provider. Make sure you discuss any questions you have with your health care provider. Document Revised: 03/21/2022 Document Reviewed: 03/21/2022 Elsevier Patient Education  [employer].

## 2022-09-23 NOTE — Progress Notes (Signed)
Patient tolerated bone marrow biopsy well with no issues. Biopsy site looks good with no bleeding. Patients vitals stable at discharge. Education given on after care of procedure. Patient voiced understanding.

## 2022-09-23 NOTE — Progress Notes (Addendum)
ALERT: Recent [facility] Treatment decision is outdated. Please await next [facility] decision 

## 2022-09-23 NOTE — Progress Notes (Signed)
START ON [facility] REGIMEN - Lymphoma and CLL     A cycle is every 7 days:     Rituximab-xxxx   **Always confirm dose/schedule in your pharmacy ordering system**  Patient Characteristics: Marginal Zone Lymphoma, Systemic, First Line, Symptomatic Disease Type: Marginal Zone Lymphoma Disease Type: Not Applicable Disease Type: Not Applicable Localized or Systemic Disease<= Systemic Line of Therapy: First Line Asymptomatic or Symptomatic<= Symptomatic Intent of Therapy: Non-Curative / Palliative Intent, Discussed with Patient 

## 2022-09-23 NOTE — Progress Notes (Signed)
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   Operator:  Wanita Chamberlain, MD  Consent process:  Benefits and potential risks of the procedure were discussed with the patient.   Risks include bruising and discomfort at the site.  Prolonged bleeding.  Infection.  Benefit is the establishment of a diagnosis and to obtain material for molecular testing.  Alternatives is to continue observation clinically at the risk that patients condition may worsen.  All questions were answered to patient's satisfaction.  Patient has agreed to proceed with the procedure  Technique:  After informed written and verbal consent was obtained from the patient they were placed in the prone position.  The region of the iliac crest was cleaned and draped in sterile fashion.  The region was then infiltrated with 10 cc of lidocaine solution with care taken to anesthetize the periostium using a spinal needle.  A skin incision was then made.  An 11 guage Jamshidi was passed through the skin incision and the outer table of the posterior iliac crest into the marrow cavity.  Three aspirates of 3 cc each of liquid marrow was obtained.  The needle was then advanced, core biopsy obtained and the needle was withdrawn.  Pressure was applied to effect hemostasis and a sterile dressing placed.  Patient tolerated procedure well   Specimens were sent for appropriate testing  Complications:  None.  Instructions for follow up were given to patient

## 2022-09-24 ENCOUNTER — Other Ambulatory Visit: Payer: Self-pay

## 2022-09-26 ENCOUNTER — Telehealth: Payer: Self-pay | Admitting: Oncology

## 2022-09-26 NOTE — Telephone Encounter (Signed)
Scheduled appt per 10/27 los. Pt is aware of appt date and time. Pt is aware to arrive 15 mins prior to appt time and to bring and updated insurance card. Pt is aware of appt location.

## 2022-09-27 ENCOUNTER — Other Ambulatory Visit: Payer: Self-pay

## 2022-09-27 DIAGNOSIS — D479 Neoplasm of uncertain behavior of lymphoid, hematopoietic and related tissue, unspecified: Secondary | ICD-10-CM

## 2022-09-27 DIAGNOSIS — R768 Other specified abnormal immunological findings in serum: Secondary | ICD-10-CM

## 2022-09-28 NOTE — Progress Notes (Unsigned)
[facility] Initial Visit:  Patient Care Team: Audley Hose, MD as PCP - General (Internal Medicine)  CHIEF COMPLAINTS/PURPOSE OF CONSULTATION:  Oncology History   No history exists.    HISTORY OF PRESENTING ILLNESS: Becky Cox 71 y.o. female is here because of anemia Medical history notable for thyroid nodule, hypothyroidism, morbid obesity, obstructive sleep apnea, bilateral hearing loss, hypertension, rheumatoid arthritis, chronic diarrhea, hypertension, gout  March 22 2022:  Colonoscopy.  3 very small polyps  July 29, 2022: WBC 7.2 hemoglobin 10.3 MCV 93 platelet count 311; 45 segs 44 lymphs 5 monos 5 eos 1 basophil.  Specimen was prewarmed to 37 degrees to obtain results.  Technician suspected possibility of cold agglutinins/cryoglobulins present Chemistries notable for creatinine 0.79 albumin 3.9.  AST ALT and alk phos normal T. bili 2.3 direct bilirubin 0.6.  Reticulocyte count 2.8%  September 09 2022:  [facility] Hematology Consult Was told years ago that she was anemic.   Has never taken oral iron.  No history of IV iron or PRBC's.   Does not take ASA.  Mainly takes Tylenol for pain; rarely ibuprofen No pica to ice.  Uses CPAP every night  Social:  Divorced.  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  Tobacco none.  EtOH  [facility] Mother died 47 MI Father died 5 pneumonia Sister died 33 CVA, MS Sister alive 30 colon cancer Sister alive 61 Parkinson's disease Sister alive 22 HTN Sister alive 70 osteoarthritis Brother alive 99 TBI from motorcycle accident, HTN Brother died 25 CVA Brother died 58 colon cancer  WBC 9.6 hemoglobin 10.2 MCV 97 platelet count 346; 55 segs 35 lymphs 5 monos 4 eos 1 basophil.  Reticulocyte count 3.3% Coombs test complement positive/IgG negative.  Haptoglobin undetectable.  Cold agglutinin titer greater than 1:4096 Hemoglobin electrophoresis normal adult pattern. SPEP with IEP showed no  paraprotein. IgG 624 IgA 121 IgM 74 Serum free kappa 25.1 lambda 28.3 with a kappa lambda 0.89 B12 500 folate 27.3 ferritin 242 Copper 128 zinc 97 CMP notable for T. 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  Having myalgias of legs.  Notes that her urine is dark  Review of Systems  Constitutional:  Negative for appetite change, chills, fatigue, fever and unexpected weight change.  HENT:   Negative for mouth sores, nosebleeds, sore throat, trouble swallowing and voice change.   Eyes:  Negative for eye problems and icterus.       Vision changes:  None  Respiratory:  Negative for chest tightness, cough, hemoptysis and wheezing.        Some DOE when does a lot of walking  Cardiovascular:  Positive for leg swelling. Negative for chest pain and palpitations.       PND:  none Orthopnea:  none  Gastrointestinal:  Negative for abdominal pain, blood in stool, constipation, diarrhea, nausea and vomiting.  Endocrine: Negative for hot flashes.       Cold intolerance:  none Heat intolerance:  none  Genitourinary:  Negative for bladder incontinence, difficulty urinating, dysuria, frequency, hematuria and nocturia.   Musculoskeletal:  Positive for arthralgias. Negative for back pain, gait problem, myalgias and neck pain.       Arthralgias of knees and ankles  Skin:  Negative for itching, rash and wound.  Neurological:  Negative for dizziness, extremity weakness, gait problem, headaches and numbness.  Hematological:  Negative for adenopathy. Does not bruise/bleed easily.  Psychiatric/Behavioral:  Negative for sleep disturbance and suicidal ideas. The patient is not nervous/anxious.     MEDICAL HISTORY: Past Medical History:   Diagnosis Date  . Fibroid   . Hypertension   . Thyroid disease    Nodules    SURGICAL HISTORY: Past Surgical History:  Procedure Laterality Date  . ABDOMINAL HYSTERECTOMY  1996  . ANKLE SURGERY  04/2011   BONE SPURS IN RIGHT ANKLE  . BREAST BIOPSY Left 2019   benign  . BREAST BIOPSY Right 2021  . FOOT SURGERY  2007 2008  . KNEE SURGERY     Arthroscopic  . MOLE REMOVAL  2009   BENIGN  . THYROID CYST EXCISION     2001    SOCIAL HISTORY: Social History   Socioeconomic History  . Marital status: Divorced    Spouse name: Not on file  . Number of children: Not on file  . Years of education: Not on file  . Highest education level: Not on file  Occupational History  . Not on file  Tobacco Use  . Smoking status: Never  . Smokeless tobacco: Never  Vaping Use  . Vaping Use: Never used  Substance and Sexual Activity  . Alcohol use: No    Alcohol/week: 0.0 standard drinks of alcohol  . Drug use: No  . Sexual activity: Not Currently    Comment: INTERCOURSE AGE UNKNOWN , SEXUAL PARTNERS MOE THAN 6  Other Topics Concern  . Not on file  Social History Narrative  . Not on file   Social Determinants of Health   Financial Resource Strain: Not on file  Food Insecurity: Not on file  Transportation Needs: Not on file  Physical Activity: Not on file  Stress: Not on file  Social Connections: Not on file  Intimate Partner Violence: Not on file    FAMILY HISTORY Family History  Problem Relation Age of Onset  . Breast cancer Sister 32  . Hypertension Sister   . Cancer Sister        COLON  . Stroke Brother   . Hypertension Brother   . Breast cancer Maternal Grandmother     ALLERGIES:  has No Known Allergies.  MEDICATIONS:  Current Outpatient Medications  Medication Sig Dispense Refill  . allopurinol (ZYLOPRIM) 300 MG tablet Take 1 tablet by mouth daily.    Marland Kitchen amLODipine (NORVASC) 5 MG tablet Take 10 tablets by mouth daily.     . Ascorbic Acid (VITAMIN C PO) Take  by mouth.    Marland Kitchen atorvastatin (LIPITOR) 10 MG tablet Take 1 tablet by mouth daily.    . diclofenac Sodium (VOLTAREN) 1 % GEL Apply topically.    . fluticasone (FLONASE) 50 MCG/ACT nasal spray Place 2 sprays into the nose daily.    . folic acid (FOLVITE) 1 MG tablet Take 1 mg by mouth daily.    Marland Kitchen ipratropium (ATROVENT) 0.06 % nasal spray Place 2 sprays into both nostrils 4 (four) times daily. 15 mL 2  . levothyroxine (SYNTHROID) 100 MCG tablet Take 100 mcg by mouth daily.    . methotrexate (RHEUMATREX) 2.5 MG tablet take 6 tablets    . montelukast (SINGULAIR) 10 MG tablet Take 1 tablet by mouth at bedtime.    . Multiple Vitamin (MULTIVITAMIN PO) Take 1 tablet by mouth daily.    Marland Kitchen  triamterene-hydrochlorothiazide (MAXZIDE-25) 37.5-25 MG tablet Take 1 tablet by mouth daily.     No current facility-administered medications for this visit.    PHYSICAL EXAMINATION:  ECOG PERFORMANCE STATUS: 1 - Symptomatic but completely ambulatory   There were no vitals filed for this visit.   There were no vitals filed for this visit.    Physical Exam Vitals and nursing note reviewed.  Constitutional:      General: She is not in acute distress.    Appearance: Normal appearance. She is obese. She is not ill-appearing, toxic-appearing or diaphoretic.     Comments: Here alone  HENT:     Head: Normocephalic and atraumatic.     Right Ear: External ear normal.     Left Ear: External ear normal.     Nose: Nose normal. No congestion or rhinorrhea.  Eyes:     General: No scleral icterus.    Extraocular Movements: Extraocular movements intact.     Conjunctiva/sclera: Conjunctivae normal.     Pupils: Pupils are equal, round, and reactive to light.  Cardiovascular:     Rate and Rhythm: Normal rate and regular rhythm.     Heart sounds: Normal heart sounds. No murmur heard.    No friction rub. No gallop.  Pulmonary:     Effort: Pulmonary effort is normal. No respiratory distress.     Breath sounds: No  stridor. Rhonchi present. No wheezing or rales.  Abdominal:     General: Bowel sounds are normal. There is no distension.     Palpations: Abdomen is soft. There is no mass.     Tenderness: There is no abdominal tenderness. There is no guarding or rebound.     Hernia: No hernia is present.  Musculoskeletal:        General: No swelling, tenderness, deformity or signs of injury.     Cervical back: Normal range of motion and neck supple. No rigidity or tenderness.     Right lower leg: Edema present.     Left lower leg: Edema present.  Lymphadenopathy:     Head:     Right side of head: No submental, submandibular, tonsillar, preauricular, posterior auricular or occipital adenopathy.     Left side of head: No submental, submandibular, tonsillar, preauricular, posterior auricular or occipital adenopathy.     Cervical: No cervical adenopathy.     Right cervical: No superficial, deep or posterior cervical adenopathy.    Left cervical: No superficial, deep or posterior cervical adenopathy.     Upper Body:     Right upper body: No supraclavicular, axillary, pectoral or epitrochlear adenopathy.     Left upper body: No supraclavicular, axillary, pectoral or epitrochlear adenopathy.  Skin:    General: Skin is warm.     Coloration: Skin is not jaundiced or pale.     Findings: No bruising or erythema.  Neurological:     General: No focal deficit present.     Mental Status: She is alert and oriented to person, place, and time. Mental status is at baseline.     Cranial Nerves: No cranial nerve deficit.     Motor: No weakness.     Gait: Gait normal.  Psychiatric:        Mood and Affect: Mood normal.        Behavior: Behavior normal.        Thought Content: Thought content normal.        Judgment: Judgment normal.    LABORATORY DATA: I have personally reviewed the  data as listed:  Clinical Support on 09/23/2022  Component Date Value Ref Range Status  . WBC Count 09/23/2022 6.6  4.0 - 10.5 K/uL  Final  . RBC 09/23/2022 2.70 (L)  3.87 - 5.11 MIL/uL Final  . Hemoglobin 09/23/2022 8.8 (L)  12.0 - 15.0 g/dL Final  . HCT 09/23/2022 25.7 (L)  36.0 - 46.0 % Final  . MCV 09/23/2022 95.2  80.0 - 100.0 fL Final  . MCH 09/23/2022 32.6  26.0 - 34.0 pg Final  . MCHC 09/23/2022 34.2  30.0 - 36.0 g/dL Final  . RDW 09/23/2022 17.6 (H)  11.5 - 15.5 % Final  . Platelet Count 09/23/2022 306  150 - 400 K/uL Final  . nRBC 09/23/2022 0.3 (H)  0.0 - 0.2 % Final  . Neutrophils Relative % 09/23/2022 49  % Final  . Neutro Abs 09/23/2022 3.3  1.7 - 7.7 K/uL Final  . Lymphocytes Relative 09/23/2022 35  % Final  . Lymphs Abs 09/23/2022 2.3  0.7 - 4.0 K/uL Final  . Monocytes Relative 09/23/2022 8  % Final  . Monocytes Absolute 09/23/2022 0.5  0.1 - 1.0 K/uL Final  . Eosinophils Relative 09/23/2022 6  % Final  . Eosinophils Absolute 09/23/2022 0.4  0.0 - 0.5 K/uL Final  . Basophils Relative 09/23/2022 1  % Final  . Basophils Absolute 09/23/2022 0.1  0.0 - 0.1 K/uL Final  . Immature Granulocytes 09/23/2022 1  % Final  . Abs Immature Granulocytes 09/23/2022 0.03  0.00 - 0.07 K/uL Final   Performed at [facility], [place] [address], [place], [place] [address]  Appointment on 09/09/2022  Component Date Value Ref Range Status  . Haptoglobin 09/09/2022 <10 (L)  37 - 355 mg/dL Final   Comment: (NOTE) Performed At: [facility] [place], [place] [id number] Rush Farmer MD ON:[id number]   . Zinc 09/09/2022 97  44 - 115 ug/dL Final   Comment: (NOTE) This test was developed and its performance characteristics determined by [facility]. It has not been cleared or approved by the Food and Drug Administration.                                Detection Limit = 5 Performed At: [facility] [address] [place], [place] [id number] Rush Farmer MD DG:[id number]   . Vitamin B-12 09/09/2022 500  180 - 914 pg/mL Final   Comment: (NOTE) This assay is not validated  for testing neonatal or myeloproliferative syndrome specimens for Vitamin B12 levels. Performed at [facility], [place] [address] [place], [place] [address]   . Retic Ct Pct 09/09/2022 3.3 (H)  0.4 - 3.1 % Final  . RBC. 09/09/2022 3.34 (L)  3.87 - 5.11 MIL/uL Final  . Retic Count, Absolute 09/09/2022 109.9  19.0 - 186.0 K/uL Final  . Immature Retic Fract 09/09/2022 11.6  2.3 - 15.9 % Final   Performed at [facility], [place] [address] [place], [place] [address]  . Kappa free light chain 09/09/2022 25.1 (H)  3.3 - 19.4 mg/L Final  . Lambda free light chains 09/09/2022 28.3 (H)  5.7 - 26.3 mg/L Final  . Kappa, lambda light chain ratio 09/09/2022 0.89  0.26 - 1.65 Final   Comment: (NOTE) Performed At: [facility] [place], [place] [id number] Rush Farmer MD YS:[id number]   . IgG (Immunoglobin G), Serum 09/09/2022 624  586 -  1,602 mg/dL Final  . IgA 09/09/2022 121  87 - 352 mg/dL Final  . IgM (Immunoglobulin M), Srm 09/09/2022 74  26 - 217 mg/dL Final  . Total Protein ELP 09/09/2022 6.0  6.0 - 8.5 g/dL Corrected  . Albumin SerPl Elph-Mcnc 09/09/2022 3.7  2.9 - 4.4 g/dL Corrected  . Alpha 1 09/09/2022 0.3  0.0 - 0.4 g/dL Corrected  . Alpha2 Glob SerPl Elph-Mcnc 09/09/2022 0.4  0.4 - 1.0 g/dL Corrected  . B-Globulin SerPl Elph-Mcnc 09/09/2022 1.0  0.7 - 1.3 g/dL Corrected  . Gamma Glob SerPl Elph-Mcnc 09/09/2022 0.5  0.4 - 1.8 g/dL Corrected  . M Protein SerPl Elph-Mcnc 09/09/2022 Not Observed  Not Observed g/dL Corrected  . Globulin, Total 09/09/2022 2.3  2.2 - 3.9 g/dL Corrected  . Albumin/Glob SerPl 09/09/2022 1.7  0.7 - 1.7 Corrected  . IFE 1 09/09/2022 Comment   Corrected   Comment: (NOTE) The immunofixation pattern appears unremarkable. Evidence of monoclonal protein is not apparent.   . Please Note 09/09/2022 Comment   Corrected   Comment: (NOTE) Protein electrophoresis scan will follow via computer, mail,  or courier delivery. Performed At: [facility] [place], [place] [id number] Rush Farmer MD VQ:[id number]   . Hgb F 09/09/2022 0.4  0.0 - 2.0 % Final  . Hgb A 09/09/2022 97.0  96.4 - 98.8 % Final  . Hgb A2 09/09/2022 2.6  1.8 - 3.2 % Final  . Hgb S 09/09/2022 0.0  0.0 % Final  . Interpretation, Hgb Fract 09/09/2022 Comment   Final   Comment: (NOTE) Normal hemoglobin present; no hemoglobin variant or beta thalassemia identified. Note: Alpha thalassemia may not be detected by the Hgb Fractionation Cascade panel. If alpha thalassemia is suspected, [facility] offers Alpha-Thalassemia DNA Analysis [phone]). Performed At: [facility] [place], [place] [id number] Rush Farmer MD JA:[id number]   . Folate 09/09/2022 27.3  >5.9 ng/mL Final   Comment: RESULT CONFIRMED BY MANUAL DILUTION Performed at [place] [address], [place], [place] [address]   . Ferritin 09/09/2022 242  11 - 307 ng/mL Final   Performed at [facility], [address] [place], [place] [address]  . DAT, complement 09/09/2022 POS   Final  . DAT, IgG 09/09/2022    Final                   Value:NEG Performed at [facility], [place] [address] [place], [place] [address]   . Copper 09/09/2022 128  80 - 158 ug/dL Final   Comment: (NOTE) This test was developed and its performance characteristics determined by [facility]. It has not been cleared or approved by the Food and Drug Administration.                                Detection Limit = 5 Performed At: [facility] [address] [place], [place] [id number] Rush Farmer MD MH:[id number]   . Sodium 09/09/2022 142  135 - 145 mmol/L Final  . Potassium 09/09/2022 3.7  3.5 - 5.1 mmol/L Final  . Chloride 09/09/2022 108  98 - 111 mmol/L Final  . CO2 09/09/2022 30  22 - 32 mmol/L Final  . Glucose, Bld 09/09/2022 102 (H)  70 - 99 mg/dL Final   Glucose  reference range applies only to samples taken after fasting for at least 8 hours.  . BUN 09/09/2022 17  8 -  23 mg/dL Final  . Creatinine 09/09/2022 0.72  0.44 - 1.00 mg/dL Final  . Calcium 09/09/2022 9.2  8.9 - 10.3 mg/dL Final  . Total Protein 09/09/2022 6.5  6.5 - 8.1 g/dL Final  . Albumin 09/09/2022 4.1  3.5 - 5.0 g/dL Final  . AST 09/09/2022 35  15 - 41 U/L Final  . ALT 09/09/2022 32  0 - 44 U/L Final  . Alkaline Phosphatase 09/09/2022 101  38 - 126 U/L Final  . Total Bilirubin 09/09/2022 3.3 (H)  0.3 - 1.2 mg/dL Final  . GFR, Estimated 09/09/2022 >60  >60 mL/min Final   Comment: (NOTE) Calculated using the CKD-EPI Creatinine Equation (2021)   . Anion gap 09/09/2022 4 (L)  5 - 15 Final   Performed at [facility], [place] [address] [place], [place] [address]  . WBC Count 09/09/2022 9.6  4.0 - 10.5 K/uL Final  . RBC 09/09/2022 2.94 (L)  3.87 - 5.11 MIL/uL Final  . Hemoglobin 09/09/2022 10.2 (L)  12.0 - 15.0 g/dL Final  . HCT 09/09/2022 28.4 (L)  36.0 - 46.0 % Final  . MCV 09/09/2022 96.6  80.0 - 100.0 fL Final  . MCH 09/09/2022 34.7 (H)  26.0 - 34.0 pg Final  . MCHC 09/09/2022 35.9  30.0 - 36.0 g/dL Final  . RDW 09/09/2022 19.8 (H)  11.5 - 15.5 % Final  . Platelet Count 09/09/2022 346  150 - 400 K/uL Final  . nRBC 09/09/2022 0.0  0.0 - 0.2 % Final  . Neutrophils Relative % 09/09/2022 55  % Final  . Neutro Abs 09/09/2022 5.2  1.7 - 7.7 K/uL Final  . Lymphocytes Relative 09/09/2022 35  % Final  . Lymphs Abs 09/09/2022 3.3  0.7 - 4.0 K/uL Final  . Monocytes Relative 09/09/2022 5  % Final  . Monocytes Absolute 09/09/2022 0.5  0.1 - 1.0 K/uL Final  . Eosinophils Relative 09/09/2022 4  % Final  . Eosinophils Absolute 09/09/2022 0.4  0.0 - 0.5 K/uL Final  . Basophils Relative 09/09/2022 1  % Final  . Basophils Absolute 09/09/2022 0.1  0.0 - 0.1 K/uL Final  . Immature Granulocytes 09/09/2022 0  % Final  . Abs Immature Granulocytes 09/09/2022 0.03  0.00 - 0.07  K/uL Final   Performed at [facility], [place] [address], [place], [place] [address]  . Cold Agglutinin Titer 09/09/2022 >1:4096  Neg <1:32 Final   Comment: (NOTE) Performed At: [facility] [place], [place] [id number] Rush Farmer MD WG:[id number]     RADIOGRAPHIC STUDIES: I have personally reviewed the radiological images as listed and agree with the findings in the report  No results found.  ASSESSMENT/PLAN  71 y.o. female with medical history notable for thyroid nodule, hypothyroidism, morbid obesity, obstructive sleep apnea, bilateral hearing loss, hypertension, rheumatoid arthritis, chronic diarrhea, hypertension, gout.  Patient is seen for evaluation and management of anemia  Anemia:  Labs most notable for the observation that there was agglutination noted on the peripheral blood smear.  Will obtain Obtain CBC with diff, CMP, retic count, Ferritin B12, folate, DAT, Haptoglobin, SPEP with IEP, free light chains and smear for morphology review.  In addition will obtain Cold agglutinin titer     Cancer Staging  No matching staging information was found for the patient.   No problem-specific Assessment & Plan notes found for this encounter.   No orders of the defined types were placed in this encounter.   All  questions were answered. The patient knows to call the clinic with any problems, questions or concerns.  This note was electronically signed.    Barbee Cough, MD  09/28/2022 1:31 PM

## 2022-09-30 ENCOUNTER — Other Ambulatory Visit: Payer: Self-pay

## 2022-09-30 ENCOUNTER — Inpatient Hospital Stay: Payer: Medicare PPO | Attending: Oncology | Admitting: Oncology

## 2022-09-30 VITALS — BP 135/51 | HR 78 | Temp 98.4°F | Resp 16 | Wt 258.2 lb

## 2022-09-30 DIAGNOSIS — H9193 Unspecified hearing loss, bilateral: Secondary | ICD-10-CM | POA: Insufficient documentation

## 2022-09-30 DIAGNOSIS — Z7189 Other specified counseling: Secondary | ICD-10-CM

## 2022-09-30 DIAGNOSIS — D649 Anemia, unspecified: Secondary | ICD-10-CM | POA: Insufficient documentation

## 2022-09-30 DIAGNOSIS — M549 Dorsalgia, unspecified: Secondary | ICD-10-CM | POA: Insufficient documentation

## 2022-09-30 DIAGNOSIS — M109 Gout, unspecified: Secondary | ICD-10-CM | POA: Insufficient documentation

## 2022-09-30 DIAGNOSIS — T451X5A Adverse effect of antineoplastic and immunosuppressive drugs, initial encounter: Secondary | ICD-10-CM | POA: Insufficient documentation

## 2022-09-30 DIAGNOSIS — G4733 Obstructive sleep apnea (adult) (pediatric): Secondary | ICD-10-CM | POA: Insufficient documentation

## 2022-09-30 DIAGNOSIS — R6883 Chills (without fever): Secondary | ICD-10-CM | POA: Diagnosis not present

## 2022-09-30 DIAGNOSIS — D479 Neoplasm of uncertain behavior of lymphoid, hematopoietic and related tissue, unspecified: Secondary | ICD-10-CM | POA: Diagnosis present

## 2022-09-30 DIAGNOSIS — R110 Nausea: Secondary | ICD-10-CM | POA: Diagnosis not present

## 2022-09-30 DIAGNOSIS — Z5112 Encounter for antineoplastic immunotherapy: Secondary | ICD-10-CM | POA: Insufficient documentation

## 2022-09-30 DIAGNOSIS — E039 Hypothyroidism, unspecified: Secondary | ICD-10-CM | POA: Insufficient documentation

## 2022-09-30 DIAGNOSIS — M069 Rheumatoid arthritis, unspecified: Secondary | ICD-10-CM | POA: Diagnosis not present

## 2022-09-30 DIAGNOSIS — R768 Other specified abnormal immunological findings in serum: Secondary | ICD-10-CM

## 2022-09-30 DIAGNOSIS — I10 Essential (primary) hypertension: Secondary | ICD-10-CM | POA: Diagnosis not present

## 2022-09-30 DIAGNOSIS — Z9889 Other specified postprocedural states: Secondary | ICD-10-CM

## 2022-09-30 LAB — SURGICAL PATHOLOGY

## 2022-10-01 ENCOUNTER — Other Ambulatory Visit: Payer: Self-pay

## 2022-10-03 ENCOUNTER — Encounter: Payer: Self-pay | Admitting: Oncology

## 2022-10-03 DIAGNOSIS — Z7189 Other specified counseling: Secondary | ICD-10-CM | POA: Insufficient documentation

## 2022-10-03 DIAGNOSIS — M069 Rheumatoid arthritis, unspecified: Secondary | ICD-10-CM | POA: Insufficient documentation

## 2022-10-03 DIAGNOSIS — Z9889 Other specified postprocedural states: Secondary | ICD-10-CM | POA: Insufficient documentation

## 2022-10-04 ENCOUNTER — Other Ambulatory Visit: Payer: Self-pay

## 2022-10-04 ENCOUNTER — Ambulatory Visit: Payer: Medicare PPO

## 2022-10-04 ENCOUNTER — Encounter (HOSPITAL_COMMUNITY): Payer: Self-pay | Admitting: Oncology

## 2022-10-04 ENCOUNTER — Other Ambulatory Visit: Payer: Medicare PPO

## 2022-10-06 ENCOUNTER — Inpatient Hospital Stay: Payer: Medicare PPO

## 2022-10-07 ENCOUNTER — Inpatient Hospital Stay: Payer: Medicare PPO

## 2022-10-07 ENCOUNTER — Other Ambulatory Visit: Payer: Medicare PPO

## 2022-10-07 ENCOUNTER — Inpatient Hospital Stay (HOSPITAL_BASED_OUTPATIENT_CLINIC_OR_DEPARTMENT_OTHER): Payer: Medicare PPO | Admitting: Physician Assistant

## 2022-10-07 ENCOUNTER — Ambulatory Visit: Payer: Medicare PPO | Admitting: Oncology

## 2022-10-07 VITALS — BP 109/64 | HR 86 | Temp 98.4°F | Resp 18 | Wt 259.4 lb

## 2022-10-07 DIAGNOSIS — R768 Other specified abnormal immunological findings in serum: Secondary | ICD-10-CM

## 2022-10-07 DIAGNOSIS — R110 Nausea: Secondary | ICD-10-CM | POA: Diagnosis not present

## 2022-10-07 DIAGNOSIS — D479 Neoplasm of uncertain behavior of lymphoid, hematopoietic and related tissue, unspecified: Secondary | ICD-10-CM

## 2022-10-07 DIAGNOSIS — M549 Dorsalgia, unspecified: Secondary | ICD-10-CM | POA: Diagnosis not present

## 2022-10-07 DIAGNOSIS — Z5112 Encounter for antineoplastic immunotherapy: Secondary | ICD-10-CM | POA: Diagnosis not present

## 2022-10-07 DIAGNOSIS — T451X5A Adverse effect of antineoplastic and immunosuppressive drugs, initial encounter: Secondary | ICD-10-CM | POA: Diagnosis not present

## 2022-10-07 DIAGNOSIS — R6883 Chills (without fever): Secondary | ICD-10-CM | POA: Diagnosis not present

## 2022-10-07 LAB — HEPATITIS B SURFACE ANTIGEN: Hepatitis B Surface Ag: NONREACTIVE

## 2022-10-07 LAB — HEPATITIS B CORE ANTIBODY, TOTAL: Hep B Core Total Ab: NONREACTIVE

## 2022-10-07 MED ORDER — DIPHENHYDRAMINE HCL 25 MG PO CAPS
50.0000 mg | ORAL_CAPSULE | Freq: Once | ORAL | Status: AC
  Administered 2022-10-07: 50 mg via ORAL
  Filled 2022-10-07: qty 2

## 2022-10-07 MED ORDER — SODIUM CHLORIDE 0.9 % IV SOLN: Freq: Once | INTRAVENOUS | Status: AC

## 2022-10-07 MED ORDER — ALBUTEROL SULFATE HFA 108 (90 BASE) MCG/ACT IN AERS: 2.0000 | INHALATION_SPRAY | Freq: Once | RESPIRATORY_TRACT | Status: DC | PRN

## 2022-10-07 MED ORDER — METHYLPREDNISOLONE SODIUM SUCC 125 MG IJ SOLR
125.0000 mg | Freq: Once | INTRAMUSCULAR | Status: AC | PRN
  Administered 2022-10-07: 125 mg via INTRAVENOUS

## 2022-10-07 MED ORDER — SODIUM CHLORIDE 0.9 % IV SOLN: 8.0000 mg | Freq: Once | INTRAVENOUS | Status: DC

## 2022-10-07 MED ORDER — ACETAMINOPHEN 325 MG PO TABS
650.0000 mg | ORAL_TABLET | Freq: Once | ORAL | Status: AC
  Administered 2022-10-07: 650 mg via ORAL
  Filled 2022-10-07: qty 2

## 2022-10-07 MED ORDER — DIPHENHYDRAMINE HCL 50 MG/ML IJ SOLN: 50.0000 mg | Freq: Once | INTRAMUSCULAR | Status: DC | PRN

## 2022-10-07 MED ORDER — ONDANSETRON HCL 4 MG/2ML IJ SOLN
8.0000 mg | Freq: Once | INTRAMUSCULAR | Status: AC
  Administered 2022-10-07: 8 mg via INTRAVENOUS
  Filled 2022-10-07: qty 4

## 2022-10-07 MED ORDER — EPINEPHRINE 0.3 MG/0.3ML IJ SOAJ: 0.3000 mg | Freq: Once | INTRAMUSCULAR | Status: DC | PRN

## 2022-10-07 MED ORDER — SODIUM CHLORIDE 0.9 % IV SOLN
375.0000 mg/m2 | Freq: Once | INTRAVENOUS | Status: AC
  Administered 2022-10-07: 900 mg via INTRAVENOUS
  Filled 2022-10-07: qty 50

## 2022-10-07 MED ORDER — FAMOTIDINE IN NACL 20-0.9 MG/50ML-% IV SOLN
20.0000 mg | Freq: Once | INTRAVENOUS | Status: AC | PRN
  Administered 2022-10-07: 20 mg via INTRAVENOUS

## 2022-10-07 MED ORDER — SODIUM CHLORIDE 0.9 % IV SOLN: Freq: Once | INTRAVENOUS | Status: DC | PRN

## 2022-10-07 MED ORDER — MORPHINE SULFATE (PF) 2 MG/ML IV SOLN
INTRAVENOUS | Status: AC
  Administered 2022-10-07: 2 mg via INTRAVENOUS
  Filled 2022-10-07: qty 1

## 2022-10-07 NOTE — Patient Instructions (Signed)
[facility]  Discharge Instructions: Thank you for choosing [facility] to provide your oncology and hematology care.   If you have a lab appointment with the [facility], please go directly to the [facility] and check in at the registration area.   Wear comfortable clothing and clothing appropriate for easy access to any Portacath or PICC line.   We strive to give you quality time with your provider. You may need to reschedule your appointment if you arrive late (15 or more minutes).  Arriving late affects you and other patients whose appointments are after yours.  Also, if you miss three or more appointments without notifying the office, you may be dismissed from the clinic at the provider's discretion.      For prescription refill requests, have your pharmacy contact our office and allow 72 hours for refills to be completed.    Today you received the following chemotherapy and/or immunotherapy agents: Rituximab.       To help prevent nausea and vomiting after your treatment, we encourage you to take your nausea medication as directed.  BELOW ARE SYMPTOMS THAT SHOULD BE REPORTED IMMEDIATELY: *FEVER GREATER THAN 100.4 F (38 C) OR HIGHER *CHILLS OR SWEATING *NAUSEA AND VOMITING THAT IS NOT CONTROLLED WITH YOUR NAUSEA MEDICATION *UNUSUAL SHORTNESS OF BREATH *UNUSUAL BRUISING OR BLEEDING *URINARY PROBLEMS (pain or burning when urinating, or frequent urination) *BOWEL PROBLEMS (unusual diarrhea, constipation, pain near the anus) TENDERNESS IN MOUTH AND THROAT WITH OR WITHOUT PRESENCE OF ULCERS (sore throat, sores in mouth, or a toothache) UNUSUAL RASH, SWELLING OR PAIN  UNUSUAL VAGINAL DISCHARGE OR ITCHING   Items with * indicate a potential emergency and should be followed up as soon as possible or go to the Emergency Department if any problems should occur.  Please show the CHEMOTHERAPY ALERT CARD or IMMUNOTHERAPY ALERT CARD at check-in  to the Emergency Department and triage nurse.  Should you have questions after your visit or need to cancel or reschedule your appointment, please contact [facility]  Dept: [phone]  and follow the prompts.  Office hours are 8:00 a.m. to 4:30 p.m. Monday - Friday. Please note that voicemails left after 4:00 p.m. may not be returned until the following business day.  We are closed weekends and major holidays. You have access to a nurse at all times for urgent questions. Please call the main number to the clinic Dept: [phone] and follow the prompts.   For any non-urgent questions, you may also contact your provider using MyChart. We now offer e-Visits for anyone 54 and older to request care online for non-urgent symptoms. For details visit [email or web address].   Also download the MyChart app! Go to the app store, search "MyChart", open the app, select [facility], and log in with your MyChart username and password.  Masks are optional in the cancer centers. If you would like for your care team to wear a mask while they are taking care of you, please let them know. You may have one support person who is at least 70 years old accompany you for your appointments. Rituximab Injection What is this medication? RITUXIMAB (ri TUX i mab) treats leukemia and lymphoma. It works by blocking a protein that causes cancer cells to grow and multiply. This helps to slow or stop the spread of cancer cells. It may also be used to treat autoimmune conditions, such as arthritis. It works by slowing down an overactive immune system.  It is a monoclonal antibody. This medicine may be used for other purposes; ask your health care provider or pharmacist if you have questions. COMMON BRAND NAME(S): RIABNI, Rituxan, RUXIENCE, truxima What should I tell my care team before I take this medication? They need to know if you have any of these conditions: Chest pain Heart disease Immune  system problems Infection, such as chickenpox, cold sores, hepatitis B, herpes Irregular heartbeat or rhythm Kidney disease Low blood counts, such as low white cells, platelets, red cells Lung disease Recent or upcoming vaccine An unusual or allergic reaction to rituximab, other medications, foods, dyes, or preservatives Pregnant or trying to get pregnant Breast-feeding How should I use this medication? This medication is injected into a vein. It is given by a care team in a hospital or clinic setting. A special MedGuide will be given to you before each treatment. Be sure to read this information carefully each time. Talk to your care team about the use of this medication in children. While this medication may be prescribed for children as young as 6 months for selected conditions, precautions do apply. Overdosage: If you think you have taken too much of this medicine contact a poison control center or emergency room at once. NOTE: This medicine is only for you. Do not share this medicine with others. What if I miss a dose? Keep appointments for follow-up doses. It is important not to miss your dose. Call your care team if you are unable to keep an appointment. What may interact with this medication? Do not take this medication with any of the following: Live vaccines This medication may also interact with the following: Cisplatin This list may not describe all possible interactions. Give your health care provider a list of all the medicines, herbs, non-prescription drugs, or dietary supplements you use. Also tell them if you smoke, drink alcohol, or use illegal drugs. Some items may interact with your medicine. What should I watch for while using this medication? Your condition will be monitored carefully while you are receiving this medication. You may need blood work while taking this medication. This medication can cause serious infusion reactions. To reduce the risk your care team may  give you other medications to take before receiving this one. Be sure to follow the directions from your care team. This medication may increase your risk of getting an infection. Call your care team for advice if you get a fever, chills, sore throat, or other symptoms of a cold or flu. Do not treat yourself. Try to avoid being around people who are sick. Call your care team if you are around anyone with measles, chickenpox, or if you develop sores or blisters that do not heal properly. Avoid taking medications that contain aspirin, acetaminophen, ibuprofen, naproxen, or ketoprofen unless instructed by your care team. These medications may hide a fever. This medication may cause serious skin reactions. They can happen weeks to months after starting the medication. Contact your care team right away if you notice fevers or flu-like symptoms with a rash. The rash may be red or purple and then turn into blisters or peeling of the skin. You may also notice a red rash with swelling of the face, lips, or lymph nodes in your neck or under your arms. In some patients, this medication may cause a serious brain infection that may cause death. If you have any problems seeing, thinking, speaking, walking, or standing, tell your care team right away. If you cannot reach your  care team, urgently seek another source of medical care. Talk to your care team if you may be pregnant. Serious birth defects can occur if you take this medication during pregnancy and for 12 months after the last dose. You will need a negative pregnancy test before starting this medication. Contraception is recommended while taking this medication and for 12 months after the last dose. Your care team can help you find the option that works for you. Do not breastfeed while taking this medication and for at least 6 months after the last dose. What side effects may I notice from receiving this medication? Side effects that you should report to your  care team as soon as possible: Allergic reactions or angioedema--skin rash, itching or hives, swelling of the face, eyes, lips, tongue, arms, or legs, trouble swallowing or breathing Bowel blockage--stomach cramping, unable to have a bowel movement or pass gas, loss of appetite, vomiting Dizziness, loss of balance or coordination, confusion or trouble speaking Heart attack--pain or tightness in the chest, shoulders, arms, or jaw, nausea, shortness of breath, cold or clammy skin, feeling faint or lightheaded Heart rhythm changes--fast or irregular heartbeat, dizziness, feeling faint or lightheaded, chest pain, trouble breathing Infection--fever, chills, cough, sore throat, wounds that don't heal, pain or trouble when passing urine, general feeling of discomfort or being unwell Infusion reactions--chest pain, shortness of breath or trouble breathing, feeling faint or lightheaded Kidney injury--decrease in the amount of urine, swelling of the ankles, hands, or feet Liver injury--right upper belly pain, loss of appetite, nausea, light-colored stool, dark yellow or brown urine, yellowing skin or eyes, unusual weakness or fatigue Redness, blistering, peeling, or loosening of the skin, including inside the mouth Stomach pain that is severe, does not go away, or gets worse Tumor lysis syndrome (TLS)--nausea, vomiting, diarrhea, decrease in the amount of urine, dark urine, unusual weakness or fatigue, confusion, muscle pain or cramps, fast or irregular heartbeat, joint pain Side effects that usually do not require medical attention (report to your care team if they continue or are bothersome): Headache Joint pain Nausea Runny or stuffy nose Unusual weakness or fatigue This list may not describe all possible side effects. Call your doctor for medical advice about side effects. You may report side effects to FDA at 1-800-FDA-1088. Where should I keep my medication? This medication is given in a hospital or  clinic. It will not be stored at home. NOTE: This sheet is a summary. It may not cover all possible information. If you have questions about this medicine, talk to your doctor, pharmacist, or health care provider.  2023 Elsevier/Gold Standard (2022-03-29 00:00:00)

## 2022-10-07 NOTE — Progress Notes (Signed)
    DATE:  10/07/22                                        X CHEMO/IMMUNOTHERAPY REACTION            MD: Federico Flake   AGENT/BLOOD PRODUCT RECEIVING TODAY:              Rituximab-arrx   AGENT/BLOOD PRODUCT RECEIVING IMMEDIATELY PRIOR TO REACTION:          rituximab-arrx   VS: BP:     93/56   P:       99       SPO2:       95 % RA                BP:     113/67   P:       80       SPO2:       100 % 2L     REACTION(S):           rigors, back pain, nausea   PREMEDS:     benadryl 50 mg PO, Tylenol 650 mg PO   INTERVENTION: Pepcid 20 mg IV, Solumedrol 125 mg IV  '2mg'$  morphine IV   Review of Systems  Review of Systems  Constitutional:  Positive for chills.  Gastrointestinal:  Positive for nausea.  Musculoskeletal:  Positive for back pain.  All other systems reviewed and are negative.    Physical Exam  Physical Exam Vitals and nursing note reviewed.  Constitutional:      General: She is in acute distress.     Appearance: She is well-developed. She is not ill-appearing or toxic-appearing.     Comments: rigors  HENT:     Head: Normocephalic.     Nose: Nose normal.  Eyes:     Conjunctiva/sclera: Conjunctivae normal.  Neck:     Vascular: No JVD.  Cardiovascular:     Rate and Rhythm: Normal rate and regular rhythm.     Pulses: Normal pulses.     Heart sounds: Normal heart sounds.  Pulmonary:     Effort: Pulmonary effort is normal. No respiratory distress.     Breath sounds: Normal breath sounds. No stridor. No wheezing, rhonchi or rales.  Chest:     Chest wall: No tenderness.  Abdominal:     General: There is no distension.     Tenderness: There is no abdominal tenderness.  Musculoskeletal:     Cervical back: Normal range of motion.  Skin:    General: Skin is warm and dry.  Neurological:     Mental Status: She is oriented to person, place, and time.    OUTCOME:    Patient with reaction during first time rituxan infusion. Emergency medications given as above. Patient  returned to baseline. Dr. Federico Flake made aware of reaction and is agreeable with plan to re-challenge. Patient tolerated remainder of infusion well.        I have spent a total of 20 minutes minutes of face-to-face and non-face-to-face time, preparing to see the patient, obtaining and/or reviewing separately obtained history, performing a medically appropriate examination, counseling and educating the patient, ordering medications, communicating with other health care professionals, documenting clinical information in the electronic health record,  and communicating results to the patient, and care coordination.

## 2022-10-07 NOTE — Addendum Note (Signed)
Addended by: Wanita Chamberlain on: 10/07/2022 08:47 AM   Modules accepted: Orders

## 2022-10-07 NOTE — Progress Notes (Signed)
Hypersensitivity Reaction note  Date of event: 10/07/22 Time of event: 1045 Generic name of drug involved: Rituximab Name of provider notified of the hypersensitivity reaction: Sherol Dade, Karle Starch Ribacove Was agent that likely caused hypersensitivity reaction added to Allergies List within EMR? Yes Chain of events including reaction signs/symptoms, treatment administered, and outcome (e.g., drug resumed; drug discontinued; sent to Emergency Department; etc.)   Pt reported urinary frequency after medication administration began. At 1045, pt exited bathroom c/o coldness. Infusion stopped. Pt began shaking and c/o back pain. 1L NS started. PA KW called to bedside. Pepcid IV, Morphine '2mg'$ , and Solu-Medrol '125mg'$  administered. 2L [place] administered for comfort. Pt began reporting resolution of symptoms (see flowsheets for V/S). MD consulted for next steps.   Rafael Bihari, RN 10/07/2022 11:10 AM

## 2022-10-07 NOTE — Progress Notes (Signed)
Pharmacist Chemotherapy Monitoring - Initial Assessment    Anticipated start date: 10/07/2022   The following has been reviewed per standard work regarding the patient's treatment regimen: The patient's diagnosis, treatment plan and drug doses, and organ/hematologic function The treatment plan start date, drug sequencing, and pre-medications Prior authorization status  Patient's documented medication list, including drug-drug interaction screen and prescriptions for anti-emetics and supportive care specific to the treatment regimen The drug concentrations, fluid compatibility, administration routes, and timing of the medications to be used The patient's access for treatment and lifetime cumulative dose history, if applicable  The patient's medication allergies and previous infusion related reactions, if applicable   Changes made to treatment plan:  N/A  Follow up needed:  Cromberg, [place], 10/07/2022  8:56 AM

## 2022-10-08 LAB — ANA COMPREHENSIVE PANEL
Anti JO-1: <0.2 AI (ref 0.0–0.9)
Centromere Ab Screen: <0.2 AI (ref 0.0–0.9)
Chromatin Ab SerPl-aCnc: <0.2 AI (ref 0.0–0.9)
ENA SM Ab Ser-aCnc: <0.2 AI (ref 0.0–0.9)
Ribonucleic Protein: 7.5 AI — ABNORMAL HIGH (ref 0.0–0.9)
SSA (Ro) (ENA) Antibody, IgG: <0.2 AI (ref 0.0–0.9)
SSB (La) (ENA) Antibody, IgG: <0.2 AI (ref 0.0–0.9)
Scleroderma (Scl-70) (ENA) Antibody, IgG: <0.2 AI (ref 0.0–0.9)
ds DNA Ab: <1 IU/mL (ref 0–9)

## 2022-10-09 LAB — RHEUMATOID FACTOR: Rheumatoid fact SerPl-aCnc: 10.1 IU/mL (ref <14.0)

## 2022-10-10 ENCOUNTER — Telehealth: Payer: Self-pay | Admitting: *Deleted

## 2022-10-10 NOTE — Telephone Encounter (Signed)
Called pt to see how she did with her recent treatment.  She reports no problems at home after reaction in infusion room.  She knows how to reach [place] for concerns & knows her next appt.

## 2022-10-10 NOTE — Telephone Encounter (Signed)
-----   Message from Rafael Bihari, RN sent at 10/07/2022  4:33 PM EST ----- Regarding: Dr Candie Echevaria Pt, first time Rituxan Pt arrived for first time Rituxan infusion today. Had reaction (see notes in [facility]). Needs call back.

## 2022-10-11 ENCOUNTER — Ambulatory Visit: Payer: Medicare PPO

## 2022-10-11 ENCOUNTER — Encounter: Payer: Self-pay | Admitting: Physician Assistant

## 2022-10-11 ENCOUNTER — Other Ambulatory Visit: Payer: Medicare PPO

## 2022-10-11 NOTE — Progress Notes (Signed)
Returned call to patient from voicemail left.  Introduced myself as Arboriculturist and to offer available resources. Was discussing [employer], however patient does not have a ca diagnosis and therefore would not qualify.  Patient had questions regarding bill. Advised she would receive a bill after claim has been submitted to insurance and paid if she has a balance. Advised if there is any assistance available as far as treatment drug and out of pocket expense, a different department would reach out to her regarding assistance. She verbalized understanding.  She has my card for any additional financial questions or concerns.

## 2022-10-14 ENCOUNTER — Telehealth: Payer: Self-pay

## 2022-10-14 ENCOUNTER — Inpatient Hospital Stay (HOSPITAL_BASED_OUTPATIENT_CLINIC_OR_DEPARTMENT_OTHER): Payer: Medicare PPO | Admitting: Physician Assistant

## 2022-10-14 ENCOUNTER — Inpatient Hospital Stay: Payer: Medicare PPO

## 2022-10-14 ENCOUNTER — Other Ambulatory Visit: Payer: Self-pay

## 2022-10-14 VITALS — BP 125/49 | HR 83 | Temp 98.1°F | Resp 17

## 2022-10-14 VITALS — BP 122/46 | HR 80 | Temp 97.5°F | Resp 17 | Wt 257.3 lb

## 2022-10-14 DIAGNOSIS — D479 Neoplasm of uncertain behavior of lymphoid, hematopoietic and related tissue, unspecified: Secondary | ICD-10-CM

## 2022-10-14 DIAGNOSIS — R768 Other specified abnormal immunological findings in serum: Secondary | ICD-10-CM

## 2022-10-14 DIAGNOSIS — D500 Iron deficiency anemia secondary to blood loss (chronic): Secondary | ICD-10-CM

## 2022-10-14 DIAGNOSIS — Z5112 Encounter for antineoplastic immunotherapy: Secondary | ICD-10-CM | POA: Diagnosis not present

## 2022-10-14 LAB — CBC WITH DIFFERENTIAL (CANCER CENTER ONLY)
Abs Immature Granulocytes: 0.03 10*3/uL (ref 0.00–0.07)
Basophils Absolute: 0 10*3/uL (ref 0.0–0.1)
Basophils Relative: 1 %
Eosinophils Absolute: 0.3 10*3/uL (ref 0.0–0.5)
Eosinophils Relative: 4 %
HCT: 28.6 % — ABNORMAL LOW (ref 36.0–46.0)
Hemoglobin: 9.2 g/dL — ABNORMAL LOW (ref 12.0–15.0)
Immature Granulocytes: 1 %
Lymphocytes Relative: 26 %
Lymphs Abs: 1.7 10*3/uL (ref 0.7–4.0)
MCH: 30.3 pg (ref 26.0–34.0)
MCHC: 32.2 g/dL (ref 30.0–36.0)
MCV: 94.1 fL (ref 80.0–100.0)
Monocytes Absolute: 0.5 10*3/uL (ref 0.1–1.0)
Monocytes Relative: 7 %
Neutro Abs: 4 10*3/uL (ref 1.7–7.7)
Neutrophils Relative %: 61 %
Platelet Count: 270 10*3/uL (ref 150–400)
RBC: 3.04 MIL/uL — ABNORMAL LOW (ref 3.87–5.11)
RDW: 17.6 % — ABNORMAL HIGH (ref 11.5–15.5)
WBC Count: 6.5 10*3/uL (ref 4.0–10.5)
nRBC: 0 % (ref 0.0–0.2)

## 2022-10-14 LAB — RETIC PANEL
Immature Retic Fract: 18.2 % — ABNORMAL HIGH (ref 2.3–15.9)
RBC.: 3.01 MIL/uL — ABNORMAL LOW (ref 3.87–5.11)
Retic Count, Absolute: 174 10*3/uL (ref 19.0–186.0)
Retic Ct Pct: 5.8 % — ABNORMAL HIGH (ref 0.4–3.1)
Reticulocyte Hemoglobin: 29.7 pg (ref >27.9)

## 2022-10-14 LAB — CMP (CANCER CENTER ONLY)
ALT: 25 U/L (ref 0–44)
AST: 33 U/L (ref 15–41)
Albumin: 3.9 g/dL (ref 3.5–5.0)
Alkaline Phosphatase: 90 U/L (ref 38–126)
Anion gap: 3 — ABNORMAL LOW (ref 5–15)
BUN: 18 mg/dL (ref 8–23)
CO2: 28 mmol/L (ref 22–32)
Calcium: 8.7 mg/dL — ABNORMAL LOW (ref 8.9–10.3)
Chloride: 108 mmol/L (ref 98–111)
Creatinine: 0.75 mg/dL (ref 0.44–1.00)
GFR, Estimated: >60 mL/min (ref >60)
Glucose, Bld: 138 mg/dL — ABNORMAL HIGH (ref 70–99)
Potassium: 4.2 mmol/L (ref 3.5–5.1)
Sodium: 139 mmol/L (ref 135–145)
Total Bilirubin: 4.6 mg/dL (ref 0.3–1.2)
Total Protein: 6 g/dL — ABNORMAL LOW (ref 6.5–8.1)

## 2022-10-14 MED ORDER — DIPHENHYDRAMINE HCL 25 MG PO CAPS
50.0000 mg | ORAL_CAPSULE | Freq: Once | ORAL | Status: AC
  Administered 2022-10-14: 50 mg via ORAL
  Filled 2022-10-14: qty 2

## 2022-10-14 MED ORDER — SODIUM CHLORIDE 0.9 % IV SOLN
375.0000 mg/m2 | Freq: Once | INTRAVENOUS | Status: AC
  Administered 2022-10-14: 900 mg via INTRAVENOUS
  Filled 2022-10-14: qty 50

## 2022-10-14 MED ORDER — SODIUM CHLORIDE 0.9 % IV SOLN: Freq: Once | INTRAVENOUS | Status: AC

## 2022-10-14 MED ORDER — FAMOTIDINE IN NACL 20-0.9 MG/50ML-% IV SOLN
20.0000 mg | Freq: Once | INTRAVENOUS | Status: AC
  Administered 2022-10-14: 20 mg via INTRAVENOUS
  Filled 2022-10-14: qty 50

## 2022-10-14 MED ORDER — METHYLPREDNISOLONE SODIUM SUCC 125 MG IJ SOLR
62.5000 mg | Freq: Once | INTRAMUSCULAR | Status: AC
  Administered 2022-10-14: 62.5 mg via INTRAVENOUS
  Filled 2022-10-14: qty 2

## 2022-10-14 MED ORDER — ACETAMINOPHEN 325 MG PO TABS
650.0000 mg | ORAL_TABLET | Freq: Once | ORAL | Status: AC
  Administered 2022-10-14: 650 mg via ORAL
  Filled 2022-10-14: qty 2

## 2022-10-14 NOTE — Progress Notes (Signed)
Becky Becky Cox, Becky Becky Cox, 2023: WBC 7.2 hemoglobin 10.3 MCV 93 platelet count 311; 45 segs 44 lymphs 5 monos 5 eos Becky Cox basophil.  Specimen was prewarmed to 37 degrees to obtain results.  Technician suspected possibility of cold agglutinins/cryoglobulins present. Chemistries notable for creatinine 0.79 albumin 3.9.  AST ALT and alk phos normal. T. bili 2.3 direct bilirubin 0.6.  Reticulocyte count 2.8% September 09 2022:  [facility] Hematology Consult with Dr. Federico Becky Cox. WBC 9.6 hemoglobin 10.2 MCV 97 platelet count 346; 55 segs 35 lymphs 5 monos 4 eos Becky Cox basophil.  Reticulocyte count 3.3% Coombs test complement positive/IgG negative.  Haptoglobin undetectable.  Cold agglutinin titer greater than Becky Cox:4096 Hemoglobin electrophoresis normal adult pattern. SPEP with IEP showed no paraprotein.IgG 624 IgA 121 IgM 74. Serum free kappa 25.Becky Cox lambda 28.3 with a kappa lambda 0.89 B12 500 folate 27.3 ferritin 242 Copper 128 zinc 97 CMP notable for T. bili of 3.3 glucose 102 September 23, 2022: Underwent bone marrow biopsy and aspirate. 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 October 07, 2022: Started Rituximab weekly x 4 doses.  Developed infusion reaction with Cycle Becky Cox. Re-challenged with Cycle 2 but added premeds and increased infusion rate.   INTERVAL HISTORY: Becky Becky Cox returns for a follow up before Cycle 2 of weekly ritxumab. She developed an infusion reaction with Cycle Becky Cox. Her symptoms included chills, rigors, nausea and back pain. Emergency medications were given and symptoms resolved. Dr. Federico Becky Cox recommended to re-challenge today.   Becky Becky Cox reports that she is feeling well today. Her energy levels are stable. She still has some joint/muscle pain which  is unchanged from last visit. She denies nausea, vomiting or abdominal pain. Her bowel habits are unchanged without any recurrent episodes of diarrhea or constipation. She denies easy bruising or signs of active bleeding. She denies fevers, chills, night sweats, shortness of breath, chest pain or cough. She has no other complaints.    MEDICAL HISTORY: Past Medical History:  Diagnosis Date   Fibroid    Hypertension    Thyroid disease    Nodules    SURGICAL HISTORY: Past Surgical History:  Procedure Laterality Date   ABDOMINAL HYSTERECTOMY  1996   ANKLE SURGERY  04/2011   BONE SPURS IN RIGHT ANKLE   BREAST BIOPSY Left 2019   benign   BREAST BIOPSY Right 2021   FOOT SURGERY  2007 2008   KNEE SURGERY     Arthroscopic   MOLE REMOVAL  2009   BENIGN   THYROID CYST EXCISION     2001    SOCIAL HISTORY: Social History   Socioeconomic History   Marital status: Divorced    Spouse name: Not on file   Number of children: Not on file   Years of education: Not on file   Highest education level: Not on file  Occupational History   Not on file  Tobacco Use   Smoking status: Never   Smokeless tobacco: Never  Vaping Use   Vaping Use: Never used  Substance and Sexual Activity   Alcohol use: No    Alcohol/week: 0.0 standard drinks of alcohol   Drug use: No   Sexual activity: Not Currently    Comment: INTERCOURSE AGE UNKNOWN , SEXUAL PARTNERS MOE THAN 5  Other Topics Concern  Not on file  Social History Narrative   Not on file   Social Determinants of Health   Financial Resource Strain: Not on file  Food Insecurity: Not on file  Transportation Needs: Not on file  Physical Activity: Not on file  Stress: Not on file  Social Connections: Not on file  Intimate Partner Violence: Not on file    FAMILY HISTORY Family History  Problem Relation Age of Onset   Breast cancer Sister 76   Hypertension Sister    Cancer Sister        COLON   Stroke Brother    Hypertension  Brother    Breast cancer Maternal Grandmother     ALLERGIES:  is allergic to rituximab.  MEDICATIONS:  Current Outpatient Medications  Medication Sig Dispense Refill   allopurinol (ZYLOPRIM) 300 MG tablet Take Becky Cox tablet by mouth daily.     amLODipine (NORVASC) 5 MG tablet Take 10 tablets by mouth daily.      Ascorbic Acid (VITAMIN C PO) Take by mouth.     atorvastatin (LIPITOR) 10 MG tablet Take Becky Cox tablet by mouth daily.     diclofenac Sodium (VOLTAREN) Becky Cox % GEL Apply topically.     fluticasone (FLONASE) 50 MCG/ACT nasal spray Place 2 sprays into the nose daily.     folic acid (FOLVITE) Becky Cox MG tablet Take Becky Cox mg by mouth daily.     ipratropium (ATROVENT) 0.06 % nasal spray Place 2 sprays into both nostrils 4 (four) times daily. 15 mL 2   levothyroxine (SYNTHROID) 100 MCG tablet Take 100 mcg by mouth daily.     methotrexate (RHEUMATREX) 2.5 MG tablet take 6 tablets     montelukast (SINGULAIR) 10 MG tablet Take Becky Cox tablet by mouth at bedtime.     Multiple Vitamin (MULTIVITAMIN PO) Take Becky Cox tablet by mouth daily.     triamterene-hydrochlorothiazide (MAXZIDE-25) 37.5-25 MG tablet Take Becky Cox tablet by mouth daily.     No current facility-administered medications for this visit.   REVIEW OF SYSTEMS:   Constitutional: Negative for appetite change, chills, fever and unexpected weight change. +fatigue HENT: Negative for mouth sores, nosebleeds, sore throat and trouble swallowing.   Eyes: Negative for eye problems Respiratory: Negative for cough, hemoptysis, shortness of breath and wheezing.   Cardiovascular: Negative for chest pain and leg swelling.  Gastrointestinal: Negative for abdominal pain, constipation, diarrhea, nausea and vomiting.  Genitourinary: Negative for bladder incontinence, difficulty urinating, dysuria, frequency and hematuria.   Skin:Negative for rash and ulcers Neurological: Negative for dizziness, extremity weakness, gait problem, headaches, light-headedness and seizures.   Hematological: Negative for adenopathy. Does not bruise/bleed easily.  Psychiatric/Behavioral: Negative for confusion, depression and sleep disturbance. The patient is not nervous/anxious.     PHYSICAL EXAMINATION:  ECOG PERFORMANCE STATUS: Becky Cox - Symptomatic but completely ambulatory   Vitals:   10/14/22 1139  BP: (!) 122/46  Pulse: 80  Resp: 17  Temp: (!) 97.5 F (36.4 C)  SpO2: 96%     Filed Weights   10/14/22 1139  Weight: 257 lb 5 oz (116.7 kg)    Constitutional: Oriented to person, place, and time and well-developed, well-nourished, and in no distress.  HENT:  Head: Normocephalic and atraumatic.  Eyes: Conjunctivae are normal. Right eye exhibits no discharge. Left eye exhibits no discharge. +Scleral icterus.  Neck: Normal range of motion. Neck supple.   Cardiovascular: Normal rate, regular rhythm. +Heart murmur appreciated.   Pulmonary/Chest: Effort normal and breath sounds normal. No respiratory distress. No wheezes. No rales.  Musculoskeletal: Normal range of motion. +Bilateral lower extremity edema, wearing compression stockings.  Lymphadenopathy: No cervical adenopathy.  Neurological: Alert and oriented to person, place, and time. Exhibits normal muscle tone. Skin: Skin is warm and dry. No rash noted. Not diaphoretic. No erythema. No pallor.  Psychiatric: Mood, memory and judgment normal.    LABORATORY DATA: I have personally reviewed the data as listed:  Orders Only on 10/14/2022  Component Date Value Ref Range Status   Retic Ct Pct 10/14/2022 5.8 (H)  0.4 - 3.Becky Cox % Final   RBC. 10/14/2022 3.01 (L)  3.87 - 5.11 MIL/uL Final   Retic Count, Absolute 10/14/2022 174.0  19.0 - 186.0 K/uL Final   Immature Retic Fract 10/14/2022 18.2 (H)  2.3 - 15.9 % Final   Reticulocyte Hemoglobin 10/14/2022 29.7  >27.9 pg Final   Performed at [facility], [place] [address] [place], [place] [address]  Appointment on 10/14/2022  Component Date Value Ref  Range Status   Sodium 10/14/2022 139  135 - 145 mmol/L Final   Potassium 10/14/2022 4.2  3.5 - 5.Becky Cox mmol/L Final   Chloride 10/14/2022 108  98 - 111 mmol/L Final   CO2 10/14/2022 28  22 - 32 mmol/L Final   Glucose, Bld 10/14/2022 138 (H)  70 - 99 mg/dL Final   Glucose reference range applies only to samples taken after fasting for at least 8 hours.   BUN 10/14/2022 18  8 - 23 mg/dL Final   Creatinine 10/14/2022 0.75  0.44 - Becky Cox.00 mg/dL Final   Calcium 10/14/2022 8.7 (L)  8.9 - 10.3 mg/dL Final   Total Protein 10/14/2022 6.0 (L)  6.5 - 8.Becky Cox g/dL Final   Albumin 10/14/2022 3.9  3.5 - 5.0 g/dL Final   AST 10/14/2022 33  15 - 41 U/L Final   ALT 10/14/2022 25  0 - 44 U/L Final   Alkaline Phosphatase 10/14/2022 90  38 - 126 U/L Final   Total Bilirubin 10/14/2022 4.6 (HH)  0.3 - Becky Cox.2 mg/dL Final   CRITICAL RESULT CALLED TO, READ BACK BY AND VERIFIED WITH: MEGAN HODGE, RN AT 1151 BY PAM SUTCAVAGE, MT    GFR, Estimated 10/14/2022 >60  >60 mL/min Final   Comment: (NOTE) Calculated using the CKD-EPI Creatinine Equation (2021)    Anion gap 10/14/2022 3 (L)  5 - 15 Final   Performed at [facility], [place] [address] [place], [place] [address]   WBC Count 10/14/2022 6.5  4.0 - 10.5 K/uL Final   RBC 10/14/2022 3.04 (L)  3.87 - 5.11 MIL/uL Final   Hemoglobin 10/14/2022 9.2 (L)  12.0 - 15.0 g/dL Final   HCT 10/14/2022 28.6 (L)  36.0 - 46.0 % Final   MCV 10/14/2022 94.Becky Cox  80.0 - 100.0 fL Final   MCH 10/14/2022 30.3  26.0 - 34.0 pg Final   MCHC 10/14/2022 32.2  30.0 - 36.0 g/dL Final   RDW 10/14/2022 17.6 (H)  11.5 - 15.5 % Final   Platelet Count 10/14/2022 270  150 - 400 K/uL Final   nRBC 10/14/2022 0.0  0.0 - 0.2 % Final   Neutrophils Relative % 10/14/2022 61  % Final   Neutro Abs 10/14/2022 4.0  Becky Cox.7 - 7.7 K/uL Final   Lymphocytes Relative 10/14/2022 26  % Final   Lymphs Abs 10/14/2022 Becky Cox.7  0.7 - 4.0 K/uL Final   Monocytes Relative 10/14/2022 7  % Final   Monocytes Absolute  10/14/2022 0.5  0.Becky Cox - Becky Cox.0 K/uL  Final   Eosinophils Relative 10/14/2022 4  % Final   Eosinophils Absolute 10/14/2022 0.3  0.0 - 0.5 K/uL Final   Basophils Relative 10/14/2022 Becky Cox  % Final   Basophils Absolute 10/14/2022 0.0  0.0 - 0.Becky Cox K/uL Final   Immature Granulocytes 10/14/2022 Becky Cox  % Final   Abs Immature Granulocytes 10/14/2022 0.03  0.00 - 0.07 K/uL Final   Performed at [facility], [place] [address], [place], [place] [address]  Appointment on 10/07/2022  Component Date Value Ref Range Status   Hep B Core Total Ab 10/07/2022 NON REACTIVE  NON REACTIVE Final   Performed at [facility], [place] [address], [place], [place] [address]   Hepatitis B Surface Ag 10/07/2022 NON REACTIVE  NON REACTIVE Final   Performed at [facility], [place] [address] [place], [place] [address]   Rhuematoid fact SerPl-aCnc 10/07/2022 10.Becky Cox  <14.0 IU/mL Final   Comment: (NOTE) Performed At: [facility] [place], [place] [id number] Rush Farmer Becky SJ:[id number]    ds DNA Ab 10/07/2022 <Becky Cox  0 - 9 IU/mL Final   Comment: (NOTE)                                   Negative      <5                                   Equivocal  5 - 9                                   Positive      >9    Ribonucleic Protein 10/07/2022 7.5 (H)  0.0 - 0.9 AI Final   ENA SM Ab Ser-aCnc 10/07/2022 <0.2  0.0 - 0.9 AI Final   Scleroderma (Scl-70) (ENA) Antibod* 10/07/2022 <0.2  0.0 - 0.9 AI Final   SSA (Ro) (ENA) Antibody, IgG 10/07/2022 <0.2  0.0 - 0.9 AI Final   SSB (Becky) (ENA) Antibody, IgG 10/07/2022 <0.2  0.0 - 0.9 AI Final   Chromatin Ab SerPl-aCnc 10/07/2022 <0.2  0.0 - 0.9 AI Final   Anti JO-Becky Cox 10/07/2022 <0.2  0.0 - 0.9 AI Final   Centromere Ab Screen 10/07/2022 <0.2  0.0 - 0.9 AI Final   See below: 10/07/2022 Comment   Final   Comment: (NOTE) Autoantibody                       Disease Association ------------------------------------------------------------                         Condition                  Frequency ---------------------   ------------------------   --------- Antinuclear Antibody,    SLE, mixed connective Direct (ANA-D)           tissue diseases ---------------------   ------------------------   --------- dsDNA                    SLE                        40 - 60% ---------------------   ------------------------   ---------  Chromatin                Drug induced SLE                90%                         SLE                        48 - 97% ---------------------   ------------------------   --------- SSA (Ro)                 SLE                        25 - 35%                         Sjogren's Syndrome         40 - 70%                         Neonatal Lupus                 100% ---------------------   ------------------------   --------- SSB (Becky)                 SLE                                                       10%                         Sjogren's Syndrome              30% ---------------------   -----------------------    --------- Sm (anti-Smith)          SLE                        15 - 30% ---------------------   -----------------------    --------- RNP                      Mixed Connective Tissue                         Disease                         95% (U1 nRNP,                SLE                        30 - 50% anti-ribonucleoprotein)  Polymyositis and/or                         Dermatomyositis                 20% ---------------------   ------------------------   --------- Scl-70 (antiDNA          Scleroderma (diffuse)      20 - 35% topoisomerase)           Crest  13% ---------------------   ------------------------   --------- Jo-Becky Cox                     Polymyositis and/or                         Dermatomyositis            20 - 40% ---------------------   ------------------------   --------- Centromere B             Scleroderma -                           Crest                         variant                          80% Performed At: [facility] [place], [place] [id number] Rush Farmer Becky HA:[id number]   Clinical Support on 09/23/2022  Component Date Value Ref Range Status   WBC Count 09/23/2022 6.6  4.0 - 10.5 K/uL Final   RBC 09/23/2022 2.70 (L)  3.87 - 5.11 MIL/uL Final   Hemoglobin 09/23/2022 8.8 (L)  12.0 - 15.0 g/dL Final   HCT 09/23/2022 25.7 (L)  36.0 - 46.0 % Final   MCV 09/23/2022 95.2  80.0 - 100.0 fL Final   MCH 09/23/2022 32.6  26.0 - 34.0 pg Final   MCHC 09/23/2022 34.2  30.0 - 36.0 g/dL Final   RDW 09/23/2022 17.6 (H)  11.5 - 15.5 % Final   Platelet Count 09/23/2022 306  150 - 400 K/uL Final   nRBC 09/23/2022 0.3 (H)  0.0 - 0.2 % Final   Neutrophils Relative % 09/23/2022 49  % Final   Neutro Abs 09/23/2022 3.3  Becky Cox.7 - 7.7 K/uL Final   Lymphocytes Relative 09/23/2022 35  % Final   Lymphs Abs 09/23/2022 2.3  0.7 - 4.0 K/uL Final   Monocytes Relative 09/23/2022 8  % Final   Monocytes Absolute 09/23/2022 0.5  0.Becky Cox - Becky Cox.0 K/uL Final   Eosinophils Relative 09/23/2022 6  % Final   Eosinophils Absolute 09/23/2022 0.4  0.0 - 0.5 K/uL Final   Basophils Relative 09/23/2022 Becky Cox  % Final   Basophils Absolute 09/23/2022 0.Becky Cox  0.0 - 0.Becky Cox K/uL Final   Immature Granulocytes 09/23/2022 Becky Cox  % Final   Abs Immature Granulocytes 09/23/2022 0.03  0.00 - 0.07 K/uL Final   Performed at [facility], [place] [address] [place], [place] [address]   SURGICAL PATHOLOGY 09/23/2022    Final-Edited                   Value:Surgical Pathology CASE: [id number] PATIENT: Aliese Radcliffe Flow Pathology Report     Clinical history: iron deficiency  anemia due to chronic blood loss, Cold agglutinin     DIAGNOSIS:  - Minor B-cell population with kappa excess (see Comment)  COMMENT:   A minor CD10 positive B-cell population(Becky Cox% of the lymphocytes) with kappa excess is noted. Please correlate with clinical findings  of lymphadenopathy/hepatosplenomegaly; peripheral blood and bone marrow findings ([id number]).  GATING AND PHENOTYPIC ANALYSIS:  Gated population: Flow cytometric immunophenotyping is performed using antibodies to the antigens listed in the table below. Electronic gates are placed around a cell cluster displaying light scatter properties corresponding to: lymphocytes  Abnormal Cells in gated population: N/A  Phenotype of Abnormal Cells: N/A                        Lymphoid Antigens       Myeloid Antigens Miscellaneous CD2  tested    CD10 tested    CD11b     ND                            CD45 tested CD3  tested    CD19 tested    CD11c     ND   HLA-Dr    ND CD4  tested    CD20 tested    CD13 ND   CD34 tested CD5  tested    CD22 ND   CD14 ND   CD38 tested CD7  tested    CD79b     ND   CD15 ND   CD138     ND CD8  tested    CD103     ND   CD16 ND   TdT  ND CD25 ND   CD200     tested    CD33 ND   CD123     ND TCRab     ND   sKappa    tested    CD64 ND   CD41 ND TCRgd     tested    sLambda   tested    CD117     ND   CD61 ND CD56 tested    cKappa    ND   MPO  ND   CD71 ND CD57 ND   cLambda   ND        [id number]      GROSS DESCRIPTION:  Reference Bone Marrow case [id number].    Final Diagnosis performed by Tilford Pillar, Becky.   Electronically signed 09/30/2022 Technical and / or Professional components performed at [facility], [place] [address] [place], [place] [address].  The above tests were developed and their performance characteristics determined by the [facility] system for the physical and immunophenotypic characterization of                          cell populations. They have not been cleared by the U.S. Food and Drug administration. The  FDA has determined that such clearance or approval is not necessary. This test is used for clinical purposes. It should not be  regarded as investigational or for research   Office Visit on 09/23/2022   Component Date Value Ref Range Status   SURGICAL PATHOLOGY 09/23/2022    Final-Edited                   Value:Surgical Pathology CASE: [id number] PATIENT: Ailis Schoenherr Bone Marrow Report     Clinical History: iron deficiency  anemia due to chronic blood loss, Cold agglutinin (BH)     DIAGNOSIS:  BONE MARROW, ASPIRATE, CLOT, CORE: - Hypercellular bone marrow (60%) with mild dyspoiesis and trilineage hematopoiesis (see comment)  PERIPHERAL BLOOD: -Microcytic hypochromic anemia, agglutination  COMMENT:   Morphologic assessment reveals a hypercellular bone marrow with trilineage hematopoiesis.  While mild dyspoiesis is observed, it could at least partially be attributed to the patient's history of autoimmune disease and subsequent treatment; along with thyroid abnormalities. Importantly blasts are not increased.  There are no lymphoid aggregates or increased B lymphocytes noted on the bone  marrow, however flow cytometry did reveal a minor B-cell population (Becky Cox% of the lymphocytes) showing a kappa excess and therefore a very low-level involvement by a lymphopro                         liferative disorder cannot be entirely excluded.  Correlation with the presence of lymphadenopathy, and/ or organomegaly is required. Plasma cells are not increased and are polyclonal by kappa/lambda in situ hybridization. A MDS NGS panel is pending and may provide further clarification.  MICROSCOPIC DESCRIPTION:  PERIPHERAL BLOOD SMEAR: Platelets: Adequate in number, no platelet clumps and fibrin strands appreciated Erythroid: Microcytic hypochromic, anisopoikilocytosis, agglutination Leukocytes: Adequate, negative for dysplastic granulocytes, blasts, lymphocytes or plasma cells  BONE MARROW ASPIRATE: Cellular Erythroid precursors: Erythroid precursors exhibit full sequence of generally orderly maturation with occasional dyspoietic forms Granulocytic precursors: Granulocytic  precursors exhibit full sequence of generally orderly maturation with occasional hypogranular/hypolobated neutrophils Megakaryocytes: Typical in number and morphology with occasional abnormal                         ly lobated forms Lymphocytes/plasma cells: Not increased  TOUCH PREPARATIONS: Confirmatory of the aspirate findings  CLOT AND BIOPSY: The clot and biopsy reveal hypercellular marrow with trilineage hematopoiesis.  Blasts are not increased.  The findings are confirmatory of the aspirate and touch prep impression. Special stains: CD34: CD34 highlights vasculature and blasts, less than 5% of cellularity CD117: CD117 highlights immature mononuclear cells, including myeloid erythroid and mast cells MPO: MPO highlights myeloid cells E-cadherin: E-cadherin highlights erythroid precursors CD138: CD138 highlights plasma cells on clot and core section, approximately <5% of cellularity Kappa/lambda ISH: Kappa/lambda ISH are polyclonal CD3: CD3 highlights T lymphocytes CD20: CD20 highlights B lymphocytes (less than CD3)  IRON STAIN: Iron stains are performed on a bone marrow aspirate or touch imprint smear and section of clot. The controls stained appropriately.       Stora                         ge Iron: Scant      Ring Sideroblasts: Not identified  ADDITIONAL DATA/TESTING: MDS panel  CELL COUNT DATA:  Bone Marrow count performed on 500 cells shows: Blasts:   0%   Myeloid:  66% Promyelocytes: 0%   Erythroid:     27% Myelocytes:    9%   Lymphocytes:   5% Metamyelocytes:     4%   Plasma cells:  Becky Cox% Bands:    7% Neutrophils:   37%  M:E ratio:     2.44 Eosinophils:   9% Basophils:     0% Monocytes:     Becky Cox%  Lab Data: CBC performed on 09/23/2022 shows: WBC: 6.6 k/uL  Neutrophils:   61% Hgb: 8.8 g/dL  Lymphocytes:   30% HCT: 25.7 %    Monocytes:     5% MCV: 95.2 fL   Eosinophils:   4% RDW: 17.6 %    Basophils:     0% PLT: 306 k/uL    GROSS DESCRIPTION:  A:  Aspirate smear  B: The specimen is received in B-plus fixative and consists of a 33.0 x 18.0 x 8.0 mm aggregate of red-brown clotted blood.  The specimen is entirely submitted in Becky Cox cassette.  C: The specimen is received in B-plus fixative and consists of a Becky Cox.5 x 0.5 x 0.3 cm aggregate of red-brow  n bone and clotted blood.  The specimen is entirely submitted in Becky Cox cassette following decalcification with Immunocal.  Craig Staggers 09/23/2022)   Final Diagnosis performed by Tilford Pillar, Becky.   Electronically signed 09/30/2022 Technical and / or Professional components performed at [facility], [place] [address] [place], [place] [address].  Immunohistochemistry Technical component (if applicable) was performed at [facility]. [address] [place], [place], [place] [address].   IMMUNOHISTOCHEMISTRY DISCLAIMER (if applicable): Some of these immunohistochemical stains may have been developed and the performance characteristics determine by [facility]. Some may not have been cleared or approved by the U.S. Food and Drug Administration. The FDA has determined that such clearance or approval is not necessary. This test is used for clinical purposes. It should not be regarded as investigational or for research. This laboratory is                          certified under the [employer] (CLIA-88) as qualified to perform high complexity clinical laboratory testing.  The controls stained appropriately.     RADIOGRAPHIC STUDIES: I have personally reviewed the radiological images as listed and agree with the findings in the report  No results found.  ASSESSMENT Taleya Missi Mcmackin is a 71 y.o. 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  -Labs notable for Coombs  (IgG negative/ Complement positive), Haptoglobin < 10, Cold agglutinin titer greater than Becky Cox:4096, Bilirubin 3.3, Smear showed RBC clumping, Hgb 10.2 -Bone marrow bx showed expanded erythron and  monoclonal B cell population (Becky Cox%) with kappa restriction. -Recommended Rituxan 375 mg/m2 IV weekly x 4. -Discussed importance of avoiding cold as this can exacerbate hemolysis.   -Currently on folic acid Becky Cox mg daily to support RBC production and allopurinol due to increased uric acid  PLAN: --Due for Cycle 2 of weekly Ritxumab.  --Due to infusion reaction with Cycle Becky Cox, we will recommend to rechallenge with additional premeds (pepcid and solumedrol) and continue with extended infusion rate similar to Cycle Becky Cox.  --Labs from today were reviewed which showed Hgb 9.2, MCV 89.Becky Cox, Tbili 4.6, retic 5.8%.  --Proceed with treatment today as planned with the above modifications --RTC next week for Cycle 3 of weekly Rituximab and 2 weeks for follow up visit with Dr. Federico Becky Cox prior to Cycle 4.    All questions were answered. The patient knows to call the clinic with any problems, questions or concerns.  I have spent a total of 30 minutes minutes of face-to-face and non-face-to-face time, preparing to see the patient performing a medically appropriate examination, counseling and educating the patient, ordering medications/tests/procedures,  documenting clinical information in the electronic health record, and care coordination.   Dede Query PA-C Dept of Hematology and [place] at [facility] Phone: [phone]

## 2022-10-14 NOTE — Telephone Encounter (Signed)
CRITICAL VALUE STICKER  CRITICAL VALUE: t. Billi 4.6  RECEIVER (on-site recipient of call): Winola Drum, [place] NOTIFIED: 11/17. 1150  MESSENGER (representative from lab): pam  MD NOTIFIED: Dede Query, PA  TIME OF NOTIFICATION:1152  RESPONSE:   PA aware.

## 2022-10-14 NOTE — Patient Instructions (Signed)
[place] ONCOLOGY  Discharge Instructions: Thank you for choosing [facility] to provide your oncology and hematology care.   If you have a lab appointment with the [facility], please go directly to the [facility] and check in at the registration area.   Wear comfortable clothing and clothing appropriate for easy access to any Portacath or PICC line.   We strive to give you quality time with your provider. You may need to reschedule your appointment if you arrive late (15 or more minutes).  Arriving late affects you and other patients whose appointments are after yours.  Also, if you miss three or more appointments without notifying the office, you may be dismissed from the clinic at the provider's discretion.      For prescription refill requests, have your pharmacy contact our office and allow 72 hours for refills to be completed.    Today you received the following chemotherapy and/or immunotherapy agents rituxan      To help prevent nausea and vomiting after your treatment, we encourage you to take your nausea medication as directed.  BELOW ARE SYMPTOMS THAT SHOULD BE REPORTED IMMEDIATELY: *FEVER GREATER THAN 100.4 F (38 C) OR HIGHER *CHILLS OR SWEATING *NAUSEA AND VOMITING THAT IS NOT CONTROLLED WITH YOUR NAUSEA MEDICATION *UNUSUAL SHORTNESS OF BREATH *UNUSUAL BRUISING OR BLEEDING *URINARY PROBLEMS (pain or burning when urinating, or frequent urination) *BOWEL PROBLEMS (unusual diarrhea, constipation, pain near the anus) TENDERNESS IN MOUTH AND THROAT WITH OR WITHOUT PRESENCE OF ULCERS (sore throat, sores in mouth, or a toothache) UNUSUAL RASH, SWELLING OR PAIN  UNUSUAL VAGINAL DISCHARGE OR ITCHING   Items with * indicate a potential emergency and should be followed up as soon as possible or go to the Emergency Department if any problems should occur.  Please show the CHEMOTHERAPY ALERT CARD or IMMUNOTHERAPY ALERT CARD at check-in to the  Emergency Department and triage nurse.  Should you have questions after your visit or need to cancel or reschedule your appointment, please contact [facility]  Dept: [phone]  and follow the prompts.  Office hours are 8:00 a.m. to 4:30 p.m. Monday - Friday. Please note that voicemails left after 4:00 p.m. may not be returned until the following business day.  We are closed weekends and major holidays. You have access to a nurse at all times for urgent questions. Please call the main number to the clinic Dept: [phone] and follow the prompts.   For any non-urgent questions, you may also contact your provider using MyChart. We now offer e-Visits for anyone 20 and older to request care online for non-urgent symptoms. For details visit [email or web address].   Also download the MyChart app! Go to the app store, search "MyChart", open the app, select [facility], and log in with your MyChart username and password.  Masks are optional in the cancer centers. If you would like for your care team to wear a mask while they are taking care of you, please let them know. You may have one support person who is at least 70 years old accompany you for your appointments.

## 2022-10-18 ENCOUNTER — Ambulatory Visit: Payer: Medicare PPO

## 2022-10-18 ENCOUNTER — Other Ambulatory Visit: Payer: Medicare PPO

## 2022-10-19 ENCOUNTER — Encounter (HOSPITAL_COMMUNITY): Payer: Self-pay | Admitting: Oncology

## 2022-10-19 ENCOUNTER — Other Ambulatory Visit: Payer: Self-pay

## 2022-10-19 DIAGNOSIS — R768 Other specified abnormal immunological findings in serum: Secondary | ICD-10-CM

## 2022-10-19 DIAGNOSIS — D479 Neoplasm of uncertain behavior of lymphoid, hematopoietic and related tissue, unspecified: Secondary | ICD-10-CM

## 2022-10-21 ENCOUNTER — Other Ambulatory Visit: Payer: Self-pay

## 2022-10-21 ENCOUNTER — Other Ambulatory Visit: Payer: Self-pay | Admitting: Hematology and Oncology

## 2022-10-21 ENCOUNTER — Inpatient Hospital Stay: Payer: Medicare PPO

## 2022-10-21 VITALS — BP 121/56 | HR 74 | Temp 97.9°F | Resp 18

## 2022-10-21 DIAGNOSIS — D479 Neoplasm of uncertain behavior of lymphoid, hematopoietic and related tissue, unspecified: Secondary | ICD-10-CM

## 2022-10-21 DIAGNOSIS — Z5112 Encounter for antineoplastic immunotherapy: Secondary | ICD-10-CM | POA: Diagnosis not present

## 2022-10-21 DIAGNOSIS — R768 Other specified abnormal immunological findings in serum: Secondary | ICD-10-CM

## 2022-10-21 LAB — CMP (CANCER CENTER ONLY)
ALT: 27 U/L (ref 0–44)
AST: 25 U/L (ref 15–41)
Albumin: 4 g/dL (ref 3.5–5.0)
Alkaline Phosphatase: 97 U/L (ref 38–126)
Anion gap: 6 (ref 5–15)
BUN: 20 mg/dL (ref 8–23)
CO2: 27 mmol/L (ref 22–32)
Calcium: 10.5 mg/dL — ABNORMAL HIGH (ref 8.9–10.3)
Chloride: 107 mmol/L (ref 98–111)
Creatinine: 0.86 mg/dL (ref 0.44–1.00)
GFR, Estimated: >60 mL/min (ref >60)
Glucose, Bld: 127 mg/dL — ABNORMAL HIGH (ref 70–99)
Potassium: 3.6 mmol/L (ref 3.5–5.1)
Sodium: 140 mmol/L (ref 135–145)
Total Bilirubin: 2.6 mg/dL — ABNORMAL HIGH (ref 0.3–1.2)
Total Protein: 6 g/dL — ABNORMAL LOW (ref 6.5–8.1)

## 2022-10-21 LAB — CBC WITH DIFFERENTIAL (CANCER CENTER ONLY)
Abs Immature Granulocytes: 0.03 10*3/uL (ref 0.00–0.07)
Basophils Absolute: 0 10*3/uL (ref 0.0–0.1)
Basophils Relative: 1 %
Eosinophils Absolute: 0.4 10*3/uL (ref 0.0–0.5)
Eosinophils Relative: 6 %
HCT: 30.9 % — ABNORMAL LOW (ref 36.0–46.0)
Hemoglobin: 10.1 g/dL — ABNORMAL LOW (ref 12.0–15.0)
Immature Granulocytes: 0 %
Lymphocytes Relative: 25 %
Lymphs Abs: 1.8 10*3/uL (ref 0.7–4.0)
MCH: 31 pg (ref 26.0–34.0)
MCHC: 32.7 g/dL (ref 30.0–36.0)
MCV: 94.8 fL (ref 80.0–100.0)
Monocytes Absolute: 0.5 10*3/uL (ref 0.1–1.0)
Monocytes Relative: 7 %
Neutro Abs: 4.5 10*3/uL (ref 1.7–7.7)
Neutrophils Relative %: 61 %
Platelet Count: 291 10*3/uL (ref 150–400)
RBC: 3.26 MIL/uL — ABNORMAL LOW (ref 3.87–5.11)
RDW: 17.1 % — ABNORMAL HIGH (ref 11.5–15.5)
WBC Count: 7.2 10*3/uL (ref 4.0–10.5)
nRBC: 0 % (ref 0.0–0.2)

## 2022-10-21 LAB — RETIC PANEL
Immature Retic Fract: 13.8 % (ref 2.3–15.9)
RBC.: 3.32 MIL/uL — ABNORMAL LOW (ref 3.87–5.11)
Retic Count, Absolute: 142.1 10*3/uL (ref 19.0–186.0)
Retic Ct Pct: 4.3 % — ABNORMAL HIGH (ref 0.4–3.1)
Reticulocyte Hemoglobin: 30.8 pg (ref >27.9)

## 2022-10-21 MED ORDER — METHYLPREDNISOLONE SODIUM SUCC 125 MG IJ SOLR
62.5000 mg | Freq: Once | INTRAMUSCULAR | Status: AC
  Administered 2022-10-21: 62.5 mg via INTRAVENOUS
  Filled 2022-10-21: qty 2

## 2022-10-21 MED ORDER — DIPHENHYDRAMINE HCL 25 MG PO CAPS
50.0000 mg | ORAL_CAPSULE | Freq: Once | ORAL | Status: AC
  Administered 2022-10-21: 50 mg via ORAL
  Filled 2022-10-21: qty 2

## 2022-10-21 MED ORDER — SODIUM CHLORIDE 0.9 % IV SOLN: Freq: Once | INTRAVENOUS | Status: AC

## 2022-10-21 MED ORDER — ACETAMINOPHEN 325 MG PO TABS
650.0000 mg | ORAL_TABLET | Freq: Once | ORAL | Status: AC
  Administered 2022-10-21: 650 mg via ORAL
  Filled 2022-10-21: qty 2

## 2022-10-21 MED ORDER — FAMOTIDINE IN NACL 20-0.9 MG/50ML-% IV SOLN
20.0000 mg | Freq: Once | INTRAVENOUS | Status: AC
  Administered 2022-10-21: 20 mg via INTRAVENOUS
  Filled 2022-10-21: qty 50

## 2022-10-21 MED ORDER — SODIUM CHLORIDE 0.9 % IV SOLN
375.0000 mg/m2 | Freq: Once | INTRAVENOUS | Status: AC
  Administered 2022-10-21: 900 mg via INTRAVENOUS
  Filled 2022-10-21: qty 50

## 2022-10-21 NOTE — Patient Instructions (Signed)
[place] ONCOLOGY  Discharge Instructions: Thank you for choosing [facility] to provide your oncology and hematology care.   If you have a lab appointment with the [facility], please go directly to the [facility] and check in at the registration area.   Wear comfortable clothing and clothing appropriate for easy access to any Portacath or PICC line.   We strive to give you quality time with your provider. You may need to reschedule your appointment if you arrive late (15 or more minutes).  Arriving late affects you and other patients whose appointments are after yours.  Also, if you miss three or more appointments without notifying the office, you may be dismissed from the clinic at the provider's discretion.      For prescription refill requests, have your pharmacy contact our office and allow 72 hours for refills to be completed.    Today you received the following chemotherapy and/or immunotherapy agents: rituximab      To help prevent nausea and vomiting after your treatment, we encourage you to take your nausea medication as directed.  BELOW ARE SYMPTOMS THAT SHOULD BE REPORTED IMMEDIATELY: *FEVER GREATER THAN 100.4 F (38 C) OR HIGHER *CHILLS OR SWEATING *NAUSEA AND VOMITING THAT IS NOT CONTROLLED WITH YOUR NAUSEA MEDICATION *UNUSUAL SHORTNESS OF BREATH *UNUSUAL BRUISING OR BLEEDING *URINARY PROBLEMS (pain or burning when urinating, or frequent urination) *BOWEL PROBLEMS (unusual diarrhea, constipation, pain near the anus) TENDERNESS IN MOUTH AND THROAT WITH OR WITHOUT PRESENCE OF ULCERS (sore throat, sores in mouth, or a toothache) UNUSUAL RASH, SWELLING OR PAIN  UNUSUAL VAGINAL DISCHARGE OR ITCHING   Items with * indicate a potential emergency and should be followed up as soon as possible or go to the Emergency Department if any problems should occur.  Please show the CHEMOTHERAPY ALERT CARD or IMMUNOTHERAPY ALERT CARD at check-in to  the Emergency Department and triage nurse.  Should you have questions after your visit or need to cancel or reschedule your appointment, please contact [facility]  Dept: [phone]  and follow the prompts.  Office hours are 8:00 a.m. to 4:30 p.m. Monday - Friday. Please note that voicemails left after 4:00 p.m. may not be returned until the following business day.  We are closed weekends and major holidays. You have access to a nurse at all times for urgent questions. Please call the main number to the clinic Dept: [phone] and follow the prompts.   For any non-urgent questions, you may also contact your provider using MyChart. We now offer e-Visits for anyone 57 and older to request care online for non-urgent symptoms. For details visit [email or web address].   Also download the MyChart app! Go to the app store, search "MyChart", open the app, select [facility], and log in with your MyChart username and password.  Masks are optional in the cancer centers. If you would like for your care team to wear a mask while they are taking care of you, please let them know. You may have one support person who is at least 70 years old accompany you for your appointments.

## 2022-10-25 ENCOUNTER — Ambulatory Visit: Payer: Medicare PPO

## 2022-10-25 ENCOUNTER — Other Ambulatory Visit: Payer: Medicare PPO

## 2022-10-27 NOTE — Progress Notes (Signed)
[facility] Initial Visit:  Patient Care Team: Audley Hose, MD as PCP - General (Internal Medicine) Barbee Cough, MD as Attending Physician (Hematology)  CHIEF COMPLAINTS/PURPOSE OF CONSULTATION:  Oncology History   No history exists.    HISTORY OF PRESENTING ILLNESS: Becky Cox 71 y.o. female is here because of anemia Medical history notable for thyroid nodule, hypothyroidism, morbid obesity, obstructive sleep apnea, bilateral hearing loss, hypertension, rheumatoid arthritis, chronic diarrhea, hypertension, gout  March 22 2022:  Colonoscopy.  3 very small polyps  July 29, 2022: WBC 7.2 hemoglobin 10.3 MCV 93 platelet count 311; 45 segs 44 lymphs 5 monos 5 eos 1 basophil.  Specimen was prewarmed to 37 degrees to obtain results.  Technician suspected possibility of cold agglutinins/cryoglobulins present Chemistries notable for creatinine 0.79 albumin 3.9.  AST ALT and alk phos normal T. bili 2.3 direct bilirubin 0.6.  Reticulocyte count 2.8%  September 09 2022:  [facility] Hematology Consult Was told years ago that she was anemic.   Has never taken oral iron.  No history of IV iron or PRBC's.   Does not take ASA.  Mainly takes Tylenol for pain; rarely ibuprofen No pica to ice.  Uses CPAP every night  Social:  Divorced.  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  Tobacco none.  EtOH  [facility] Mother died 35 MI Father died 63 pneumonia Sister died 62 CVA, MS Sister alive 68 colon cancer Sister alive 30 Parkinson's disease Sister alive 23 HTN Sister alive 27 osteoarthritis Brother alive 46 TBI from motorcycle accident, HTN Brother died 101 CVA Brother died 43 colon cancer  WBC 9.6 hemoglobin 10.2 MCV 97 platelet count 346; 55 segs 35 lymphs 5 monos 4 eos 1 basophil.  Reticulocyte count 3.3% Coombs test complement positive/IgG negative.  Haptoglobin undetectable.  Cold agglutinin titer greater than 1:4096 Hemoglobin  electrophoresis normal adult pattern. SPEP with IEP showed no paraprotein. IgG 624 IgA 121 IgM 74 Serum free kappa 25.1 lambda 28.3 with a kappa lambda 0.89 B12 500 folate 27.3 ferritin 242 Copper 128 zinc 97 CMP notable for T. bili of 3.3 glucose 102  September 23, 2022: Underwent bone marrow biopsy and aspirate Pathology revealed A minor CD10 positive B-cell population(1% of the lymphocytes) with kappa excess. 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  Did not have a lot of discomfort following the bone marrow Reviewed results of labs and bone marrow results with patient.  Experiencing fatigue.  Having myalgias of legs.  Notes that her urine is dark  October 07 2022:  Rituxan 375 mg/m2  Hgb 8.8 October 14 2022  Rituxan 375 mg/m2.  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 Had a minor infusion rxn during first Rituxan infusion but was able to complete it and receive subsequent infusions.    Review of Systems  Constitutional:  Negative for appetite change, chills, fatigue, fever and unexpected weight change.  HENT:   Negative for mouth sores, nosebleeds, sore throat, trouble swallowing and voice change.   Eyes:  Negative for eye problems and icterus.       Vision changes:  None  Respiratory:  Negative for chest tightness, cough, hemoptysis and wheezing.        Some DOE when does a lot of walking  Cardiovascular:  Positive for leg swelling. Negative for chest pain and palpitations.       PND:  none Orthopnea:  none  Gastrointestinal:  Negative for abdominal pain, blood in stool, constipation, diarrhea, nausea and vomiting.  Endocrine: Negative for hot flashes.       Cold intolerance:  none Heat intolerance:  none  Genitourinary:  Negative for bladder  incontinence, difficulty urinating, dysuria, frequency, hematuria and nocturia.   Musculoskeletal:  Positive for arthralgias. Negative for back pain, gait problem, myalgias and neck pain.       Arthralgias of knees and ankles  Skin:  Negative for itching, rash and wound.  Neurological:  Negative for dizziness, extremity weakness, gait problem, headaches and numbness.  Hematological:  Negative for adenopathy. Does not bruise/bleed easily.  Psychiatric/Behavioral:  Negative for sleep disturbance and suicidal ideas. The patient is not nervous/anxious.     MEDICAL HISTORY: Past Medical History:  Diagnosis Date   Fibroid    Hypertension    Thyroid disease    Nodules    SURGICAL HISTORY: Past Surgical History:  Procedure Laterality Date   ABDOMINAL HYSTERECTOMY  1996   ANKLE SURGERY  04/2011   BONE SPURS IN RIGHT ANKLE   BREAST BIOPSY Left 2019   benign   BREAST BIOPSY Right 2021   FOOT SURGERY  2007 2008   KNEE SURGERY     Arthroscopic   MOLE REMOVAL  2009   BENIGN   THYROID CYST EXCISION     2001    SOCIAL HISTORY: Social History   Socioeconomic History   Marital status: Divorced    Spouse name: Not on file   Number of children: Not on file   Years of education: Not on file   Highest education level: Not on file  Occupational History   Not on file  Tobacco Use   Smoking status: Never   Smokeless tobacco: Never  Vaping Use   Vaping Use: Never used  Substance and Sexual Activity   Alcohol use: No    Alcohol/week: 0.0 standard drinks of alcohol   Drug use: No   Sexual activity: Not Currently    Comment: INTERCOURSE AGE UNKNOWN , SEXUAL PARTNERS MOE THAN 6  Other Topics Concern   Not on file  Social History Narrative   Not on file   Social Determinants of Health   Financial Resource Strain: Not on file  Food Insecurity: Not on file  Transportation Needs: Not on file  Physical Activity: Not on file  Stress: Not on file  Social Connections: Not on file   Intimate Partner Violence: Not on file    FAMILY HISTORY Family History  Problem Relation Age of Onset   Breast cancer Sister 70   Hypertension Sister    Cancer Sister        COLON   Stroke Brother    Hypertension Brother    Breast cancer Maternal Grandmother     ALLERGIES:  is allergic to rituximab.  MEDICATIONS:  Current Outpatient Medications  Medication Sig Dispense Refill   allopurinol (ZYLOPRIM) 300 MG tablet Take 1 tablet by mouth daily.     amLODipine (NORVASC) 5 MG tablet Take 10 tablets by mouth daily.      Ascorbic Acid (VITAMIN C PO) Take by mouth.     atorvastatin (LIPITOR) 10 MG tablet Take 1 tablet by mouth daily.     diclofenac Sodium (VOLTAREN) 1 % GEL Apply topically.     fluticasone (FLONASE) 50 MCG/ACT nasal spray Place 2 sprays  into the nose daily.     folic acid (FOLVITE) 1 MG tablet Take 1 mg by mouth daily.     ipratropium (ATROVENT) 0.06 % nasal spray Place 2 sprays into both nostrils 4 (four) times daily. 15 mL 2   levothyroxine (SYNTHROID) 100 MCG tablet Take 100 mcg by mouth daily.     methotrexate (RHEUMATREX) 2.5 MG tablet take 6 tablets     montelukast (SINGULAIR) 10 MG tablet Take 1 tablet by mouth at bedtime.     Multiple Vitamin (MULTIVITAMIN PO) Take 1 tablet by mouth daily.     triamterene-hydrochlorothiazide (MAXZIDE-25) 37.5-25 MG tablet Take 1 tablet by mouth daily.     No current facility-administered medications for this visit.    PHYSICAL EXAMINATION:  ECOG PERFORMANCE STATUS: 1 - Symptomatic but completely ambulatory   Vitals:   10/28/22 1048  BP: (!) 133/54  Pulse: 90  Resp: 17  Temp: 98.5 F (36.9 C)  SpO2: 100%    Filed Weights   10/28/22 1048  Weight: 254 lb 4.8 oz (115.3 kg)     Physical Exam Vitals and nursing note reviewed.  Constitutional:      General: She is not in acute distress.    Appearance: Normal appearance. She is obese. She is not ill-appearing, toxic-appearing or diaphoretic.     Comments:  Here alone  HENT:     Head: Normocephalic and atraumatic.     Right Ear: External ear normal.     Left Ear: External ear normal.     Nose: Nose normal. No congestion or rhinorrhea.  Eyes:     General: No scleral icterus.    Extraocular Movements: Extraocular movements intact.     Conjunctiva/sclera: Conjunctivae normal.     Pupils: Pupils are equal, round, and reactive to light.  Cardiovascular:     Rate and Rhythm: Normal rate and regular rhythm.     Heart sounds: Normal heart sounds. No murmur heard.    No friction rub. No gallop.  Pulmonary:     Effort: Pulmonary effort is normal. No respiratory distress.     Breath sounds: No stridor. Rhonchi present. No wheezing or rales.  Abdominal:     General: Bowel sounds are normal. There is no distension.     Palpations: Abdomen is soft. There is no mass.     Tenderness: There is no abdominal tenderness. There is no guarding or rebound.     Hernia: No hernia is present.  Musculoskeletal:        General: No swelling, tenderness, deformity or signs of injury.     Cervical back: Normal range of motion and neck supple. No rigidity or tenderness.     Right lower leg: Edema present.     Left lower leg: Edema present.  Lymphadenopathy:     Head:     Right side of head: No submental, submandibular, tonsillar, preauricular, posterior auricular or occipital adenopathy.     Left side of head: No submental, submandibular, tonsillar, preauricular, posterior auricular or occipital adenopathy.     Cervical: No cervical adenopathy.     Right cervical: No superficial, deep or posterior cervical adenopathy.    Left cervical: No superficial, deep or posterior cervical adenopathy.     Upper Body:     Right upper body: No supraclavicular, axillary, pectoral or epitrochlear adenopathy.     Left upper body: No supraclavicular, axillary, pectoral or epitrochlear adenopathy.  Skin:    General: Skin is warm.     Coloration: Skin  is not jaundiced or pale.      Findings: No bruising or erythema.  Neurological:     General: No focal deficit present.     Mental Status: She is alert and oriented to person, place, and time. Mental status is at baseline.     Cranial Nerves: No cranial nerve deficit.     Motor: No weakness.     Gait: Gait normal.  Psychiatric:        Mood and Affect: Mood normal.        Behavior: Behavior normal.        Thought Content: Thought content normal.        Judgment: Judgment normal.     LABORATORY DATA: I have personally reviewed the data as listed:  Appointment on 10/28/2022  Component Date Value Ref Range Status   Retic Ct Pct 10/28/2022 4.0 (H)  0.4 - 3.1 % Final   RBC. 10/28/2022 3.43 (L)  3.87 - 5.11 MIL/uL Final   Retic Count, Absolute 10/28/2022 136.9  19.0 - 186.0 K/uL Final   Immature Retic Fract 10/28/2022 21.1 (H)  2.3 - 15.9 % Final   Performed at [facility], [place] [address], [place], [place] [address]   WBC Count 10/28/2022 8.4  4.0 - 10.5 K/uL Final   RBC 10/28/2022 3.42 (L)  3.87 - 5.11 MIL/uL Final   Hemoglobin 10/28/2022 10.2 (L)  12.0 - 15.0 g/dL Final   HCT 10/28/2022 31.8 (L)  36.0 - 46.0 % Final   MCV 10/28/2022 93.0  80.0 - 100.0 fL Final   MCH 10/28/2022 29.8  26.0 - 34.0 pg Final   MCHC 10/28/2022 32.1  30.0 - 36.0 g/dL Final   RDW 10/28/2022 16.5 (H)  11.5 - 15.5 % Final   Platelet Count 10/28/2022 309  150 - 400 K/uL Final   nRBC 10/28/2022 0.0  0.0 - 0.2 % Final   Neutrophils Relative % 10/28/2022 63  % Final   Neutro Abs 10/28/2022 5.4  1.7 - 7.7 K/uL Final   Lymphocytes Relative 10/28/2022 24  % Final   Lymphs Abs 10/28/2022 2.0  0.7 - 4.0 K/uL Final   Monocytes Relative 10/28/2022 8  % Final   Monocytes Absolute 10/28/2022 0.6  0.1 - 1.0 K/uL Final   Eosinophils Relative 10/28/2022 4  % Final   Eosinophils Absolute 10/28/2022 0.4  0.0 - 0.5 K/uL Final   Basophils Relative 10/28/2022 1  % Final   Basophils Absolute 10/28/2022 0.0  0.0 - 0.1 K/uL Final    Immature Granulocytes 10/28/2022 0  % Final   Abs Immature Granulocytes 10/28/2022 0.02  0.00 - 0.07 K/uL Final   Performed at [facility], [place] [address] [place], [place] [address]  Appointment on 10/21/2022  Component Date Value Ref Range Status   Retic Ct Pct 10/21/2022 4.3 (H)  0.4 - 3.1 % Final   RBC. 10/21/2022 3.32 (L)  3.87 - 5.11 MIL/uL Final   Retic Count, Absolute 10/21/2022 142.1  19.0 - 186.0 K/uL Final   Immature Retic Fract 10/21/2022 13.8  2.3 - 15.9 % Final   Reticulocyte Hemoglobin 10/21/2022 30.8  >27.9 pg Final   Performed at [facility], [place] [address], [place], [place] [address]   Sodium 10/21/2022 140  135 - 145 mmol/L Final   Potassium 10/21/2022 3.6  3.5 - 5.1 mmol/L Final   Chloride 10/21/2022 107  98 - 111 mmol/L Final   CO2 10/21/2022 27  22 - 32 mmol/L Final   Glucose, Bld 10/21/2022 127 (H)  70 - 99 mg/dL Final   Glucose reference range applies only to samples taken after fasting for at least 8 hours.   BUN 10/21/2022 20  8 - 23 mg/dL Final   Creatinine 10/21/2022 0.86  0.44 - 1.00 mg/dL Final   Calcium 10/21/2022 10.5 (H)  8.9 - 10.3 mg/dL Final   Total Protein 10/21/2022 6.0 (L)  6.5 - 8.1 g/dL Final   Albumin 10/21/2022 4.0  3.5 - 5.0 g/dL Final   AST 10/21/2022 25  15 - 41 U/L Final   ALT 10/21/2022 27  0 - 44 U/L Final   Alkaline Phosphatase 10/21/2022 97  38 - 126 U/L Final   Total Bilirubin 10/21/2022 2.6 (H)  0.3 - 1.2 mg/dL Final   GFR, Estimated 10/21/2022 >60  >60 mL/min Final   Comment: (NOTE) Calculated using the CKD-EPI Creatinine Equation (2021)    Anion gap 10/21/2022 6  5 - 15 Final   Performed at [facility], [place] [address] [place], [place] [address]   WBC Count 10/21/2022 7.2  4.0 - 10.5 K/uL Final   RBC 10/21/2022 3.26 (L)  3.87 - 5.11 MIL/uL Final   Hemoglobin 10/21/2022 10.1 (L)  12.0 - 15.0 g/dL Final   HCT 10/21/2022 30.9 (L)  36.0 - 46.0 % Final    MCV 10/21/2022 94.8  80.0 - 100.0 fL Final   MCH 10/21/2022 31.0  26.0 - 34.0 pg Final   MCHC 10/21/2022 32.7  30.0 - 36.0 g/dL Final   CORRECTED FOR COLD AGGLUTININS   RDW 10/21/2022 17.1 (H)  11.5 - 15.5 % Final   Platelet Count 10/21/2022 291  150 - 400 K/uL Final   nRBC 10/21/2022 0.0  0.0 - 0.2 % Final   Neutrophils Relative % 10/21/2022 61  % Final   Neutro Abs 10/21/2022 4.5  1.7 - 7.7 K/uL Final   Lymphocytes Relative 10/21/2022 25  % Final   Lymphs Abs 10/21/2022 1.8  0.7 - 4.0 K/uL Final   Monocytes Relative 10/21/2022 7  % Final   Monocytes Absolute 10/21/2022 0.5  0.1 - 1.0 K/uL Final   Eosinophils Relative 10/21/2022 6  % Final   Eosinophils Absolute 10/21/2022 0.4  0.0 - 0.5 K/uL Final   Basophils Relative 10/21/2022 1  % Final   Basophils Absolute 10/21/2022 0.0  0.0 - 0.1 K/uL Final   Immature Granulocytes 10/21/2022 0  % Final   Abs Immature Granulocytes 10/21/2022 0.03  0.00 - 0.07 K/uL Final   Performed at [facility], [place] [address], [place], [place] [address]  Orders Only on 10/14/2022  Component Date Value Ref Range Status   Retic Ct Pct 10/14/2022 5.8 (H)  0.4 - 3.1 % Final   RBC. 10/14/2022 3.01 (L)  3.87 - 5.11 MIL/uL Final   Retic Count, Absolute 10/14/2022 174.0  19.0 - 186.0 K/uL Final   Immature Retic Fract 10/14/2022 18.2 (H)  2.3 - 15.9 % Final   Reticulocyte Hemoglobin 10/14/2022 29.7  >27.9 pg Final   Performed at [facility], [place] [address], [place], [place] [address]  Appointment on 10/14/2022  Component Date Value Ref Range Status   Sodium 10/14/2022 139  135 - 145 mmol/L Final   Potassium 10/14/2022 4.2  3.5 - 5.1 mmol/L Final   Chloride 10/14/2022 108  98 - 111 mmol/L Final   CO2 10/14/2022 28  22 - 32  mmol/L Final   Glucose, Bld 10/14/2022 138 (H)  70 - 99 mg/dL Final   Glucose reference range applies only to samples taken after fasting for at least 8 hours.   BUN 10/14/2022 18  8 - 23  mg/dL Final   Creatinine 10/14/2022 0.75  0.44 - 1.00 mg/dL Final   Calcium 10/14/2022 8.7 (L)  8.9 - 10.3 mg/dL Final   Total Protein 10/14/2022 6.0 (L)  6.5 - 8.1 g/dL Final   Albumin 10/14/2022 3.9  3.5 - 5.0 g/dL Final   AST 10/14/2022 33  15 - 41 U/L Final   ALT 10/14/2022 25  0 - 44 U/L Final   Alkaline Phosphatase 10/14/2022 90  38 - 126 U/L Final   Total Bilirubin 10/14/2022 4.6 (HH)  0.3 - 1.2 mg/dL Final   CRITICAL RESULT CALLED TO, READ BACK BY AND VERIFIED WITH: MEGAN HODGE, RN AT 1151 BY PAM SUTCAVAGE, MT    GFR, Estimated 10/14/2022 >60  >60 mL/min Final   Comment: (NOTE) Calculated using the CKD-EPI Creatinine Equation (2021)    Anion gap 10/14/2022 3 (L)  5 - 15 Final   Performed at [facility], [place] [address], [place], [place] [address]   WBC Count 10/14/2022 6.5  4.0 - 10.5 K/uL Final   RBC 10/14/2022 3.04 (L)  3.87 - 5.11 MIL/uL Final   Hemoglobin 10/14/2022 9.2 (L)  12.0 - 15.0 g/dL Final   HCT 10/14/2022 28.6 (L)  36.0 - 46.0 % Final   MCV 10/14/2022 94.1  80.0 - 100.0 fL Final   MCH 10/14/2022 30.3  26.0 - 34.0 pg Final   MCHC 10/14/2022 32.2  30.0 - 36.0 g/dL Final   RDW 10/14/2022 17.6 (H)  11.5 - 15.5 % Final   Platelet Count 10/14/2022 270  150 - 400 K/uL Final   nRBC 10/14/2022 0.0  0.0 - 0.2 % Final   Neutrophils Relative % 10/14/2022 61  % Final   Neutro Abs 10/14/2022 4.0  1.7 - 7.7 K/uL Final   Lymphocytes Relative 10/14/2022 26  % Final   Lymphs Abs 10/14/2022 1.7  0.7 - 4.0 K/uL Final   Monocytes Relative 10/14/2022 7  % Final   Monocytes Absolute 10/14/2022 0.5  0.1 - 1.0 K/uL Final   Eosinophils Relative 10/14/2022 4  % Final   Eosinophils Absolute 10/14/2022 0.3  0.0 - 0.5 K/uL Final   Basophils Relative 10/14/2022 1  % Final   Basophils Absolute 10/14/2022 0.0  0.0 - 0.1 K/uL Final   Immature Granulocytes 10/14/2022 1  % Final   Abs Immature Granulocytes 10/14/2022 0.03  0.00 - 0.07 K/uL Final   Performed at [facility], [place] [address], [place], [place] [address]  Appointment on 10/07/2022  Component Date Value Ref Range Status   Hep B Core Total Ab 10/07/2022 NON REACTIVE  NON REACTIVE Final   Performed at [facility], [place] [address] [place], [place] [address]   Hepatitis B Surface Ag 10/07/2022 NON REACTIVE  NON REACTIVE Final   Performed at [facility], [place] [address], [place], [place] [address]   Rhuematoid fact SerPl-aCnc 10/07/2022 10.1  <14.0 IU/mL Final   Comment: (NOTE) Performed At: [facility] [place], [place] [id number] Rush Farmer MD [id number]    ds DNA Ab 10/07/2022 <1  0 - 9 IU/mL Final   Comment: (NOTE)  Negative      <5                                   Equivocal  5 - 9                                   Positive      >9    Ribonucleic Protein 10/07/2022 7.5 (H)  0.0 - 0.9 AI Final   ENA SM Ab Ser-aCnc 10/07/2022 <0.2  0.0 - 0.9 AI Final   Scleroderma (Scl-70) (ENA) Antibod* 10/07/2022 <0.2  0.0 - 0.9 AI Final   SSA (Ro) (ENA) Antibody, IgG 10/07/2022 <0.2  0.0 - 0.9 AI Final   SSB (La) (ENA) Antibody, IgG 10/07/2022 <0.2  0.0 - 0.9 AI Final   Chromatin Ab SerPl-aCnc 10/07/2022 <0.2  0.0 - 0.9 AI Final   Anti JO-1 10/07/2022 <0.2  0.0 - 0.9 AI Final   Centromere Ab Screen 10/07/2022 <0.2  0.0 - 0.9 AI Final   See below: 10/07/2022 Comment   Final   Comment: (NOTE) Autoantibody                       Disease Association ------------------------------------------------------------                        Condition                  Frequency ---------------------   ------------------------   --------- Antinuclear Antibody,    SLE, mixed connective Direct (ANA-D)           tissue diseases ---------------------   ------------------------   --------- dsDNA                    SLE                        40 - 60% ---------------------   ------------------------    --------- Chromatin                Drug induced SLE                90%                         SLE                        48 - 97% ---------------------   ------------------------   --------- SSA (Ro)                 SLE                        25 - 35%                         Sjogren's Syndrome         40 - 70%                         Neonatal Lupus                 100% ---------------------   ------------------------   --------- SSB (La)  SLE                                                       10%                         Sjogren's Syndrome              30% ---------------------   -----------------------    --------- Sm (anti-Smith)          SLE                        15 - 30% ---------------------   -----------------------    --------- RNP                      Mixed Connective Tissue                         Disease                         95% (U1 nRNP,                SLE                        30 - 50% anti-ribonucleoprotein)  Polymyositis and/or                         Dermatomyositis                 20% ---------------------   ------------------------   --------- Scl-70 (antiDNA          Scleroderma (diffuse)      20 - 35% topoisomerase)           Crest                           13% ---------------------   ------------------------   --------- Jo-1                     Polymyositis and/or                         Dermatomyositis            20 - 40% ---------------------   ------------------------   --------- Centromere B             Scleroderma -                           Crest                         variant                         80% Performed At: [facility] [employer] [place], [place] [id number] Rush Farmer MD [id number]     RADIOGRAPHIC STUDIES: I have personally reviewed the radiological images as listed and agree with the findings in the report  No results found.  ASSESSMENT/PLAN  71 y.o. female with  medical history notable for  thyroid nodule, hypothyroidism, morbid obesity, obstructive sleep apnea, bilateral hearing loss, hypertension, rheumatoid arthritis, chronic diarrhea, hypertension, gout.  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 Labs notable for  Coombs (IgG negative/ Complement positive) Haptoglobin < 10 Cold agglutinin titer greater than 1:4096  Bilirubin 3.3 Smear showed RBC clumping.  Hgb 10.2 Bone marrow bx showed expanded erythron and  monoclonal B cell population (1%) with kappa restriction.  Has been reported in patients with systemic rheumatic diseases   Therapeutics:  Since patient had a high Cold agglutinin titer was symptomatic with the disease on October 14 2022 began a course of Rituxan 375 mg/m2 IV weekly x 4.  Hgb has trended upward since starting treatment Will recheck CBC with diff, retic, CMP, LDH, Haptoglobin.  To receive Rituxan today  Positive RNP: May be related to history of RA.     Reminded patient of the importance of avoiding cold as this can exacerbate hemolysis.  Taking folic acid 1 mg daily to support RBC production  and allopurinol due to increased uric acid    Cancer Staging  No matching staging information was found for the patient.   No problem-specific Assessment & Plan notes found for this encounter.   Orders Placed This Encounter  Procedures   CBC with Differential ([place] Only)    Standing Status:   Future    Number of Occurrences:   1    Standing Expiration Date:   10/29/2023   Comprehensive metabolic panel   Lactate dehydrogenase    Standing Status:   Future    Number of Occurrences:   1    Standing Expiration Date:   10/29/2023   Haptoglobin    Standing Status:   Future    Number of Occurrences:   1    Standing Expiration Date:   10/29/2023   Reticulocytes    Standing Status:   Future    Number of Occurrences:   1    Standing  Expiration Date:   10/29/2023   Direct antiglobulin test (Coombs)    Standing Status:   Future    Number of Occurrences:   1    Standing Expiration Date:   10/28/2023    All questions were answered. The patient knows to call the clinic with any problems, questions or concerns.  This note was electronically signed.    Barbee Cough, MD  10/28/2022 11:06 AM

## 2022-10-28 ENCOUNTER — Inpatient Hospital Stay (HOSPITAL_BASED_OUTPATIENT_CLINIC_OR_DEPARTMENT_OTHER): Payer: Medicare PPO | Admitting: Oncology

## 2022-10-28 ENCOUNTER — Inpatient Hospital Stay: Payer: Medicare PPO | Attending: Oncology

## 2022-10-28 ENCOUNTER — Inpatient Hospital Stay: Payer: Medicare PPO

## 2022-10-28 VITALS — BP 133/54 | HR 90 | Temp 98.5°F | Resp 17 | Wt 254.3 lb

## 2022-10-28 VITALS — BP 124/53 | HR 81 | Temp 98.2°F | Resp 18

## 2022-10-28 DIAGNOSIS — I10 Essential (primary) hypertension: Secondary | ICD-10-CM | POA: Diagnosis not present

## 2022-10-28 DIAGNOSIS — R768 Other specified abnormal immunological findings in serum: Secondary | ICD-10-CM

## 2022-10-28 DIAGNOSIS — Z800 Family history of malignant neoplasm of digestive organs: Secondary | ICD-10-CM | POA: Diagnosis not present

## 2022-10-28 DIAGNOSIS — E039 Hypothyroidism, unspecified: Secondary | ICD-10-CM | POA: Diagnosis not present

## 2022-10-28 DIAGNOSIS — Z7189 Other specified counseling: Secondary | ICD-10-CM | POA: Diagnosis not present

## 2022-10-28 DIAGNOSIS — Z5112 Encounter for antineoplastic immunotherapy: Secondary | ICD-10-CM | POA: Diagnosis not present

## 2022-10-28 DIAGNOSIS — R110 Nausea: Secondary | ICD-10-CM | POA: Insufficient documentation

## 2022-10-28 DIAGNOSIS — M069 Rheumatoid arthritis, unspecified: Secondary | ICD-10-CM

## 2022-10-28 DIAGNOSIS — M109 Gout, unspecified: Secondary | ICD-10-CM | POA: Insufficient documentation

## 2022-10-28 DIAGNOSIS — D479 Neoplasm of uncertain behavior of lymphoid, hematopoietic and related tissue, unspecified: Secondary | ICD-10-CM

## 2022-10-28 DIAGNOSIS — G4733 Obstructive sleep apnea (adult) (pediatric): Secondary | ICD-10-CM | POA: Insufficient documentation

## 2022-10-28 DIAGNOSIS — H9193 Unspecified hearing loss, bilateral: Secondary | ICD-10-CM | POA: Insufficient documentation

## 2022-10-28 DIAGNOSIS — D5912 Cold autoimmune hemolytic anemia: Secondary | ICD-10-CM | POA: Diagnosis not present

## 2022-10-28 DIAGNOSIS — D649 Anemia, unspecified: Secondary | ICD-10-CM | POA: Insufficient documentation

## 2022-10-28 LAB — CBC WITH DIFFERENTIAL (CANCER CENTER ONLY)
Abs Immature Granulocytes: 0.02 10*3/uL (ref 0.00–0.07)
Basophils Absolute: 0 10*3/uL (ref 0.0–0.1)
Basophils Relative: 1 %
Eosinophils Absolute: 0.4 10*3/uL (ref 0.0–0.5)
Eosinophils Relative: 4 %
HCT: 31.8 % — ABNORMAL LOW (ref 36.0–46.0)
Hemoglobin: 10.2 g/dL — ABNORMAL LOW (ref 12.0–15.0)
Immature Granulocytes: 0 %
Lymphocytes Relative: 24 %
Lymphs Abs: 2 10*3/uL (ref 0.7–4.0)
MCH: 29.8 pg (ref 26.0–34.0)
MCHC: 32.1 g/dL (ref 30.0–36.0)
MCV: 93 fL (ref 80.0–100.0)
Monocytes Absolute: 0.6 10*3/uL (ref 0.1–1.0)
Monocytes Relative: 8 %
Neutro Abs: 5.4 10*3/uL (ref 1.7–7.7)
Neutrophils Relative %: 63 %
Platelet Count: 309 10*3/uL (ref 150–400)
RBC: 3.42 MIL/uL — ABNORMAL LOW (ref 3.87–5.11)
RDW: 16.5 % — ABNORMAL HIGH (ref 11.5–15.5)
WBC Count: 8.4 10*3/uL (ref 4.0–10.5)
nRBC: 0 % (ref 0.0–0.2)

## 2022-10-28 LAB — COMPREHENSIVE METABOLIC PANEL
ALT: 21 U/L (ref 0–44)
AST: 20 U/L (ref 15–41)
Albumin: 4 g/dL (ref 3.5–5.0)
Alkaline Phosphatase: 83 U/L (ref 38–126)
Anion gap: 7 (ref 5–15)
BUN: 16 mg/dL (ref 8–23)
CO2: 27 mmol/L (ref 22–32)
Calcium: 9.7 mg/dL (ref 8.9–10.3)
Chloride: 107 mmol/L (ref 98–111)
Creatinine, Ser: 0.69 mg/dL (ref 0.44–1.00)
GFR, Estimated: >60 mL/min (ref >60)
Glucose, Bld: 137 mg/dL — ABNORMAL HIGH (ref 70–99)
Potassium: 3.5 mmol/L (ref 3.5–5.1)
Sodium: 141 mmol/L (ref 135–145)
Total Bilirubin: 2.3 mg/dL — ABNORMAL HIGH (ref 0.3–1.2)
Total Protein: 6.2 g/dL — ABNORMAL LOW (ref 6.5–8.1)

## 2022-10-28 LAB — DIRECT ANTIGLOBULIN TEST (NOT AT ARMC)
DAT, IgG: NEGATIVE
DAT, complement: POSITIVE

## 2022-10-28 LAB — LACTATE DEHYDROGENASE: LDH: 208 U/L — ABNORMAL HIGH (ref 98–192)

## 2022-10-28 LAB — RETICULOCYTES
Immature Retic Fract: 21.1 % — ABNORMAL HIGH (ref 2.3–15.9)
RBC.: 3.43 MIL/uL — ABNORMAL LOW (ref 3.87–5.11)
Retic Count, Absolute: 136.9 10*3/uL (ref 19.0–186.0)
Retic Ct Pct: 4 % — ABNORMAL HIGH (ref 0.4–3.1)

## 2022-10-28 MED ORDER — METHYLPREDNISOLONE SODIUM SUCC 125 MG IJ SOLR
62.5000 mg | Freq: Once | INTRAMUSCULAR | Status: AC
  Administered 2022-10-28: 62.5 mg via INTRAVENOUS
  Filled 2022-10-28: qty 2

## 2022-10-28 MED ORDER — SODIUM CHLORIDE 0.9 % IV SOLN: Freq: Once | INTRAVENOUS | Status: AC

## 2022-10-28 MED ORDER — FAMOTIDINE IN NACL 20-0.9 MG/50ML-% IV SOLN
20.0000 mg | Freq: Once | INTRAVENOUS | Status: AC
  Administered 2022-10-28: 20 mg via INTRAVENOUS
  Filled 2022-10-28: qty 50

## 2022-10-28 MED ORDER — DIPHENHYDRAMINE HCL 25 MG PO CAPS
50.0000 mg | ORAL_CAPSULE | Freq: Once | ORAL | Status: AC
  Administered 2022-10-28: 50 mg via ORAL
  Filled 2022-10-28: qty 2

## 2022-10-28 MED ORDER — SODIUM CHLORIDE 0.9 % IV SOLN
375.0000 mg/m2 | Freq: Once | INTRAVENOUS | Status: AC
  Administered 2022-10-28: 900 mg via INTRAVENOUS
  Filled 2022-10-28: qty 50

## 2022-10-28 MED ORDER — ACETAMINOPHEN 325 MG PO TABS
650.0000 mg | ORAL_TABLET | Freq: Once | ORAL | Status: AC
  Administered 2022-10-28: 650 mg via ORAL
  Filled 2022-10-28: qty 2

## 2022-10-28 NOTE — Patient Instructions (Signed)
[facility]  Discharge Instructions: Thank you for choosing [facility] to provide your oncology and hematology care.   If you have a lab appointment with the Cancer Center, please go directly to the Cancer Center and check in at the registration area.   Wear comfortable clothing and clothing appropriate for easy access to any Portacath or PICC line.   We strive to give you quality time with your provider. You may need to reschedule your appointment if you arrive late (15 or more minutes).  Arriving late affects you and other patients whose appointments are after yours.  Also, if you miss three or more appointments without notifying the office, you may be dismissed from the clinic at the provider's discretion.      For prescription refill requests, have your pharmacy contact our office and allow 72 hours for refills to be completed.    Today you received the following chemotherapy and/or immunotherapy agents : Rituximab      To help prevent nausea and vomiting after your treatment, we encourage you to take your nausea medication as directed.  BELOW ARE SYMPTOMS THAT SHOULD BE REPORTED IMMEDIATELY: *FEVER GREATER THAN 100.4 F (38 C) OR HIGHER *CHILLS OR SWEATING *NAUSEA AND VOMITING THAT IS NOT CONTROLLED WITH YOUR NAUSEA MEDICATION *UNUSUAL SHORTNESS OF BREATH *UNUSUAL BRUISING OR BLEEDING *URINARY PROBLEMS (pain or burning when urinating, or frequent urination) *BOWEL PROBLEMS (unusual diarrhea, constipation, pain near the anus) TENDERNESS IN MOUTH AND THROAT WITH OR WITHOUT PRESENCE OF ULCERS (sore throat, sores in mouth, or a toothache) UNUSUAL RASH, SWELLING OR PAIN  UNUSUAL VAGINAL DISCHARGE OR ITCHING   Items with * indicate a potential emergency and should be followed up as soon as possible or go to the Emergency Department if any problems should occur.  Please show the CHEMOTHERAPY ALERT CARD or IMMUNOTHERAPY ALERT CARD at check-in to  the Emergency Department and triage nurse.  Should you have questions after your visit or need to cancel or reschedule your appointment, please contact [facility]  Dept: [phone]  and follow the prompts.  Office hours are 8:00 a.m. to 4:30 p.m. Monday - Friday. Please note that voicemails left after 4:00 p.m. may not be returned until the following business day.  We are closed weekends and major holidays. You have access to a nurse at all times for urgent questions. Please call the main number to the clinic Dept: [phone] and follow the prompts.   For any non-urgent questions, you may also contact your provider using MyChart. We now offer e-Visits for anyone 18 and older to request care online for non-urgent symptoms. For details visit [email or web address].   Also download the MyChart app! Go to the app store, search "MyChart", open the app, select [facility], and log in with your MyChart username and password.  Masks are optional in the cancer centers. If you would like for your care team to wear a mask while they are taking care of you, please let them know. You may have one support person who is at least 70 years old accompany you for your appointments. 

## 2022-10-30 LAB — HAPTOGLOBIN: Haptoglobin: 49 mg/dL (ref 37–355)

## 2022-10-31 ENCOUNTER — Telehealth: Payer: Self-pay | Admitting: Oncology

## 2022-10-31 NOTE — Telephone Encounter (Signed)
Called patient to confirm upcoming appointment. Patient notified.

## 2022-11-01 ENCOUNTER — Other Ambulatory Visit: Payer: Self-pay

## 2022-11-04 ENCOUNTER — Other Ambulatory Visit: Payer: Self-pay

## 2022-11-07 ENCOUNTER — Encounter: Payer: Self-pay | Admitting: Oncology

## 2022-11-25 ENCOUNTER — Ambulatory Visit: Payer: Medicare PPO | Admitting: Oncology

## 2022-11-25 ENCOUNTER — Other Ambulatory Visit: Payer: Self-pay

## 2022-11-25 ENCOUNTER — Other Ambulatory Visit: Payer: Medicare PPO

## 2022-12-08 ENCOUNTER — Other Ambulatory Visit: Payer: Self-pay

## 2022-12-08 DIAGNOSIS — R768 Other specified abnormal immunological findings in serum: Secondary | ICD-10-CM

## 2022-12-09 ENCOUNTER — Inpatient Hospital Stay (HOSPITAL_BASED_OUTPATIENT_CLINIC_OR_DEPARTMENT_OTHER): Payer: Medicare PPO | Admitting: Oncology

## 2022-12-09 ENCOUNTER — Inpatient Hospital Stay: Payer: Medicare PPO | Attending: Oncology

## 2022-12-09 DIAGNOSIS — D5912 Cold autoimmune hemolytic anemia: Secondary | ICD-10-CM | POA: Insufficient documentation

## 2022-12-09 DIAGNOSIS — Z800 Family history of malignant neoplasm of digestive organs: Secondary | ICD-10-CM | POA: Diagnosis not present

## 2022-12-09 DIAGNOSIS — G4733 Obstructive sleep apnea (adult) (pediatric): Secondary | ICD-10-CM | POA: Insufficient documentation

## 2022-12-09 DIAGNOSIS — D479 Neoplasm of uncertain behavior of lymphoid, hematopoietic and related tissue, unspecified: Secondary | ICD-10-CM | POA: Diagnosis present

## 2022-12-09 DIAGNOSIS — H9193 Unspecified hearing loss, bilateral: Secondary | ICD-10-CM | POA: Diagnosis not present

## 2022-12-09 DIAGNOSIS — R110 Nausea: Secondary | ICD-10-CM | POA: Insufficient documentation

## 2022-12-09 DIAGNOSIS — M069 Rheumatoid arthritis, unspecified: Secondary | ICD-10-CM | POA: Insufficient documentation

## 2022-12-09 DIAGNOSIS — R768 Other specified abnormal immunological findings in serum: Secondary | ICD-10-CM

## 2022-12-09 DIAGNOSIS — M109 Gout, unspecified: Secondary | ICD-10-CM | POA: Insufficient documentation

## 2022-12-09 DIAGNOSIS — E039 Hypothyroidism, unspecified: Secondary | ICD-10-CM | POA: Diagnosis not present

## 2022-12-09 DIAGNOSIS — I10 Essential (primary) hypertension: Secondary | ICD-10-CM | POA: Insufficient documentation

## 2022-12-09 DIAGNOSIS — M791 Myalgia, unspecified site: Secondary | ICD-10-CM | POA: Diagnosis not present

## 2022-12-09 DIAGNOSIS — D649 Anemia, unspecified: Secondary | ICD-10-CM | POA: Insufficient documentation

## 2022-12-09 DIAGNOSIS — R5383 Other fatigue: Secondary | ICD-10-CM | POA: Diagnosis not present

## 2022-12-09 LAB — CBC WITH DIFFERENTIAL (CANCER CENTER ONLY)
Abs Immature Granulocytes: 0.01 10*3/uL (ref 0.00–0.07)
Basophils Absolute: 0 10*3/uL (ref 0.0–0.1)
Basophils Relative: 1 %
Eosinophils Absolute: 0.3 10*3/uL (ref 0.0–0.5)
Eosinophils Relative: 5 %
HCT: 32.1 % — ABNORMAL LOW (ref 36.0–46.0)
Hemoglobin: 11.7 g/dL — ABNORMAL LOW (ref 12.0–15.0)
Immature Granulocytes: 0 %
Lymphocytes Relative: 32 %
Lymphs Abs: 1.7 10*3/uL (ref 0.7–4.0)
MCH: 34.5 pg — ABNORMAL HIGH (ref 26.0–34.0)
MCHC: 36.4 g/dL — ABNORMAL HIGH (ref 30.0–36.0)
MCV: 94.7 fL (ref 80.0–100.0)
Monocytes Absolute: 0.3 10*3/uL (ref 0.1–1.0)
Monocytes Relative: 6 %
Neutro Abs: 3.1 10*3/uL (ref 1.7–7.7)
Neutrophils Relative %: 56 %
Platelet Count: 289 10*3/uL (ref 150–400)
RBC: 3.39 MIL/uL — ABNORMAL LOW (ref 3.87–5.11)
RDW: 16.8 % — ABNORMAL HIGH (ref 11.5–15.5)
WBC Count: 5.5 10*3/uL (ref 4.0–10.5)
nRBC: 0 % (ref 0.0–0.2)

## 2022-12-09 LAB — RETICULOCYTES
Immature Retic Fract: 9.4 % (ref 2.3–15.9)
RBC.: 3.65 MIL/uL — ABNORMAL LOW (ref 3.87–5.11)
Retic Count, Absolute: 56.6 10*3/uL (ref 19.0–186.0)
Retic Ct Pct: 1.6 % (ref 0.4–3.1)

## 2022-12-09 LAB — COMPREHENSIVE METABOLIC PANEL
ALT: 36 U/L (ref 0–44)
AST: 38 U/L (ref 15–41)
Albumin: 3.7 g/dL (ref 3.5–5.0)
Alkaline Phosphatase: 89 U/L (ref 38–126)
Anion gap: 5 (ref 5–15)
BUN: 13 mg/dL (ref 8–23)
CO2: 28 mmol/L (ref 22–32)
Calcium: 9.5 mg/dL (ref 8.9–10.3)
Chloride: 109 mmol/L (ref 98–111)
Creatinine, Ser: 0.55 mg/dL (ref 0.44–1.00)
GFR, Estimated: >60 mL/min (ref >60)
Glucose, Bld: 84 mg/dL (ref 70–99)
Potassium: 3.9 mmol/L (ref 3.5–5.1)
Sodium: 142 mmol/L (ref 135–145)
Total Bilirubin: 1.1 mg/dL (ref 0.3–1.2)
Total Protein: 5.9 g/dL — ABNORMAL LOW (ref 6.5–8.1)

## 2022-12-09 LAB — LACTATE DEHYDROGENASE: LDH: 255 U/L — ABNORMAL HIGH (ref 98–192)

## 2022-12-09 NOTE — Progress Notes (Signed)
[facility] Follow up Visit:  Patient Care Team: Audley Hose, MD as PCP - General (Internal Medicine) Barbee Cough, MD as Attending Physician (Hematology)  CHIEF COMPLAINTS/PURPOSE OF CONSULTATION:  HISTORY OF PRESENTING ILLNESS: [facility] 72 y.o. female is here because of anemia Medical history notable for thyroid nodule, hypothyroidism, morbid obesity, obstructive sleep apnea, bilateral hearing loss, hypertension, rheumatoid arthritis, chronic diarrhea, hypertension, gout  March 22 2022:  Colonoscopy.  3 very small polyps  July 29, 2022: WBC 7.2 hemoglobin 10.3 MCV 93 platelet count 311; 45 segs 44 lymphs 5 monos 5 eos 1 basophil.  Specimen was prewarmed to 37 degrees to obtain results.  Technician suspected possibility of cold agglutinins/cryoglobulins present Chemistries notable for creatinine 0.79 albumin 3.9.  AST ALT and alk phos normal T. bili 2.3 direct bilirubin 0.6.  Reticulocyte count 2.8%  September 09 2022:  [facility] Hematology Consult Was told years ago that she was anemic.   Has never taken oral iron.  No history of IV iron or PRBC's.   Does not take ASA.  Mainly takes Tylenol for pain; rarely ibuprofen No pica to ice.  Uses CPAP every night  Social:  Divorced.  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  Tobacco none.  EtOH  [facility] Mother died 30 MI Father died 93 pneumonia Sister died 48 CVA, MS Sister alive 104 colon cancer Sister alive 45 Parkinson's disease Sister alive 30 HTN Sister alive 36 osteoarthritis Brother alive 78 TBI from motorcycle accident, HTN Brother died 14 CVA Brother died 72 colon cancer  WBC 9.6 hemoglobin 10.2 MCV 97 platelet count 346; 55 segs 35 lymphs 5 monos 4 eos 1 basophil.  Reticulocyte count 3.3% Coombs test complement positive/IgG negative.  Haptoglobin undetectable.  Cold agglutinin titer greater than 1:4096 Hemoglobin electrophoresis normal adult pattern. SPEP with IEP  showed no paraprotein. IgG 624 IgA 121 IgM 74 Serum free kappa 25.1 lambda 28.3 with a kappa lambda 0.89 B12 500 folate 27.3 ferritin 242 Copper 128 zinc 97 CMP notable for T. bili of 3.3 glucose 102  September 23, 2022: Underwent bone marrow biopsy and aspirate Pathology revealed A minor CD10 positive B-cell population(1% of the lymphocytes) with kappa excess. 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  Did not have a lot of discomfort following the bone marrow Reviewed results of labs and bone marrow results with patient.  Experiencing fatigue.  Having myalgias of legs.  Notes that her urine is dark  October 07 2022:  Rituxan 375 mg/m2  Hgb 8.8 October 14 2022  Rituxan 375 mg/m2.  Hgb 9.2  October 21 2022:  Rituxan 375 mg/m2  Hgb 10.1  October 28 2022:   Had a minor infusion rxn during first Rituxan infusion but was able to complete it and receive subsequent infusions.    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 Reviewed results of labs with patient and granddaughter.  Feels well.  Had COVID around Christmas for which she took Paxlovid.    WBC 5.5 hemoglobin 11.7 MCV 94.7 platelet count 289; 56 segs 32 lymphs 6 monos 5 eos 1 basophil reticulocyte count 1.6% Coombs positive for complement negative for IgG.  Haptoglobin 74 LDH 255 CMP notable for total protein 5.9  Review of Systems  Constitutional:  Negative for appetite change, chills, fatigue, fever and unexpected weight change.  HENT:  Negative for mouth sores, nosebleeds, sore throat, trouble swallowing and voice change.   Eyes:  Negative for eye problems and icterus.       Vision changes:  None  Respiratory:  Negative for chest tightness, cough, hemoptysis and wheezing.        Some DOE when does a lot of walking  Cardiovascular:  Positive for leg swelling.  Negative for chest pain and palpitations.       PND:  none Orthopnea:  none  Gastrointestinal:  Negative for abdominal pain, blood in stool, constipation, diarrhea, nausea and vomiting.  Endocrine: Negative for hot flashes.       Cold intolerance:  none Heat intolerance:  none  Genitourinary:  Negative for bladder incontinence, difficulty urinating, dysuria, frequency, hematuria and nocturia.   Musculoskeletal:  Positive for arthralgias. Negative for back pain, gait problem, myalgias and neck pain.       Arthralgias of knees and ankles  Skin:  Negative for itching, rash and wound.  Neurological:  Negative for dizziness, extremity weakness, gait problem, headaches and numbness.  Hematological:  Negative for adenopathy. Does not bruise/bleed easily.  Psychiatric/Behavioral:  Negative for sleep disturbance and suicidal ideas. The patient is not nervous/anxious.     MEDICAL HISTORY: Past Medical History:  Diagnosis Date   Fibroid    Hypertension    Thyroid disease    Nodules    SURGICAL HISTORY: Past Surgical History:  Procedure Laterality Date   ABDOMINAL HYSTERECTOMY  1996   ANKLE SURGERY  04/2011   BONE SPURS IN RIGHT ANKLE   BREAST BIOPSY Left 2019   benign   BREAST BIOPSY Right 2021   FOOT SURGERY  2007 2008   KNEE SURGERY     Arthroscopic   MOLE REMOVAL  2009   BENIGN   THYROID CYST EXCISION     2001    SOCIAL HISTORY: Social History   Socioeconomic History   Marital status: Divorced    Spouse name: Not on file   Number of children: Not on file   Years of education: Not on file   Highest education level: Not on file  Occupational History   Not on file  Tobacco Use   Smoking status: Never   Smokeless tobacco: Never  Vaping Use   Vaping Use: Never used  Substance and Sexual Activity   Alcohol use: No    Alcohol/week: 0.0 standard drinks of alcohol   Drug use: No   Sexual activity: Not Currently    Comment: INTERCOURSE AGE UNKNOWN , SEXUAL PARTNERS MOE  THAN 5  Other Topics Concern   Not on file  Social History Narrative   Not on file   Social Determinants of Health   Financial Resource Strain: Not on file  Food Insecurity: Not on file  Transportation Needs: Not on file  Physical Activity: Not on file  Stress: Not on file  Social Connections: Not on file  Intimate Partner Violence: Not on file    FAMILY HISTORY Family History  Problem Relation Age of Onset   Breast cancer Sister 31   Hypertension Sister    Cancer Sister        COLON   Stroke Brother    Hypertension Brother    Breast cancer Maternal Grandmother     ALLERGIES:  is allergic to rituximab.  MEDICATIONS:  Current Outpatient Medications  Medication Sig Dispense Refill   allopurinol (ZYLOPRIM) 300 MG tablet Take 1 tablet by mouth daily.     amLODipine (NORVASC) 5  MG tablet Take 10 tablets by mouth daily.      Ascorbic Acid (VITAMIN C PO) Take by mouth.     atorvastatin (LIPITOR) 10 MG tablet Take 1 tablet by mouth daily.     diclofenac Sodium (VOLTAREN) 1 % GEL Apply topically.     fluticasone (FLONASE) 50 MCG/ACT nasal spray Place 2 sprays into the nose daily.     folic acid (FOLVITE) 1 MG tablet Take 1 mg by mouth daily.     ipratropium (ATROVENT) 0.06 % nasal spray Place 2 sprays into both nostrils 4 (four) times daily. 15 mL 2   levothyroxine (SYNTHROID) 100 MCG tablet Take 100 mcg by mouth daily.     methotrexate (RHEUMATREX) 2.5 MG tablet take 6 tablets     montelukast (SINGULAIR) 10 MG tablet Take 1 tablet by mouth at bedtime.     Multiple Vitamin (MULTIVITAMIN PO) Take 1 tablet by mouth daily.     triamterene-hydrochlorothiazide (MAXZIDE-25) 37.5-25 MG tablet Take 1 tablet by mouth daily.     No current facility-administered medications for this visit.    PHYSICAL EXAMINATION:  ECOG PERFORMANCE STATUS: 1 - Symptomatic but completely ambulatory   There were no vitals filed for this visit.   There were no vitals filed for this  visit.    Physical Exam Vitals and nursing note reviewed.  Constitutional:      General: She is not in acute distress.    Appearance: Normal appearance. She is obese. She is not ill-appearing, toxic-appearing or diaphoretic.     Comments: Here with granddaughter  HENT:     Head: Normocephalic and atraumatic.     Right Ear: External ear normal.     Left Ear: External ear normal.     Nose: Nose normal. No congestion or rhinorrhea.  Eyes:     General: No scleral icterus.    Extraocular Movements: Extraocular movements intact.     Conjunctiva/sclera: Conjunctivae normal.     Pupils: Pupils are equal, round, and reactive to light.  Cardiovascular:     Rate and Rhythm: Normal rate and regular rhythm.     Heart sounds: Normal heart sounds. No murmur heard.    No friction rub. No gallop.  Pulmonary:     Effort: Pulmonary effort is normal. No respiratory distress.     Breath sounds: No stridor. Rhonchi present. No wheezing or rales.  Abdominal:     General: Bowel sounds are normal. There is no distension.     Palpations: Abdomen is soft. There is no mass.     Tenderness: There is no abdominal tenderness. There is no guarding or rebound.     Hernia: No hernia is present.  Musculoskeletal:        General: No swelling, tenderness, deformity or signs of injury.     Cervical back: Normal range of motion and neck supple. No rigidity or tenderness.     Right lower leg: Edema present.     Left lower leg: Edema present.  Lymphadenopathy:     Head:     Right side of head: No submental, submandibular, tonsillar, preauricular, posterior auricular or occipital adenopathy.     Left side of head: No submental, submandibular, tonsillar, preauricular, posterior auricular or occipital adenopathy.     Cervical: No cervical adenopathy.     Right cervical: No superficial, deep or posterior cervical adenopathy.    Left cervical: No superficial, deep or posterior cervical adenopathy.     Upper Body:  Right upper body: No supraclavicular, axillary, pectoral or epitrochlear adenopathy.     Left upper body: No supraclavicular, axillary, pectoral or epitrochlear adenopathy.  Skin:    General: Skin is warm.     Coloration: Skin is not jaundiced or pale.     Findings: No bruising or erythema.  Neurological:     General: No focal deficit present.     Mental Status: She is alert and oriented to person, place, and time. Mental status is at baseline.     Cranial Nerves: No cranial nerve deficit.     Motor: No weakness.     Gait: Gait normal.  Psychiatric:        Mood and Affect: Mood normal.        Behavior: Behavior normal.        Thought Content: Thought content normal.        Judgment: Judgment normal.     LABORATORY DATA: I have personally reviewed the data as listed:  Appointment on 12/09/2022  Component Date Value Ref Range Status   Haptoglobin 12/09/2022 74  37 - 355 mg/dL Final   Comment: (NOTE) Performed At: [facility] [place], [place] [id number] Rush Farmer MD RX:[id number]    Retic Ct Pct 12/09/2022 1.6  0.4 - 3.1 % Final   RBC. 12/09/2022 3.65 (L)  3.87 - 5.11 MIL/uL Final   Retic Count, Absolute 12/09/2022 56.6  19.0 - 186.0 K/uL Final   Immature Retic Fract 12/09/2022 9.4  2.3 - 15.9 % Final   Performed at [facility], [place] [address] [place], [place] [address]   LDH 12/09/2022 255 (H)  98 - 192 U/L Final   Performed at [facility], [place] [address] [place], [place] [address]   Sodium 12/09/2022 142  135 - 145 mmol/L Final   Potassium 12/09/2022 3.9  3.5 - 5.1 mmol/L Final   Chloride 12/09/2022 109  98 - 111 mmol/L Final   CO2 12/09/2022 28  22 - 32 mmol/L Final   Glucose, Bld 12/09/2022 84  70 - 99 mg/dL Final   Glucose reference range applies only to samples taken after fasting for at least 8 hours.   BUN 12/09/2022 13  8 - 23 mg/dL Final   Creatinine, Ser 12/09/2022 0.55  0.44 - 1.00  mg/dL Final   Calcium 12/09/2022 9.5  8.9 - 10.3 mg/dL Final   Total Protein 12/09/2022 5.9 (L)  6.5 - 8.1 g/dL Final   Albumin 12/09/2022 3.7  3.5 - 5.0 g/dL Final   AST 12/09/2022 38  15 - 41 U/L Final   ALT 12/09/2022 36  0 - 44 U/L Final   Alkaline Phosphatase 12/09/2022 89  38 - 126 U/L Final   Total Bilirubin 12/09/2022 1.1  0.3 - 1.2 mg/dL Final   GFR, Estimated 12/09/2022 >60  >60 mL/min Final   Comment: (NOTE) Calculated using the CKD-EPI Creatinine Equation (2021)    Anion gap 12/09/2022 5  5 - 15 Final   Performed at [facility], [place] [address], [place], [place] [address]   WBC Count 12/09/2022 5.5  4.0 - 10.5 K/uL Final   RBC 12/09/2022 3.39 (L)  3.87 - 5.11 MIL/uL Final   Hemoglobin 12/09/2022 11.7 (L)  12.0 - 15.0 g/dL Final   HCT 12/09/2022 32.1 (L)  36.0 - 46.0 % Final   MCV 12/09/2022 94.7  80.0 - 100.0 fL Final   MCH 12/09/2022 34.5 (H)  26.0 -  34.0 pg Final   MCHC 12/09/2022 36.4 (H)  30.0 - 36.0 g/dL Final   RDW 12/09/2022 16.8 (H)  11.5 - 15.5 % Final   Platelet Count 12/09/2022 289  150 - 400 K/uL Final   nRBC 12/09/2022 0.0  0.0 - 0.2 % Final   Neutrophils Relative % 12/09/2022 56  % Final   Neutro Abs 12/09/2022 3.1  1.7 - 7.7 K/uL Final   Lymphocytes Relative 12/09/2022 32  % Final   Lymphs Abs 12/09/2022 1.7  0.7 - 4.0 K/uL Final   Monocytes Relative 12/09/2022 6  % Final   Monocytes Absolute 12/09/2022 0.3  0.1 - 1.0 K/uL Final   Eosinophils Relative 12/09/2022 5  % Final   Eosinophils Absolute 12/09/2022 0.3  0.0 - 0.5 K/uL Final   Basophils Relative 12/09/2022 1  % Final   Basophils Absolute 12/09/2022 0.0  0.0 - 0.1 K/uL Final   Immature Granulocytes 12/09/2022 0  % Final   Abs Immature Granulocytes 12/09/2022 0.01  0.00 - 0.07 K/uL Final   Performed at [facility], [place] [address], [place], [place] [address]    RADIOGRAPHIC STUDIES: I have personally reviewed the radiological images as listed and  agree with the findings in the report  No results found.  ASSESSMENT/PLAN  72 y.o. female with medical history notable for thyroid nodule, hypothyroidism, morbid obesity, obstructive sleep apnea, bilateral hearing loss, hypertension, rheumatoid arthritis, chronic diarrhea, hypertension, gout.  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 Labs notable for  Coombs (IgG negative/ Complement positive) Haptoglobin < 10 Cold agglutinin titer greater than 1:4096  Bilirubin 3.3 Smear showed RBC clumping.  Hgb 10.2 Bone marrow bx showed expanded erythron and  monoclonal B cell population (1%) with kappa restriction.  Has been reported in patients with systemic rheumatic diseases   Therapeutics:  Patient had a high Cold agglutinin titer with symptomatic anemia October 14 2022 Cycle 1 Day 1 Rituxan 375 mg/m2 IV weekly x 4.   October 28 2022:  Cycle 1 Day 4 weekly Rituxan December 09 2022:  Hgb 11.7  Reticulocytes 1.6% Haptoglobin 74 LDH 255  Coombs remains positive   Excellent response to therapy.  Coombs negativity is not a treatment requirement.  Will continue to follow with periodic labs.  Can retreat with Rituxan if relapses   Positive RNP: May be related to history of RA.     Reminded patient of the importance of avoiding cold as this can exacerbate hemolysis.  Taking folic acid 1 mg daily to support RBC production  and allopurinol due to increased uric acid    Cancer Staging  No matching staging information was found for the patient.   No problem-specific Assessment & Plan notes found for this encounter.   No orders of the defined types were placed in this encounter.  23  minutes was spent in patient care.  This included time spent preparing to see the patient (e.g., review of tests), obtaining and/or reviewing separately obtained history, counseling and educating the  patient/family/caregiver, ordering tests, documenting clinical information in the electronic or other health record, independently interpreting results and communicating results to the patient/family/caregiver as well as coordination of care.      All questions were answered. The patient knows to call the clinic with any problems, questions or concerns.  This note was electronically signed.    Barbee Cough, MD  12/19/2022 11:25 AM

## 2022-12-10 ENCOUNTER — Other Ambulatory Visit: Payer: Self-pay

## 2022-12-11 LAB — HAPTOGLOBIN: Haptoglobin: 74 mg/dL (ref 37–355)

## 2022-12-13 ENCOUNTER — Telehealth: Payer: Self-pay | Admitting: Oncology

## 2022-12-13 NOTE — Telephone Encounter (Signed)
Spoke with patient confirming upcoming appointments  

## 2022-12-23 IMAGING — MG DIGITAL SCREENING BILAT W/ CAD
6 series · 6 of 6 positions shown · non-contrast
Comparison: Previous exam(s).

CLINICAL DATA: Screening.

EXAM:
DIGITAL SCREENING BILATERAL MAMMOGRAM WITH CAD
TECHNIQUE: Bilateral screening digital craniocaudal and mediolateral oblique
mammograms were obtained. The images were evaluated with
computer-aided detection.

[L CC (1 of 2)]
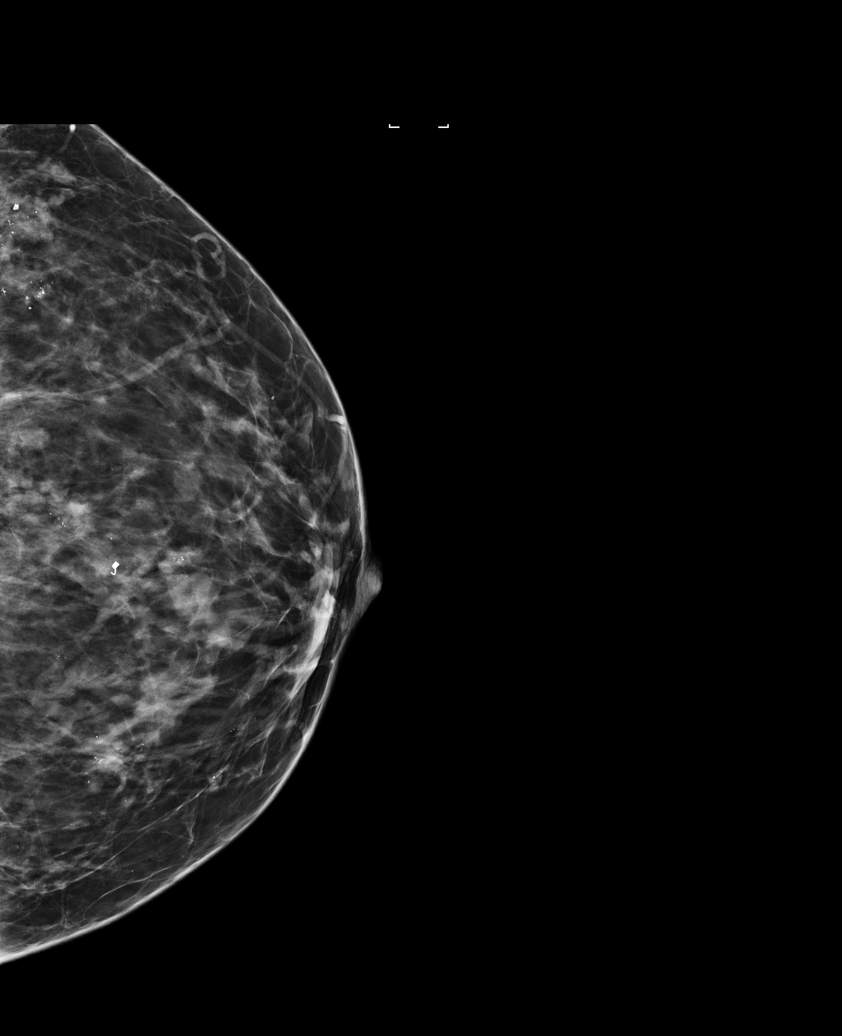

[L CC (2 of 2)]
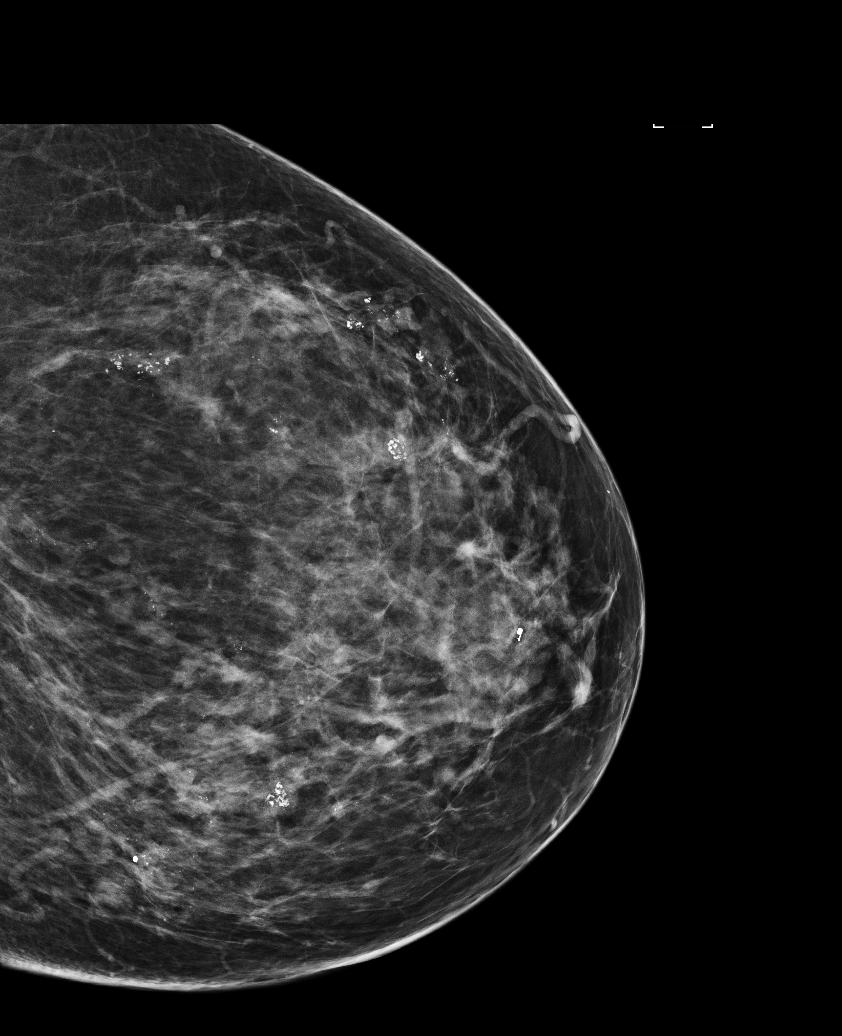

[R CC (1 of 2)]
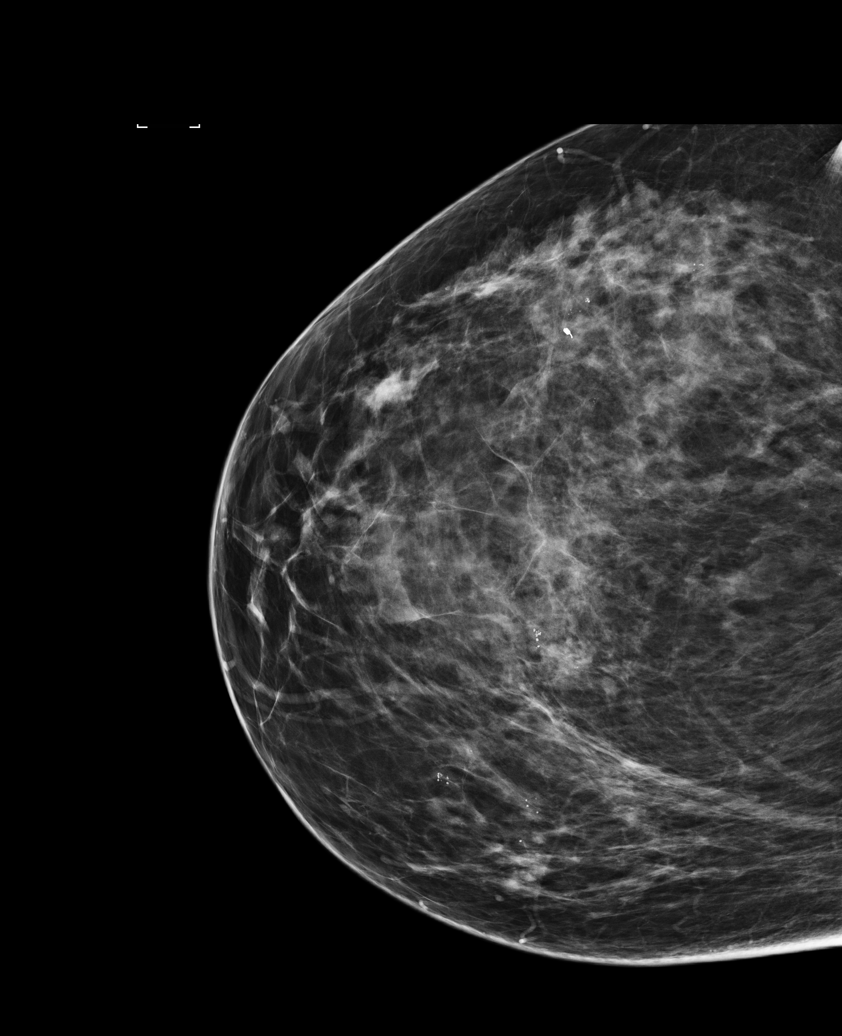

[R MLO]
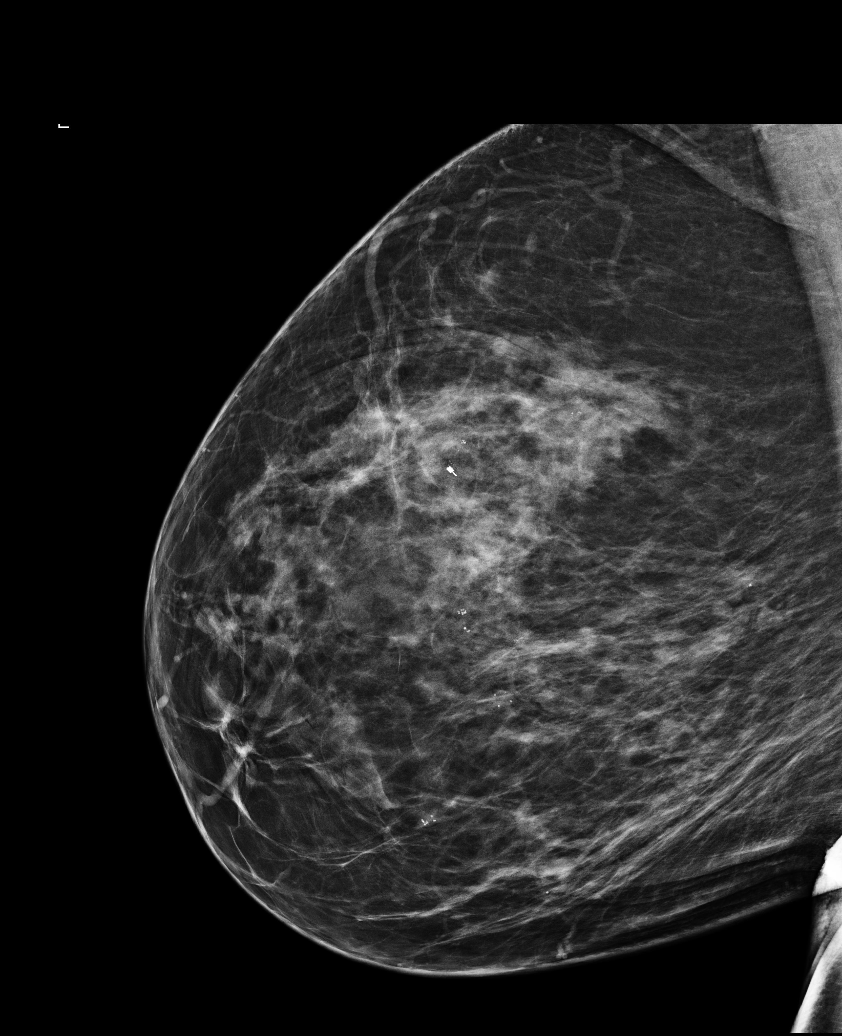

[R CC (2 of 2)]
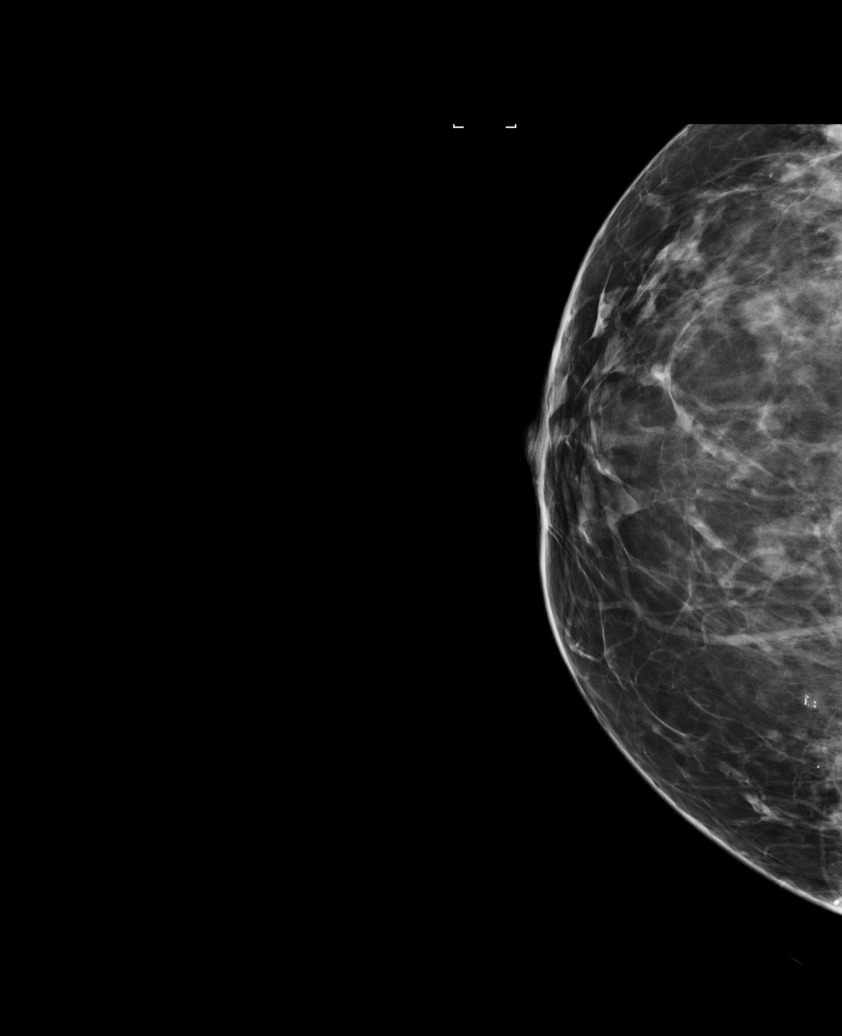

[L MLO]
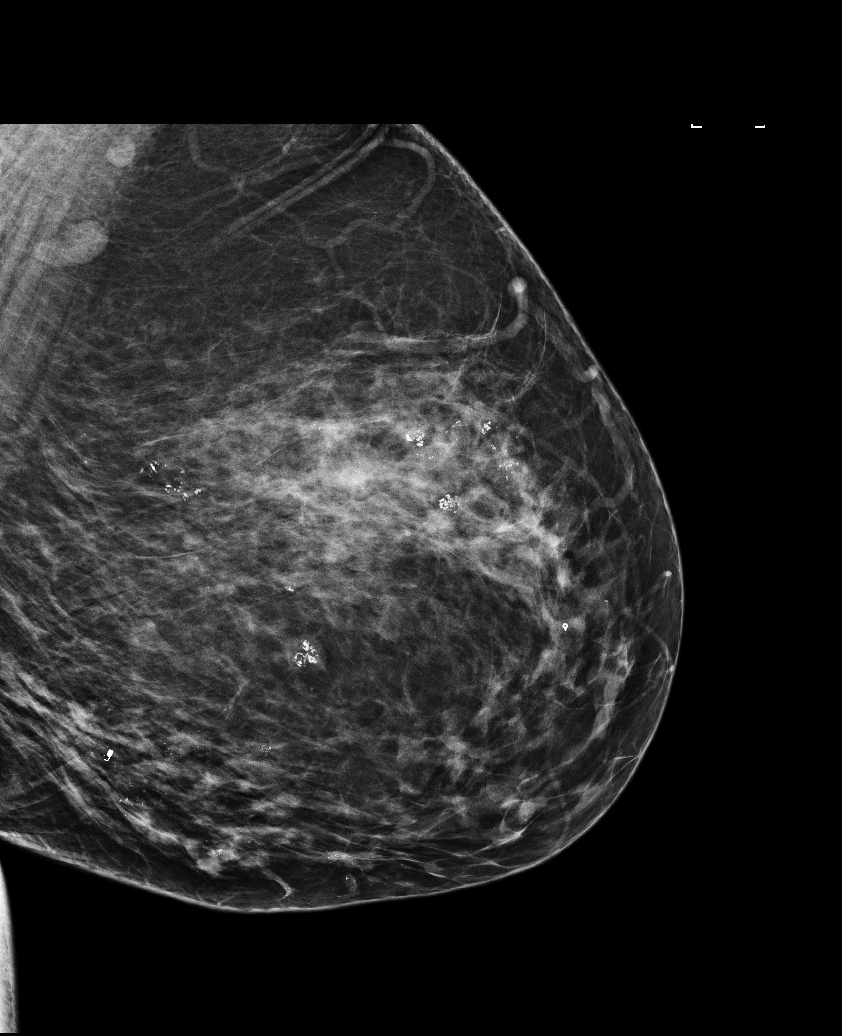

[6 of 6 positions shown; findings below may reference images not displayed]

ACR Breast Density Category c: The breast tissue is heterogeneously
dense, which may obscure small masses.
FINDINGS: There are no findings suspicious for malignancy.
IMPRESSION: No mammographic evidence of malignancy. A result letter of this
screening mammogram will be mailed directly to the patient.

RECOMMENDATION:
Screening mammogram in one year. (Code:TJ-B-ZPA)

BI-RADS CATEGORY  1: Negative.

## 2023-03-09 ENCOUNTER — Other Ambulatory Visit: Payer: Self-pay | Admitting: Oncology

## 2023-03-09 DIAGNOSIS — D479 Neoplasm of uncertain behavior of lymphoid, hematopoietic and related tissue, unspecified: Secondary | ICD-10-CM

## 2023-03-10 ENCOUNTER — Ambulatory Visit: Payer: Medicare PPO | Admitting: Oncology

## 2023-03-10 ENCOUNTER — Inpatient Hospital Stay (HOSPITAL_BASED_OUTPATIENT_CLINIC_OR_DEPARTMENT_OTHER): Payer: Medicare PPO | Admitting: Oncology

## 2023-03-10 ENCOUNTER — Other Ambulatory Visit: Payer: Self-pay

## 2023-03-10 ENCOUNTER — Other Ambulatory Visit: Payer: Medicare PPO

## 2023-03-10 ENCOUNTER — Inpatient Hospital Stay: Payer: Medicare PPO | Attending: Oncology

## 2023-03-10 VITALS — BP 128/58 | HR 77 | Temp 97.6°F | Resp 17 | Ht 62.0 in | Wt 258.5 lb

## 2023-03-10 DIAGNOSIS — M7989 Other specified soft tissue disorders: Secondary | ICD-10-CM | POA: Diagnosis not present

## 2023-03-10 DIAGNOSIS — C8580 Other specified types of non-Hodgkin lymphoma, unspecified site: Secondary | ICD-10-CM

## 2023-03-10 DIAGNOSIS — R768 Other specified abnormal immunological findings in serum: Secondary | ICD-10-CM

## 2023-03-10 DIAGNOSIS — M109 Gout, unspecified: Secondary | ICD-10-CM | POA: Insufficient documentation

## 2023-03-10 DIAGNOSIS — D5912 Cold autoimmune hemolytic anemia: Secondary | ICD-10-CM

## 2023-03-10 DIAGNOSIS — G4733 Obstructive sleep apnea (adult) (pediatric): Secondary | ICD-10-CM | POA: Insufficient documentation

## 2023-03-10 DIAGNOSIS — M069 Rheumatoid arthritis, unspecified: Secondary | ICD-10-CM | POA: Diagnosis not present

## 2023-03-10 DIAGNOSIS — E039 Hypothyroidism, unspecified: Secondary | ICD-10-CM | POA: Diagnosis not present

## 2023-03-10 DIAGNOSIS — Z8249 Family history of ischemic heart disease and other diseases of the circulatory system: Secondary | ICD-10-CM | POA: Insufficient documentation

## 2023-03-10 DIAGNOSIS — M791 Myalgia, unspecified site: Secondary | ICD-10-CM | POA: Diagnosis not present

## 2023-03-10 DIAGNOSIS — Z800 Family history of malignant neoplasm of digestive organs: Secondary | ICD-10-CM | POA: Insufficient documentation

## 2023-03-10 DIAGNOSIS — Z803 Family history of malignant neoplasm of breast: Secondary | ICD-10-CM | POA: Diagnosis not present

## 2023-03-10 DIAGNOSIS — Z90710 Acquired absence of both cervix and uterus: Secondary | ICD-10-CM | POA: Diagnosis not present

## 2023-03-10 DIAGNOSIS — Z820 Family history of epilepsy and other diseases of the nervous system: Secondary | ICD-10-CM | POA: Diagnosis not present

## 2023-03-10 DIAGNOSIS — H9193 Unspecified hearing loss, bilateral: Secondary | ICD-10-CM | POA: Diagnosis not present

## 2023-03-10 DIAGNOSIS — Z79890 Hormone replacement therapy (postmenopausal): Secondary | ICD-10-CM | POA: Insufficient documentation

## 2023-03-10 DIAGNOSIS — D479 Neoplasm of uncertain behavior of lymphoid, hematopoietic and related tissue, unspecified: Secondary | ICD-10-CM | POA: Diagnosis present

## 2023-03-10 DIAGNOSIS — R5383 Other fatigue: Secondary | ICD-10-CM | POA: Diagnosis not present

## 2023-03-10 DIAGNOSIS — Z823 Family history of stroke: Secondary | ICD-10-CM | POA: Diagnosis not present

## 2023-03-10 DIAGNOSIS — I10 Essential (primary) hypertension: Secondary | ICD-10-CM | POA: Insufficient documentation

## 2023-03-10 DIAGNOSIS — Z79899 Other long term (current) drug therapy: Secondary | ICD-10-CM | POA: Diagnosis not present

## 2023-03-10 DIAGNOSIS — M2550 Pain in unspecified joint: Secondary | ICD-10-CM | POA: Insufficient documentation

## 2023-03-10 LAB — CBC WITH DIFFERENTIAL (CANCER CENTER ONLY)
Abs Immature Granulocytes: 0.03 10*3/uL (ref 0.00–0.07)
Basophils Absolute: 0.1 10*3/uL (ref 0.0–0.1)
Basophils Relative: 1 %
Eosinophils Absolute: 0.5 10*3/uL (ref 0.0–0.5)
Eosinophils Relative: 8 %
HCT: 32.7 % — ABNORMAL LOW (ref 36.0–46.0)
Hemoglobin: 11.4 g/dL — ABNORMAL LOW (ref 12.0–15.0)
Immature Granulocytes: 1 %
Lymphocytes Relative: 32 %
Lymphs Abs: 2.1 10*3/uL (ref 0.7–4.0)
MCH: 32.9 pg (ref 26.0–34.0)
MCHC: 34.9 g/dL (ref 30.0–36.0)
MCV: 94.2 fL (ref 80.0–100.0)
Monocytes Absolute: 0.5 10*3/uL (ref 0.1–1.0)
Monocytes Relative: 8 %
Neutro Abs: 3.3 10*3/uL (ref 1.7–7.7)
Neutrophils Relative %: 50 %
Platelet Count: 318 10*3/uL (ref 150–400)
RBC: 3.47 MIL/uL — ABNORMAL LOW (ref 3.87–5.11)
RDW: 18 % — ABNORMAL HIGH (ref 11.5–15.5)
WBC Count: 6.6 10*3/uL (ref 4.0–10.5)
nRBC: 0 % (ref 0.0–0.2)

## 2023-03-10 LAB — CMP (CANCER CENTER ONLY)
ALT: 26 U/L (ref 0–44)
AST: 28 U/L (ref 15–41)
Albumin: 3.9 g/dL (ref 3.5–5.0)
Alkaline Phosphatase: 92 U/L (ref 38–126)
Anion gap: 6 (ref 5–15)
BUN: 18 mg/dL (ref 8–23)
CO2: 28 mmol/L (ref 22–32)
Calcium: 9.2 mg/dL (ref 8.9–10.3)
Chloride: 108 mmol/L (ref 98–111)
Creatinine: 0.75 mg/dL (ref 0.44–1.00)
GFR, Estimated: >60 mL/min (ref >60)
Glucose, Bld: 123 mg/dL — ABNORMAL HIGH (ref 70–99)
Potassium: 3.7 mmol/L (ref 3.5–5.1)
Sodium: 142 mmol/L (ref 135–145)
Total Bilirubin: 1.8 mg/dL — ABNORMAL HIGH (ref 0.3–1.2)
Total Protein: 6 g/dL — ABNORMAL LOW (ref 6.5–8.1)

## 2023-03-10 LAB — RETICULOCYTES
Immature Retic Fract: 10.1 % (ref 2.3–15.9)
RBC.: 3.8 MIL/uL — ABNORMAL LOW (ref 3.87–5.11)
Retic Count, Absolute: 81.3 10*3/uL (ref 19.0–186.0)
Retic Ct Pct: 2.1 % (ref 0.4–3.1)

## 2023-03-10 LAB — LACTATE DEHYDROGENASE: LDH: 210 U/L — ABNORMAL HIGH (ref 98–192)

## 2023-03-10 NOTE — Progress Notes (Signed)
[facility] Cancer Follow up Visit:  Patient Care Team: Har[place], MD as PCP - General (Internal Medicine) Loni Muse, MD as Attending Physician (Hematology)  CHIEF COMPLAINTS/PURPOSE OF CONSULTATION:  HISTORY OF PRESENTING ILLNESS: Becky Cox 72 y.o. female is here because of anemia Medical history notable for thyroid nodule, hypothyroidism, morbid obesity, obstructive sleep apnea, bilateral hearing loss, hypertension, rheumatoid arthritis, chronic diarrhea, hypertension, gout  March 22 2022:  Colonoscopy.  3 very small polyps  July 29, 2022: WBC 7.2 hemoglobin 10.3 MCV 93 platelet count 311; 45 segs 44 lymphs 5 monos 5 eos 1 basophil.  Specimen was prewarmed to 37 degrees to obtain results.  Technician suspected possibility of cold agglutinins/cryoglobulins present Chemistries notable for creatinine 0.79 albumin 3.9.  AST ALT and alk phos normal T. bili 2.3 direct bilirubin 0.6.  Reticulocyte count 2.8%  September 09 2022:  [facility] Hematology Consult Was told years ago that she was anemic.   Has never taken oral iron.  No history of IV iron or PRBC's.   Does not take ASA.  Mainly takes Tylenol for pain; rarely ibuprofen No pica to ice.  Uses CPAP every night  Social:  Divorced.  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  Tobacco none.  EtOH  [facility] Mother died 66 MI Father died 71 pneumonia Sister died 51 CVA, MS Sister alive 93 colon cancer Sister alive 85 Parkinson's disease Sister alive 32 HTN Sister alive 39 osteoarthritis Brother alive 50 TBI from motorcycle accident, HTN Brother died 24 CVA Brother died 1 colon cancer  WBC 9.6 hemoglobin 10.2 MCV 97 platelet count 346; 55 segs 35 lymphs 5 monos 4 eos 1 basophil.  Reticulocyte count 3.3% Coombs test complement positive/IgG negative.  Haptoglobin undetectable.  Cold agglutinin titer greater than 1:4096 Hemoglobin electrophoresis normal adult pattern. SPEP with IEP  showed no paraprotein. IgG 624 IgA 121 IgM 74 Serum free kappa 25.1 lambda 28.3 with a kappa lambda 0.89 B12 500 folate 27.3 ferritin 242 Copper 128 zinc 97 CMP notable for T. bili of 3.3 glucose 102  September 23, 2022: Underwent bone marrow biopsy and aspirate Pathology revealed A minor CD10 positive B-cell population(1% of the lymphocytes) with kappa excess. 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  Did not have a lot of discomfort following the bone marrow Reviewed results of labs and bone marrow results with patient.  Experiencing fatigue.  Having myalgias of legs.  Notes that her urine is dark  October 07 2022:  Rituxan 375 mg/m2  Hgb 8.8 October 14 2022  Rituxan 375 mg/m2.  Hgb 9.2  October 21 2022:  Rituxan 375 mg/m2  Hgb 10.1  October 28 2022:   Had a minor infusion rxn during first Rituxan infusion but was able to complete it and receive subsequent infusions.    December 09 2022:   Reviewed results of labs with patient and granddaughter.  Feels well.  Had COVID around Christmas for which she took Paxlovid.    WBC 5.5 hemoglobin 11.7 MCV 94.7 platelet count 289; 56 segs 32 lymphs 6 monos 5 eos 1 basophil reticulocyte count 1.6% Coombs positive for complement negative for IgG.  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  Feels well albeit a bit fatigued.  Reviewed results of labs with patient Hgb  11.4.  Cold agglutinin titer 1:1024 Haptoglobin 101  LDH 210   Review of Systems  Constitutional:  Negative for appetite change, chills, fatigue, fever and unexpected weight change.  HENT:   Negative for mouth sores, nosebleeds, sore throat, trouble swallowing and voice change.   Eyes:  Negative for eye problems and icterus.       Vision changes:  None   Respiratory:  Negative for chest tightness, cough, hemoptysis and wheezing.        Some DOE when does a lot of walking  Cardiovascular:  Positive for leg swelling. Negative for chest pain and palpitations.       PND:  none Orthopnea:  none  Gastrointestinal:  Negative for abdominal pain, blood in stool, constipation, diarrhea, nausea and vomiting.  Endocrine: Negative for hot flashes.       Cold intolerance:  none Heat intolerance:  none  Genitourinary:  Negative for bladder incontinence, difficulty urinating, dysuria, frequency, hematuria and nocturia.   Musculoskeletal:  Positive for arthralgias. Negative for back pain, gait problem, myalgias and neck pain.       Arthralgias of knees and ankles  Skin:  Negative for itching, rash and wound.  Neurological:  Negative for dizziness, extremity weakness, gait problem, headaches and numbness.  Hematological:  Negative for adenopathy. Does not bruise/bleed easily.  Psychiatric/Behavioral:  Negative for sleep disturbance and suicidal ideas. The patient is not nervous/anxious.     MEDICAL HISTORY: Past Medical History:  Diagnosis Date   Fibroid    Hypertension    Thyroid disease    Nodules    SURGICAL HISTORY: Past Surgical History:  Procedure Laterality Date   ABDOMINAL HYSTERECTOMY  1996   ANKLE SURGERY  04/2011   BONE SPURS IN RIGHT ANKLE   BREAST BIOPSY Left 2019   benign   BREAST BIOPSY Right 2021   FOOT SURGERY  2007 2008   KNEE SURGERY     Arthroscopic   MOLE REMOVAL  2009   BENIGN   THYROID CYST EXCISION     2001    SOCIAL HISTORY: Social History   Socioeconomic History   Marital status: Divorced    Spouse name: Not on file   Number of children: Not on file   Years of education: Not on file   Highest education level: Not on file  Occupational History   Not on file  Tobacco Use   Smoking status: Never   Smokeless tobacco: Never  Vaping Use   Vaping Use: Never used  Substance and Sexual Activity   Alcohol  use: No    Alcohol/week: 0.0 standard drinks of alcohol   Drug use: No   Sexual activity: Not Currently    Comment: INTERCOURSE AGE UNKNOWN , SEXUAL PARTNERS MOE THAN 6  Other Topics Concern   Not on file  Social History Narrative   Not on file   Social Determinants of Health   Financial Resource Strain: Not on file  Food Insecurity: Not on file  Transportation Needs: Not on file  Physical Activity: Not on file  Stress: Not on file  Social Connections: Not on file  Intimate Partner Violence: Not on file    FAMILY HISTORY Family History  Problem Relation Age of Onset   Breast cancer Sister 25   Hypertension Sister    Cancer Sister        COLON   Stroke Brother    Hypertension Brother    Breast cancer Maternal Grandmother     ALLERGIES:  is  allergic to rituximab.  MEDICATIONS:  Current Outpatient Medications  Medication Sig Dispense Refill   allopurinol (ZYLOPRIM) 300 MG tablet Take 1 tablet by mouth daily.     amLODipine (NORVASC) 5 MG tablet Take 10 tablets by mouth daily.      Ascorbic Acid (VITAMIN C PO) Take by mouth.     atorvastatin (LIPITOR) 10 MG tablet Take 1 tablet by mouth daily.     diclofenac Sodium (VOLTAREN) 1 % GEL Apply topically.     fluticasone (FLONASE) 50 MCG/ACT nasal spray Place 2 sprays into the nose daily.     folic acid (FOLVITE) 1 MG tablet Take 1 mg by mouth daily.     ipratropium (ATROVENT) 0.06 % nasal spray Place 2 sprays into both nostrils 4 (four) times daily. 15 mL 2   levothyroxine (SYNTHROID) 100 MCG tablet Take 100 mcg by mouth daily.     methotrexate (RHEUMATREX) 2.5 MG tablet take 6 tablets     montelukast (SINGULAIR) 10 MG tablet Take 1 tablet by mouth at bedtime.     Multiple Vitamin (MULTIVITAMIN PO) Take 1 tablet by mouth daily.     olmesartan (BENICAR) 20 MG tablet Take 20 mg by mouth daily.     triamterene-hydrochlorothiazide (MAXZIDE-25) 37.5-25 MG tablet Take 1 tablet by mouth daily.     No current  facility-administered medications for this visit.    PHYSICAL EXAMINATION:  ECOG PERFORMANCE STATUS: 1 - Symptomatic but completely ambulatory   Vitals:   03/10/23 1333  BP: (!) 128/58  Pulse: 77  Resp: 17  Temp: 97.6 F (36.4 C)  SpO2: 98%     Filed Weights   03/10/23 1333  Weight: 258 lb 8 oz (117.3 kg)      Physical Exam Vitals and nursing note reviewed.  Constitutional:      General: She is not in acute distress.    Appearance: Normal appearance. She is obese. She is not ill-appearing, toxic-appearing or diaphoretic.     Comments: Here alone.  Comfortable  HENT:     Head: Normocephalic and atraumatic.     Right Ear: External ear normal.     Left Ear: External ear normal.     Nose: Nose normal. No congestion or rhinorrhea.  Eyes:     General: No scleral icterus.    Extraocular Movements: Extraocular movements intact.     Conjunctiva/sclera: Conjunctivae normal.     Pupils: Pupils are equal, round, and reactive to light.  Cardiovascular:     Rate and Rhythm: Normal rate and regular rhythm.     Heart sounds: Normal heart sounds. No murmur heard.    No friction rub. No gallop.  Pulmonary:     Effort: Pulmonary effort is normal. No respiratory distress.     Breath sounds: No stridor. No wheezing, rhonchi or rales.  Abdominal:     General: Bowel sounds are normal. There is no distension.     Palpations: Abdomen is soft. There is no mass.     Tenderness: There is no abdominal tenderness. There is no guarding or rebound.     Hernia: No hernia is present.  Musculoskeletal:        General: No swelling, tenderness, deformity or signs of injury.     Cervical back: Normal range of motion and neck supple. No rigidity or tenderness.     Right lower leg: No edema.     Left lower leg: No edema.  Lymphadenopathy:     Head:     Right  side of head: No submental, submandibular, tonsillar, preauricular, posterior auricular or occipital adenopathy.     Left side of head: No  submental, submandibular, tonsillar, preauricular, posterior auricular or occipital adenopathy.     Cervical: No cervical adenopathy.     Right cervical: No superficial, deep or posterior cervical adenopathy.    Left cervical: No superficial, deep or posterior cervical adenopathy.     Upper Body:     Right upper body: No supraclavicular, axillary, pectoral or epitrochlear adenopathy.     Left upper body: No supraclavicular, axillary, pectoral or epitrochlear adenopathy.  Skin:    General: Skin is warm.     Coloration: Skin is not jaundiced or pale.     Findings: No bruising or erythema.  Neurological:     General: No focal deficit present.     Mental Status: She is alert and oriented to person, place, and time. Mental status is at baseline.     Cranial Nerves: No cranial nerve deficit.     Motor: No weakness.     Gait: Gait normal.  Psychiatric:        Mood and Affect: Mood normal.        Behavior: Behavior normal.        Thought Content: Thought content normal.        Judgment: Judgment normal.     LABORATORY DATA: I have personally reviewed the data as listed:  Appointment on 03/10/2023  Component Date Value Ref Range Status   Retic Ct Pct 03/10/2023 2.1  0.4 - 3.1 % Final   RBC. 03/10/2023 3.80 (L)  3.87 - 5.11 MIL/uL Final   Retic Count, Absolute 03/10/2023 81.3  19.0 - 186.0 K/uL Final   Immature Retic Fract 03/10/2023 10.1  2.3 - 15.9 % Final   Performed at [facility], [address], [place], [name] [address]   Sodium 03/10/2023 142  135 - 145 mmol/L Final   Potassium 03/10/2023 3.7  3.5 - 5.1 mmol/L Final   Chloride 03/10/2023 108  98 - 111 mmol/L Final   CO2 03/10/2023 28  22 - 32 mmol/L Final   Glucose, Bld 03/10/2023 123 (H)  70 - 99 mg/dL Final   Glucose reference range applies only to samples taken after fasting for at least 8 hours.   BUN 03/10/2023 18  8 - 23 mg/dL Final   Creatinine 96/29/5284 0.75  0.44 - 1.00 mg/dL Final    Calcium 13/24/4010 9.2  8.9 - 10.3 mg/dL Final   Total Protein 27/25/3664 6.0 (L)  6.5 - 8.1 g/dL Final   Albumin 40/34/7425 3.9  3.5 - 5.0 g/dL Final   AST 95/63/8756 28  15 - 41 U/L Final   ALT 03/10/2023 26  0 - 44 U/L Final   Alkaline Phosphatase 03/10/2023 92  38 - 126 U/L Final   Total Bilirubin 03/10/2023 1.8 (H)  0.3 - 1.2 mg/dL Final   GFR, Estimated 03/10/2023 >60  >60 mL/min Final   Comment: (NOTE) Calculated using the CKD-EPI Creatinine Equation (2021)    Anion gap 03/10/2023 6  5 - 15 Final   Performed at [facility], [address] [place], [name] [address]   WBC Count 03/10/2023 6.6  4.0 - 10.5 K/uL Final   RBC 03/10/2023 3.47 (L)  3.87 - 5.11 MIL/uL Final   Hemoglobin 03/10/2023 11.4 (L)  12.0 - 15.0 g/dL Final   HCT 51/88/4166 32.7 (L)  36.0 - 46.0 % Final  MCV 03/10/2023 94.2  80.0 - 100.0 fL Final   MCH 03/10/2023 32.9  26.0 - 34.0 pg Final   MCHC 03/10/2023 34.9  30.0 - 36.0 g/dL Final   RDW 29/56/2130 18.0 (H)  11.5 - 15.5 % Final   Platelet Count 03/10/2023 318  150 - 400 K/uL Final   nRBC 03/10/2023 0.0  0.0 - 0.2 % Final   Neutrophils Relative % 03/10/2023 50  % Final   Neutro Abs 03/10/2023 3.3  1.7 - 7.7 K/uL Final   Lymphocytes Relative 03/10/2023 32  % Final   Lymphs Abs 03/10/2023 2.1  0.7 - 4.0 K/uL Final   Monocytes Relative 03/10/2023 8  % Final   Monocytes Absolute 03/10/2023 0.5  0.1 - 1.0 K/uL Final   Eosinophils Relative 03/10/2023 8  % Final   Eosinophils Absolute 03/10/2023 0.5  0.0 - 0.5 K/uL Final   Basophils Relative 03/10/2023 1  % Final   Basophils Absolute 03/10/2023 0.1  0.0 - 0.1 K/uL Final   Immature Granulocytes 03/10/2023 1  % Final   Abs Immature Granulocytes 03/10/2023 0.03  0.00 - 0.07 K/uL Final   Performed at [facility], [address] [place], [name] [address]    RADIOGRAPHIC STUDIES: I have personally reviewed the radiological images as listed and agree with the  findings in the report  No results found.  ASSESSMENT/PLAN  72 y.o. female with medical history notable for thyroid nodule, hypothyroidism, morbid obesity, obstructive sleep apnea, bilateral hearing loss, hypertension, rheumatoid arthritis, chronic diarrhea, hypertension, gout.  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 Labs notable for  Coombs (IgG negative/ Complement positive) Haptoglobin < 10 Cold agglutinin titer greater than 1:4096  Bilirubin 3.3 Smear showed RBC clumping.  Hgb 10.2 Bone marrow bx showed expanded erythron and  monoclonal B cell population (1%) with kappa restriction.  Has been reported in patients with systemic rheumatic diseases   Therapeutics:  Patient had a high Cold agglutinin titer with symptomatic anemia October 14 2022 Cycle 1 Day 1 Rituxan 375 mg/m2 IV weekly x 4.   October 28 2022:  Cycle 1 Day 4 weekly Rituxan December 09 2022:  Hgb 11.7  Reticulocytes 1.6% Haptoglobin 74 LDH 255  Coombs remains positive March 10 2023- Hgb 11.4.  Cold agglutinin titer 1:1024 Haptoglobin 101  LDH 210.  Not having hemolysis clinically.  Cold agglutinin titer greatly improved but still positive.  Haptoglobin normal.    Excellent response to therapy.  Coombs negativity is not a treatment requirement.  Will continue to follow with periodic labs.  Can retreat with Rituxan if relapses   Positive RNP: May be related to history of RA.     Reminded patient of the importance of avoiding cold as this can exacerbate hemolysis.  Taking folic acid 1 mg daily to support RBC production  and allopurinol due to increased uric acid    Cancer Staging  No matching staging information was found for the patient.   No problem-specific Assessment & Plan notes found for this encounter.   No orders of the defined types were placed in this encounter.  20  minutes was spent in  patient care.  This included time spent preparing to see the patient (e.g., review of tests), obtaining and/or reviewing separately obtained history, counseling and educating the patient/family/caregiver, ordering tests, documenting clinical information in the electronic or other health record, independently interpreting  results and communicating results to the patient/family/caregiver as well as coordination of care.      All questions were answered. The patient knows to call the clinic with any problems, questions or concerns.  This note was electronically signed.    Loni Muse, MD  03/10/2023 2:00 PM

## 2023-03-13 LAB — HAPTOGLOBIN: Haptoglobin: 101 mg/dL (ref 37–355)

## 2023-03-13 LAB — COLD AGGLUTININ TITER: Cold Agglutinin Titer: 1:1024 {titer} — ABNORMAL HIGH

## 2023-03-14 ENCOUNTER — Telehealth: Payer: Self-pay | Admitting: Oncology

## 2023-04-27 ENCOUNTER — Other Ambulatory Visit (HOSPITAL_BASED_OUTPATIENT_CLINIC_OR_DEPARTMENT_OTHER): Payer: Self-pay

## 2023-04-27 MED ORDER — WEGOVY 0.25 MG/0.5ML ~~LOC~~ SOAJ
0.2500 mg | SUBCUTANEOUS | 0 refills | Status: DC
  Filled 2023-04-27: qty 2, 28d supply, fill #0

## 2023-04-28 ENCOUNTER — Other Ambulatory Visit (HOSPITAL_BASED_OUTPATIENT_CLINIC_OR_DEPARTMENT_OTHER): Payer: Self-pay

## 2023-05-04 ENCOUNTER — Other Ambulatory Visit: Payer: Self-pay

## 2023-05-04 ENCOUNTER — Other Ambulatory Visit: Payer: Self-pay | Admitting: Oncology

## 2023-05-04 DIAGNOSIS — D500 Iron deficiency anemia secondary to blood loss (chronic): Secondary | ICD-10-CM

## 2023-05-04 DIAGNOSIS — D5912 Cold autoimmune hemolytic anemia: Secondary | ICD-10-CM

## 2023-05-04 NOTE — Progress Notes (Signed)
[name] Cancer Follow up Visit:  Patient Care Team: Har[place], MD as PCP - General (Internal Medicine) Loni Muse, MD as Attending Physician (Hematology)  CHIEF COMPLAINTS/PURPOSE OF CONSULTATION:  HISTORY OF PRESENTING ILLNESS: Becky Cox 72 y.o. female is here because of anemia Medical history notable for thyroid nodule, hypothyroidism, morbid obesity, obstructive sleep apnea, bilateral hearing loss, hypertension, rheumatoid arthritis, chronic diarrhea, hypertension, gout  March 22 2022:  Colonoscopy.  3 very small polyps  July 29, 2022: WBC 7.2 hemoglobin 10.3 MCV 93 platelet count 311; 45 segs 44 lymphs 5 monos 5 eos 1 basophil.  Specimen was prewarmed to 37 degrees to obtain results.  Technician suspected possibility of cold agglutinins/cryoglobulins present Chemistries notable for creatinine 0.79 albumin 3.9.  AST ALT and alk phos normal T. bili 2.3 direct bilirubin 0.6.  Reticulocyte count 2.8%  September 09 2022:  [facility] Hematology Consult Was told years ago that she was anemic.   Has never taken oral iron.  No history of IV iron or PRBC's.   Does not take ASA.  Mainly takes Tylenol for pain; rarely ibuprofen No pica to ice.  Uses CPAP every night  Social:  Divorced.  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  Tobacco none.  EtOH  [facility] Mother died 29 MI Father died 47 pneumonia Sister died 53 CVA, MS Sister alive 54 colon cancer Sister alive 68 Parkinson's disease Sister alive 82 HTN Sister alive 5 osteoarthritis Brother alive 71 TBI from motorcycle accident, HTN Brother died 5 CVA Brother died 18 colon cancer  WBC 9.6 hemoglobin 10.2 MCV 97 platelet count 346; 55 segs 35 lymphs 5 monos 4 eos 1 basophil.  Reticulocyte count 3.3% Coombs test complement positive/IgG negative.  Haptoglobin undetectable.  Cold agglutinin titer greater than 1:4096 Hemoglobin electrophoresis normal adult pattern. SPEP with IEP  showed no paraprotein. IgG 624 IgA 121 IgM 74 Serum free kappa 25.1 lambda 28.3 with a kappa lambda 0.89 B12 500 folate 27.3 ferritin 242 Copper 128 zinc 97 CMP notable for T. bili of 3.3 glucose 102  September 23, 2022: Underwent bone marrow biopsy and aspirate Pathology revealed A minor CD10 positive B-cell population(1% of the lymphocytes) with kappa excess. 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  Did not have a lot of discomfort following the bone marrow Reviewed results of labs and bone marrow results with patient.  Experiencing fatigue.  Having myalgias of legs.  Notes that her urine is dark  October 07 2022:  Rituxan 375 mg/m2  Hgb 8.8 October 14 2022  Rituxan 375 mg/m2.  Hgb 9.2  October 21 2022:  Rituxan 375 mg/m2  Hgb 10.1  October 28 2022:   Had a minor infusion rxn during first Rituxan infusion but was able to complete it and receive subsequent infusions.    December 09 2022:   Reviewed results of labs with patient and granddaughter.  Feels well.  Had COVID around Christmas for which she took Paxlovid.    WBC 5.5 hemoglobin 11.7 MCV 94.7 platelet count 289; 56 segs 32 lymphs 6 monos 5 eos 1 basophil reticulocyte count 1.6% Coombs positive for complement negative for IgG.  Haptoglobin 74 LDH 255 CMP notable for total protein 5.9  March 10 2023:   Feels well albeit a bit fatigued.  Reviewed results of labs with patient Hgb 11.4.  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   To retire at the end of the month but has plans to keep busy.  Reviewed results of labs with patient.  Feels well Hgb 11.4 Retic 2.3 Tbili 2.1 LDH 217   Review of Systems  Constitutional:  Negative for appetite change, chills, fatigue, fever and unexpected weight change.  HENT:    Negative for mouth sores, nosebleeds, sore throat, trouble swallowing and voice change.   Eyes:  Negative for eye problems and icterus.       Vision changes:  None  Respiratory:  Negative for chest tightness, cough, hemoptysis and wheezing.        Some DOE when does a lot of walking  Cardiovascular:  Positive for leg swelling. Negative for chest pain and palpitations.       PND:  none Orthopnea:  none  Gastrointestinal:  Negative for abdominal pain, blood in stool, constipation, diarrhea, nausea and vomiting.  Endocrine: Negative for hot flashes.       Cold intolerance:  none Heat intolerance:  none  Genitourinary:  Negative for bladder incontinence, difficulty urinating, dysuria, frequency, hematuria and nocturia.   Musculoskeletal:  Positive for arthralgias. Negative for back pain, gait problem, myalgias and neck pain.       Arthralgias of knees and ankles  Skin:  Negative for itching, rash and wound.  Neurological:  Negative for dizziness, extremity weakness, gait problem, headaches and numbness.  Hematological:  Negative for adenopathy. Does not bruise/bleed easily.  Psychiatric/Behavioral:  Negative for sleep disturbance and suicidal ideas. The patient is not nervous/anxious.     MEDICAL HISTORY: Past Medical History:  Diagnosis Date   Fibroid    Hypertension    Thyroid disease    Nodules    SURGICAL HISTORY: Past Surgical History:  Procedure Laterality Date   ABDOMINAL HYSTERECTOMY  1996   ANKLE SURGERY  04/2011   BONE SPURS IN RIGHT ANKLE   BREAST BIOPSY Left 2019   benign   BREAST BIOPSY Right 2021   FOOT SURGERY  2007 2008   KNEE SURGERY     Arthroscopic   MOLE REMOVAL  2009   BENIGN   THYROID CYST EXCISION     2001    SOCIAL HISTORY: Social History   Socioeconomic History   Marital status: Divorced    Spouse name: Not on file   Number of children: Not on file   Years of education: Not on file   Highest education level: Not on file  Occupational  History   Not on file  Tobacco Use   Smoking status: Never   Smokeless tobacco: Never  Vaping Use   Vaping Use: Never used  Substance and Sexual Activity   Alcohol use: No    Alcohol/week: 0.0 standard drinks of alcohol   Drug use: No   Sexual activity: Not Currently    Comment: INTERCOURSE AGE UNKNOWN , SEXUAL PARTNERS MOE THAN 6  Other Topics Concern   Not on file  Social History Narrative   Not on file   Social Determinants of Health   Financial Resource Strain: Not on file  Food Insecurity: Not on file  Transportation Needs: Not on file  Physical Activity: Not on file  Stress: Not on file  Social Connections: Not on file  Intimate Partner Violence: Not on file    FAMILY HISTORY Family History  Problem Relation Age of Onset   Breast cancer Sister 77  Hypertension Sister    Cancer Sister        COLON   Stroke Brother    Hypertension Brother    Breast cancer Maternal Grandmother     ALLERGIES:  is allergic to rituximab.  MEDICATIONS:  Current Outpatient Medications  Medication Sig Dispense Refill   allopurinol (ZYLOPRIM) 300 MG tablet Take 1 tablet by mouth daily.     amLODipine (NORVASC) 5 MG tablet Take 10 tablets by mouth daily.      Ascorbic Acid (VITAMIN C PO) Take by mouth.     atorvastatin (LIPITOR) 10 MG tablet Take 1 tablet by mouth daily.     diclofenac Sodium (VOLTAREN) 1 % GEL Apply topically.     fluticasone (FLONASE) 50 MCG/ACT nasal spray Place 2 sprays into the nose daily.     folic acid (FOLVITE) 1 MG tablet Take 1 mg by mouth daily.     ipratropium (ATROVENT) 0.06 % nasal spray Place 2 sprays into both nostrils 4 (four) times daily. 15 mL 2   levothyroxine (SYNTHROID) 100 MCG tablet Take 100 mcg by mouth daily.     methotrexate (RHEUMATREX) 2.5 MG tablet take 6 tablets     montelukast (SINGULAIR) 10 MG tablet Take 1 tablet by mouth at bedtime.     Multiple Vitamin (MULTIVITAMIN PO) Take 1 tablet by mouth daily.     olmesartan (BENICAR) 20  MG tablet Take 20 mg by mouth daily.     Semaglutide-Weight Management (WEGOVY) 0.25 MG/0.5ML SOAJ Inject 0.25 mg into the skin once a week. 2 mL 0   triamterene-hydrochlorothiazide (MAXZIDE-25) 37.5-25 MG tablet Take 1 tablet by mouth daily.     No current facility-administered medications for this visit.    PHYSICAL EXAMINATION:  ECOG PERFORMANCE STATUS: 1 - Symptomatic but completely ambulatory   There were no vitals filed for this visit.    There were no vitals filed for this visit.     Physical Exam Vitals and nursing note reviewed.  Constitutional:      General: She is not in acute distress.    Appearance: Normal appearance. She is obese. She is not ill-appearing, toxic-appearing or diaphoretic.     Comments: Here alone.  Comfortable  HENT:     Head: Normocephalic and atraumatic.     Right Ear: External ear normal.     Left Ear: External ear normal.     Nose: Nose normal. No congestion or rhinorrhea.  Eyes:     General: No scleral icterus.    Extraocular Movements: Extraocular movements intact.     Conjunctiva/sclera: Conjunctivae normal.     Pupils: Pupils are equal, round, and reactive to light.  Cardiovascular:     Rate and Rhythm: Normal rate and regular rhythm.     Heart sounds: Normal heart sounds. No murmur heard.    No friction rub. No gallop.  Pulmonary:     Effort: Pulmonary effort is normal. No respiratory distress.     Breath sounds: No stridor. No wheezing, rhonchi or rales.  Abdominal:     General: Bowel sounds are normal. There is no distension.     Palpations: Abdomen is soft. There is no mass.     Tenderness: There is no abdominal tenderness. There is no guarding or rebound.     Hernia: No hernia is present.  Musculoskeletal:        General: No swelling, tenderness, deformity or signs of injury.     Cervical back: Normal range of motion and neck supple.  No rigidity or tenderness.     Right lower leg: No edema.     Left lower leg: No edema.   Lymphadenopathy:     Head:     Right side of head: No submental, submandibular, tonsillar, preauricular, posterior auricular or occipital adenopathy.     Left side of head: No submental, submandibular, tonsillar, preauricular, posterior auricular or occipital adenopathy.     Cervical: No cervical adenopathy.     Right cervical: No superficial, deep or posterior cervical adenopathy.    Left cervical: No superficial, deep or posterior cervical adenopathy.     Upper Body:     Right upper body: No supraclavicular, axillary, pectoral or epitrochlear adenopathy.     Left upper body: No supraclavicular, axillary, pectoral or epitrochlear adenopathy.  Skin:    General: Skin is warm.     Coloration: Skin is not jaundiced or pale.     Findings: No bruising or erythema.  Neurological:     General: No focal deficit present.     Mental Status: She is alert and oriented to person, place, and time. Mental status is at baseline.     Cranial Nerves: No cranial nerve deficit.     Motor: No weakness.     Gait: Gait normal.  Psychiatric:        Mood and Affect: Mood normal.        Behavior: Behavior normal.        Thought Content: Thought content normal.        Judgment: Judgment normal.    LABORATORY DATA: I have personally reviewed the data as listed:  No visits with results within 1 Month(s) from this visit.  Latest known visit with results is:  Appointment on 03/10/2023  Component Date Value Ref Range Status   Cold Agglutinin Titer 03/10/2023 1:1024 (H)  Neg <1:32 Final   Comment: (NOTE) Performed At: [facility] [address] [place], [name] [id number] Jolene Schimke MD WU:[id number]    Retic Ct Pct 03/10/2023 2.1  0.4 - 3.1 % Final   RBC. 03/10/2023 3.80 (L)  3.87 - 5.11 MIL/uL Final   Retic Count, Absolute 03/10/2023 81.3  19.0 - 186.0 K/uL Final   Immature Retic Fract 03/10/2023 10.1  2.3 - 15.9 % Final   Performed at [facility], [address] [place], [name] [address]   Haptoglobin 03/10/2023 101  37 - 355 mg/dL Final   Comment: (NOTE) Performed At: [facility] [address] [place], [name] [id number] Jolene Schimke MD [id number]    LDH 03/10/2023 210 (H)  98 - 192 U/L Final   Performed at [facility], [address], [place], [name] [address]   Sodium 03/10/2023 142  135 - 145 mmol/L Final   Potassium 03/10/2023 3.7  3.5 - 5.1 mmol/L Final   Chloride 03/10/2023 108  98 - 111 mmol/L Final   CO2 03/10/2023 28  22 - 32 mmol/L Final   Glucose, Bld 03/10/2023 123 (H)  70 - 99 mg/dL Final   Glucose reference range applies only to samples taken after fasting for at least 8 hours.   BUN 03/10/2023 18  8 - 23 mg/dL Final   Creatinine 72/53/6644 0.75  0.44 - 1.00 mg/dL Final   Calcium 03/47/4259 9.2  8.9 - 10.3 mg/dL Final   Total Protein 56/38/7564 6.0 (L)  6.5 - 8.1 g/dL Final   Albumin 33/29/5188 3.9  3.5 - 5.0 g/dL Final   AST 41/66/0630  28  15 - 41 U/L Final   ALT 03/10/2023 26  0 - 44 U/L Final   Alkaline Phosphatase 03/10/2023 92  38 - 126 U/L Final   Total Bilirubin 03/10/2023 1.8 (H)  0.3 - 1.2 mg/dL Final   GFR, Estimated 03/10/2023 >60  >60 mL/min Final   Comment: (NOTE) Calculated using the CKD-EPI Creatinine Equation (2021)    Anion gap 03/10/2023 6  5 - 15 Final   Performed at [facility], [address], [place], [name] [address]   WBC Count 03/10/2023 6.6  4.0 - 10.5 K/uL Final   RBC 03/10/2023 3.47 (L)  3.87 - 5.11 MIL/uL Final   Hemoglobin 03/10/2023 11.4 (L)  12.0 - 15.0 g/dL Final   HCT 60/45/4098 32.7 (L)  36.0 - 46.0 % Final   MCV 03/10/2023 94.2  80.0 - 100.0 fL Final   MCH 03/10/2023 32.9  26.0 - 34.0 pg Final   MCHC 03/10/2023 34.9  30.0 - 36.0 g/dL Final   RDW 11/91/4782 18.0 (H)  11.5 - 15.5 % Final   Platelet Count 03/10/2023 318  150 - 400 K/uL Final   nRBC 03/10/2023 0.0  0.0 - 0.2 % Final   Neutrophils Relative % 03/10/2023  50  % Final   Neutro Abs 03/10/2023 3.3  1.7 - 7.7 K/uL Final   Lymphocytes Relative 03/10/2023 32  % Final   Lymphs Abs 03/10/2023 2.1  0.7 - 4.0 K/uL Final   Monocytes Relative 03/10/2023 8  % Final   Monocytes Absolute 03/10/2023 0.5  0.1 - 1.0 K/uL Final   Eosinophils Relative 03/10/2023 8  % Final   Eosinophils Absolute 03/10/2023 0.5  0.0 - 0.5 K/uL Final   Basophils Relative 03/10/2023 1  % Final   Basophils Absolute 03/10/2023 0.1  0.0 - 0.1 K/uL Final   Immature Granulocytes 03/10/2023 1  % Final   Abs Immature Granulocytes 03/10/2023 0.03  0.00 - 0.07 K/uL Final   Performed at [facility], [address] [place], [name] [address]    RADIOGRAPHIC STUDIES: I have personally reviewed the radiological images as listed and agree with the findings in the report  No results found.  ASSESSMENT/PLAN  72 y.o. female with medical history notable for thyroid nodule, hypothyroidism, morbid obesity, obstructive sleep apnea, bilateral hearing loss, hypertension, rheumatoid arthritis, chronic diarrhea, hypertension, gout.  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 Labs notable for  Coombs (IgG negative/ Complement positive) Haptoglobin < 10 Cold agglutinin titer greater than 1:4096  Bilirubin 3.3 Smear showed RBC clumping.  Hgb 10.2 Bone marrow bx showed expanded erythron and  monoclonal B cell population (1%) with kappa restriction.  Has been reported in patients with systemic rheumatic diseases   Therapeutics:  Patient had a high Cold agglutinin titer with symptomatic anemia October 14 2022 Cycle 1 Day 1 Rituxan 375 mg/m2 IV weekly x 4.   October 28 2022:  Cycle 1 Day 4 weekly Rituxan December 09 2022:  Hgb 11.7  Reticulocytes 1.6% Haptoglobin 74 LDH 255  Coombs remains positive March 10 2023- Hgb 11.4.  Cold agglutinin titer 1:1024 Haptoglobin  101  LDH 210.  Not having hemolysis clinically.  Cold agglutinin titer greatly improved but still positive.  Haptoglobin normal.    Excellent response to therapy.    May 05 2023-- Hgb 11.4 Retic 2.3 Tbili 2.1 LDH 217  Hgb stable.  Retic and tbili increasing.  Awaiting results of Haptoglobin and Cold agglutinin.  Will likely need  retreatment with Rituxan before end of 2024.   Coombs negativity is not a treatment requirement.  Will continue to follow with periodic labs.  Can retreat with Rituxan if relapses   Positive RNP: May be related to history of RA.     Reminded patient of the importance of avoiding cold as this can exacerbate hemolysis.  Taking folic acid 1 mg daily to support RBC production  and allopurinol due to increased uric acid    Cancer Staging  No matching staging information was found for the patient.   No problem-specific Assessment & Plan notes found for this encounter.   No orders of the defined types were placed in this encounter.  30  minutes was spent in patient care.  This included time spent preparing to see the patient (e.g., review of tests), obtaining and/or reviewing separately obtained history, counseling and educating the patient/family/caregiver, ordering tests, documenting clinical information in the electronic or other health record, independently interpreting results and communicating results to the patient/family/caregiver as well as coordination of care.      All questions were answered. The patient knows to call the clinic with any problems, questions or concerns.  This note was electronically signed.    Loni Muse, MD  05/04/2023 8:32 AM

## 2023-05-05 ENCOUNTER — Other Ambulatory Visit: Payer: Self-pay

## 2023-05-05 ENCOUNTER — Inpatient Hospital Stay: Payer: Medicare PPO | Admitting: Oncology

## 2023-05-05 ENCOUNTER — Inpatient Hospital Stay: Payer: Medicare PPO | Attending: Oncology

## 2023-05-05 VITALS — BP 131/61 | HR 86 | Temp 98.3°F | Resp 16 | Wt 253.1 lb

## 2023-05-05 DIAGNOSIS — M069 Rheumatoid arthritis, unspecified: Secondary | ICD-10-CM | POA: Insufficient documentation

## 2023-05-05 DIAGNOSIS — E039 Hypothyroidism, unspecified: Secondary | ICD-10-CM | POA: Insufficient documentation

## 2023-05-05 DIAGNOSIS — D500 Iron deficiency anemia secondary to blood loss (chronic): Secondary | ICD-10-CM

## 2023-05-05 DIAGNOSIS — R768 Other specified abnormal immunological findings in serum: Secondary | ICD-10-CM | POA: Diagnosis not present

## 2023-05-05 DIAGNOSIS — I10 Essential (primary) hypertension: Secondary | ICD-10-CM | POA: Insufficient documentation

## 2023-05-05 DIAGNOSIS — D5912 Cold autoimmune hemolytic anemia: Secondary | ICD-10-CM | POA: Diagnosis not present

## 2023-05-05 DIAGNOSIS — Z79899 Other long term (current) drug therapy: Secondary | ICD-10-CM | POA: Diagnosis not present

## 2023-05-05 DIAGNOSIS — H9193 Unspecified hearing loss, bilateral: Secondary | ICD-10-CM | POA: Insufficient documentation

## 2023-05-05 DIAGNOSIS — M109 Gout, unspecified: Secondary | ICD-10-CM | POA: Insufficient documentation

## 2023-05-05 DIAGNOSIS — Z803 Family history of malignant neoplasm of breast: Secondary | ICD-10-CM | POA: Diagnosis not present

## 2023-05-05 DIAGNOSIS — Z800 Family history of malignant neoplasm of digestive organs: Secondary | ICD-10-CM | POA: Insufficient documentation

## 2023-05-05 DIAGNOSIS — D479 Neoplasm of uncertain behavior of lymphoid, hematopoietic and related tissue, unspecified: Secondary | ICD-10-CM

## 2023-05-05 DIAGNOSIS — G4733 Obstructive sleep apnea (adult) (pediatric): Secondary | ICD-10-CM | POA: Diagnosis not present

## 2023-05-05 DIAGNOSIS — C8580 Other specified types of non-Hodgkin lymphoma, unspecified site: Secondary | ICD-10-CM | POA: Diagnosis not present

## 2023-05-05 LAB — CMP (CANCER CENTER ONLY)
ALT: 27 U/L (ref 0–44)
AST: 28 U/L (ref 15–41)
Albumin: 4.1 g/dL (ref 3.5–5.0)
Alkaline Phosphatase: 98 U/L (ref 38–126)
Anion gap: 7 (ref 5–15)
BUN: 19 mg/dL (ref 8–23)
CO2: 29 mmol/L (ref 22–32)
Calcium: 9.4 mg/dL (ref 8.9–10.3)
Chloride: 105 mmol/L (ref 98–111)
Creatinine: 0.82 mg/dL (ref 0.44–1.00)
GFR, Estimated: >60 mL/min (ref >60)
Glucose, Bld: 96 mg/dL (ref 70–99)
Potassium: 3.5 mmol/L (ref 3.5–5.1)
Sodium: 141 mmol/L (ref 135–145)
Total Bilirubin: 2.1 mg/dL — ABNORMAL HIGH (ref 0.3–1.2)
Total Protein: 6.5 g/dL (ref 6.5–8.1)

## 2023-05-05 LAB — CBC WITH DIFFERENTIAL (CANCER CENTER ONLY)
Abs Immature Granulocytes: 0.01 10*3/uL (ref 0.00–0.07)
Basophils Absolute: 0 10*3/uL (ref 0.0–0.1)
Basophils Relative: 1 %
Eosinophils Absolute: 0.4 10*3/uL (ref 0.0–0.5)
Eosinophils Relative: 6 %
HCT: 35.5 % — ABNORMAL LOW (ref 36.0–46.0)
Hemoglobin: 11.4 g/dL — ABNORMAL LOW (ref 12.0–15.0)
Immature Granulocytes: 0 %
Lymphocytes Relative: 35 %
Lymphs Abs: 2.3 10*3/uL (ref 0.7–4.0)
MCH: 29.5 pg (ref 26.0–34.0)
MCHC: 32.1 g/dL (ref 30.0–36.0)
MCV: 91.7 fL (ref 80.0–100.0)
Monocytes Absolute: 0.4 10*3/uL (ref 0.1–1.0)
Monocytes Relative: 6 %
Neutro Abs: 3.4 10*3/uL (ref 1.7–7.7)
Neutrophils Relative %: 52 %
Platelet Count: 318 10*3/uL (ref 150–400)
RBC: 3.87 MIL/uL (ref 3.87–5.11)
RDW: 14.8 % (ref 11.5–15.5)
WBC Count: 6.6 10*3/uL (ref 4.0–10.5)
nRBC: 0 % (ref 0.0–0.2)

## 2023-05-05 LAB — LACTATE DEHYDROGENASE: LDH: 217 U/L — ABNORMAL HIGH (ref 98–192)

## 2023-05-05 LAB — RETICULOCYTES
Immature Retic Fract: 10.5 % (ref 2.3–15.9)
RBC.: 3.86 MIL/uL — ABNORMAL LOW (ref 3.87–5.11)
Retic Count, Absolute: 90.3 10*3/uL (ref 19.0–186.0)
Retic Ct Pct: 2.3 % (ref 0.4–3.1)

## 2023-05-07 LAB — HAPTOGLOBIN: Haptoglobin: 86 mg/dL (ref 37–355)

## 2023-05-08 ENCOUNTER — Telehealth: Payer: Self-pay | Admitting: Oncology

## 2023-05-08 LAB — COLD AGGLUTININ TITER: Cold Agglutinin Titer: 1:512 {titer} — ABNORMAL HIGH

## 2023-05-08 NOTE — Telephone Encounter (Signed)
Spoke with patient confirming upcoming appointments  

## 2023-05-22 ENCOUNTER — Other Ambulatory Visit (HOSPITAL_BASED_OUTPATIENT_CLINIC_OR_DEPARTMENT_OTHER): Payer: Self-pay

## 2023-05-25 ENCOUNTER — Other Ambulatory Visit (HOSPITAL_BASED_OUTPATIENT_CLINIC_OR_DEPARTMENT_OTHER): Payer: Self-pay

## 2023-05-25 MED ORDER — WEGOVY 0.25 MG/0.5ML ~~LOC~~ SOAJ
0.2500 mg | SUBCUTANEOUS | 0 refills | Status: DC
  Filled 2023-05-25: qty 2, 28d supply, fill #0

## 2023-05-27 ENCOUNTER — Other Ambulatory Visit (HOSPITAL_BASED_OUTPATIENT_CLINIC_OR_DEPARTMENT_OTHER): Payer: Self-pay

## 2023-06-08 ENCOUNTER — Other Ambulatory Visit (HOSPITAL_BASED_OUTPATIENT_CLINIC_OR_DEPARTMENT_OTHER): Payer: Self-pay

## 2023-06-08 MED ORDER — WEGOVY 0.25 MG/0.5ML ~~LOC~~ SOAJ
0.2500 mg | SUBCUTANEOUS | 0 refills | Status: DC
  Filled 2023-06-08 – 2023-06-09 (×2): qty 2, 28d supply, fill #0

## 2023-06-09 ENCOUNTER — Other Ambulatory Visit: Payer: Self-pay | Admitting: Internal Medicine

## 2023-06-09 ENCOUNTER — Other Ambulatory Visit (HOSPITAL_BASED_OUTPATIENT_CLINIC_OR_DEPARTMENT_OTHER): Payer: Self-pay

## 2023-06-09 DIAGNOSIS — Z1231 Encounter for screening mammogram for malignant neoplasm of breast: Secondary | ICD-10-CM

## 2023-06-12 ENCOUNTER — Other Ambulatory Visit (HOSPITAL_BASED_OUTPATIENT_CLINIC_OR_DEPARTMENT_OTHER): Payer: Self-pay

## 2023-06-12 DIAGNOSIS — L918 Other hypertrophic disorders of the skin: Secondary | ICD-10-CM | POA: Insufficient documentation

## 2023-06-13 ENCOUNTER — Other Ambulatory Visit (HOSPITAL_BASED_OUTPATIENT_CLINIC_OR_DEPARTMENT_OTHER): Payer: Self-pay

## 2023-06-17 ENCOUNTER — Other Ambulatory Visit (HOSPITAL_BASED_OUTPATIENT_CLINIC_OR_DEPARTMENT_OTHER): Payer: Self-pay

## 2023-06-19 ENCOUNTER — Other Ambulatory Visit (HOSPITAL_BASED_OUTPATIENT_CLINIC_OR_DEPARTMENT_OTHER): Payer: Self-pay

## 2023-06-23 ENCOUNTER — Other Ambulatory Visit (HOSPITAL_BASED_OUTPATIENT_CLINIC_OR_DEPARTMENT_OTHER): Payer: Self-pay

## 2023-06-23 MED ORDER — WEGOVY 0.5 MG/0.5ML ~~LOC~~ SOAJ
0.5000 mg | SUBCUTANEOUS | 0 refills | Status: DC
  Filled 2023-06-23: qty 2, 28d supply, fill #0

## 2023-06-30 ENCOUNTER — Ambulatory Visit
Admission: RE | Admit: 2023-06-30 | Discharge: 2023-06-30 | Disposition: A | Discharge status: Home / Self Care | Payer: Medicare PPO | Source: Ambulatory Visit | Attending: Internal Medicine | Admitting: Internal Medicine

## 2023-06-30 DIAGNOSIS — Z1231 Encounter for screening mammogram for malignant neoplasm of breast: Secondary | ICD-10-CM

## 2023-07-06 ENCOUNTER — Other Ambulatory Visit: Payer: Self-pay | Admitting: Oncology

## 2023-07-06 DIAGNOSIS — C8580 Other specified types of non-Hodgkin lymphoma, unspecified site: Secondary | ICD-10-CM

## 2023-07-06 NOTE — Progress Notes (Signed)
[facility] Cancer Follow up Visit:  Patient Care Team: [place], MD as PCP - General (Internal Medicine) Loni Muse, MD as Attending Physician (Hematology)  CHIEF COMPLAINTS/PURPOSE OF CONSULTATION:  HISTORY OF PRESENTING ILLNESS: Becky Cox 72 y.o. female is here because of anemia Medical history notable for thyroid nodule, hypothyroidism, morbid obesity, obstructive sleep apnea, bilateral hearing loss, hypertension, rheumatoid arthritis, chronic diarrhea, hypertension, gout  March 22 2022:  Colonoscopy.  3 very small polyps  July 29, 2022: WBC 7.2 hemoglobin 10.3 MCV 93 platelet count 311; 45 segs 44 lymphs 5 monos 5 eos 1 basophil.  Specimen was prewarmed to 37 degrees to obtain results.  Technician suspected possibility of cold agglutinins/cryoglobulins present Chemistries notable for creatinine 0.79 albumin 3.9.  AST ALT and alk phos normal T. bili 2.3 direct bilirubin 0.6.  Reticulocyte count 2.8%  September 09 2022:  [facility] Hematology Consult Was told years ago that she was anemic.   Has never taken oral iron.  No history of IV iron or PRBC's.   Does not take ASA.  Mainly takes Tylenol for pain; rarely ibuprofen No pica to ice.  Uses CPAP every night  Social:  Divorced.  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  Tobacco none.  EtOH  [facility] Mother died 76 MI Father died 47 pneumonia Sister died 27 CVA, MS Sister alive 57 colon cancer Sister alive 52 Parkinson's disease Sister alive 51 HTN Sister alive 5 osteoarthritis Brother alive 46 TBI from motorcycle accident, HTN Brother died 59 CVA Brother died 77 colon cancer  WBC 9.6 hemoglobin 10.2 MCV 97 platelet count 346; 55 segs 35 lymphs 5 monos 4 eos 1 basophil.  Reticulocyte count 3.3% Coombs test complement positive/IgG negative.  Haptoglobin undetectable.  Cold agglutinin titer greater than 1:4096 Hemoglobin electrophoresis normal adult pattern. SPEP with IEP  showed no paraprotein. IgG 624 IgA 121 IgM 74 Serum free kappa 25.1 lambda 28.3 with a kappa lambda 0.89 B12 500 folate 27.3 ferritin 242 Copper 128 zinc 97 CMP notable for T. bili of 3.3 glucose 102  September 23, 2022: Underwent bone marrow biopsy and aspirate Pathology revealed A minor CD10 positive B-cell population(1% of the lymphocytes) with kappa excess. 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  Did not have a lot of discomfort following the bone marrow Reviewed results of labs and bone marrow results with patient.  Experiencing fatigue.  Having myalgias of legs.  Notes that her urine is dark  October 07 2022:  Rituxan 375 mg/m2  Hgb 8.8 October 14 2022  Rituxan 375 mg/m2.  Hgb 9.2  October 21 2022:  Rituxan 375 mg/m2  Hgb 10.1  October 28 2022:   Had a minor infusion rxn during first Rituxan infusion but was able to complete it and receive subsequent infusions.    December 09 2022:   Reviewed results of labs with patient and granddaughter.  Feels well.  Had COVID around Christmas for which she took Paxlovid.    WBC 5.5 hemoglobin 11.7 MCV 94.7 platelet count 289; 56 segs 32 lymphs 6 monos 5 eos 1 basophil reticulocyte count 1.6% Coombs positive for complement negative for IgG.  Haptoglobin 74 LDH 255 CMP notable for total protein 5.9  March 10 2023:   Feels well albeit a bit fatigued.  Reviewed results of labs with patient Hgb 11.4.  Cold agglutinin titer 1:1024 Haptoglobin 101  LDH 210  May 05 2023:    To retire at the end of the month but has plans to keep busy.  Reviewed results of labs with patient.  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   Now retired.   Feels well.    Hg 12.1   T bili 1.7  LDH 184  Review of Systems  Constitutional:  Negative for  appetite change, chills, fatigue, fever and unexpected weight change.  HENT:   Negative for mouth sores, nosebleeds, sore throat, trouble swallowing and voice change.   Eyes:  Negative for eye problems and icterus.       Vision changes:  None  Respiratory:  Negative for chest tightness, cough, hemoptysis and wheezing.        Some DOE when does a lot of walking  Cardiovascular:  Positive for leg swelling. Negative for chest pain and palpitations.       PND:  none Orthopnea:  none  Gastrointestinal:  Negative for abdominal pain, blood in stool, constipation, diarrhea, nausea and vomiting.  Endocrine: Negative for hot flashes.       Cold intolerance:  none Heat intolerance:  none  Genitourinary:  Negative for bladder incontinence, difficulty urinating, dysuria, frequency, hematuria and nocturia.   Musculoskeletal:  Positive for arthralgias. Negative for back pain, gait problem, myalgias and neck pain.       Arthralgias of knees and ankles  Skin:  Negative for itching, rash and wound.  Neurological:  Negative for dizziness, extremity weakness, gait problem, headaches and numbness.  Hematological:  Negative for adenopathy. Does not bruise/bleed easily.  Psychiatric/Behavioral:  Negative for sleep disturbance and suicidal ideas. The patient is not nervous/anxious.     MEDICAL HISTORY: Past Medical History:  Diagnosis Date   Fibroid    Hypertension    Thyroid disease    Nodules    SURGICAL HISTORY: Past Surgical History:  Procedure Laterality Date   ABDOMINAL HYSTERECTOMY  1996   ANKLE SURGERY  04/2011   BONE SPURS IN RIGHT ANKLE   BREAST BIOPSY Left 2019   benign   BREAST BIOPSY Right 2021   FOOT SURGERY  2007 2008   KNEE SURGERY     Arthroscopic   MOLE REMOVAL  2009   BENIGN   THYROID CYST EXCISION     2001    SOCIAL HISTORY: Social History   Socioeconomic History   Marital status: Divorced    Spouse name: Not on file   Number of children: Not on file   Years of  education: Not on file   Highest education level: Not on file  Occupational History   Not on file  Tobacco Use   Smoking status: Never   Smokeless tobacco: Never  Vaping Use   Vaping status: Never Used  Substance and Sexual Activity   Alcohol use: No    Alcohol/week: 0.0 standard drinks of alcohol   Drug use: No   Sexual activity: Not Currently    Comment: INTERCOURSE AGE UNKNOWN , SEXUAL PARTNERS MOE THAN 6  Other Topics Concern   Not on file  Social History Narrative   Not on file   Social Determinants of Health   Financial Resource Strain: Not on file  Food Insecurity: Not on file  Transportation Needs: Not on file  Physical Activity: Not on file  Stress: Not on file  Social Connections: Unknown (03/29/2022)   Received  from [facility]   Social Network    Social Network: Not on file  Intimate Partner Violence: Unknown (03/01/2022)   Received from [facility], [facility]   HITS    Physically Hurt: Not on file    Insult or Talk Down To: Not on file    Threaten Physical Harm: Not on file    Scream or Curse: Not on file    FAMILY HISTORY Family History  Problem Relation Age of Onset   Breast cancer Sister 54   Hypertension Sister    Cancer Sister        COLON   Stroke Brother    Hypertension Brother    Breast cancer Maternal Grandmother     ALLERGIES:  is allergic to rituximab.  MEDICATIONS:  Current Outpatient Medications  Medication Sig Dispense Refill   allopurinol (ZYLOPRIM) 300 MG tablet Take 1 tablet by mouth daily.     amLODipine (NORVASC) 5 MG tablet Take 10 tablets by mouth daily.      Ascorbic Acid (VITAMIN C PO) Take by mouth.     atorvastatin (LIPITOR) 10 MG tablet Take 1 tablet by mouth daily.     diclofenac Sodium (VOLTAREN) 1 % GEL Apply topically.     fluticasone (FLONASE) 50 MCG/ACT nasal spray Place 2 sprays into the nose daily.     folic acid (FOLVITE) 1 MG tablet Take 1 mg by mouth daily.     ipratropium (ATROVENT)  0.06 % nasal spray Place 2 sprays into both nostrils 4 (four) times daily. 15 mL 2   levothyroxine (SYNTHROID) 100 MCG tablet Take 100 mcg by mouth daily.     methotrexate (RHEUMATREX) 2.5 MG tablet take 6 tablets     montelukast (SINGULAIR) 10 MG tablet Take 1 tablet by mouth at bedtime.     Multiple Vitamin (MULTIVITAMIN PO) Take 1 tablet by mouth daily.     olmesartan (BENICAR) 20 MG tablet Take 20 mg by mouth daily.     Semaglutide-Weight Management (WEGOVY) 0.5 MG/0.5ML SOAJ Inject 0.5 mg into the skin once a week. 2 mL 0   triamterene-hydrochlorothiazide (MAXZIDE-25) 37.5-25 MG tablet Take 1 tablet by mouth daily.     No current facility-administered medications for this visit.    PHYSICAL EXAMINATION:  ECOG PERFORMANCE STATUS: 1 - Symptomatic but completely ambulatory   There were no vitals filed for this visit.    There were no vitals filed for this visit.     Physical Exam Vitals and nursing note reviewed.  Constitutional:      General: She is not in acute distress.    Appearance: Normal appearance. She is obese. She is not ill-appearing, toxic-appearing or diaphoretic.     Comments: Here alone.  Comfortable  HENT:     Head: Normocephalic and atraumatic.     Right Ear: External ear normal.     Left Ear: External ear normal.     Nose: Nose normal. No congestion or rhinorrhea.  Eyes:     General: No scleral icterus.    Extraocular Movements: Extraocular movements intact.     Conjunctiva/sclera: Conjunctivae normal.     Pupils: Pupils are equal, round, and reactive to light.  Cardiovascular:     Rate and Rhythm: Normal rate and regular rhythm.     Heart sounds: Normal heart sounds. No murmur heard.    No friction rub. No gallop.  Pulmonary:     Effort: Pulmonary effort is normal. No respiratory distress.  Breath sounds: No stridor. No wheezing, rhonchi or rales.  Abdominal:     General: Bowel sounds are normal. There is no distension.     Palpations:  Abdomen is soft. There is no mass.     Tenderness: There is no abdominal tenderness. There is no guarding or rebound.     Hernia: No hernia is present.  Musculoskeletal:        General: No swelling, tenderness, deformity or signs of injury.     Cervical back: Normal range of motion and neck supple. No rigidity or tenderness.     Right lower leg: No edema.     Left lower leg: No edema.  Lymphadenopathy:     Head:     Right side of head: No submental, submandibular, tonsillar, preauricular, posterior auricular or occipital adenopathy.     Left side of head: No submental, submandibular, tonsillar, preauricular, posterior auricular or occipital adenopathy.     Cervical: No cervical adenopathy.     Right cervical: No superficial, deep or posterior cervical adenopathy.    Left cervical: No superficial, deep or posterior cervical adenopathy.     Upper Body:     Right upper body: No supraclavicular, axillary, pectoral or epitrochlear adenopathy.     Left upper body: No supraclavicular, axillary, pectoral or epitrochlear adenopathy.  Skin:    General: Skin is warm.     Coloration: Skin is not jaundiced or pale.     Findings: No bruising or erythema.  Neurological:     General: No focal deficit present.     Mental Status: She is alert and oriented to person, place, and time. Mental status is at baseline.     Cranial Nerves: No cranial nerve deficit.     Motor: No weakness.     Gait: Gait normal.  Psychiatric:        Mood and Affect: Mood normal.        Behavior: Behavior normal.        Thought Content: Thought content normal.        Judgment: Judgment normal.    LABORATORY DATA: I have personally reviewed the data as listed:  No visits with results within 1 Month(s) from this visit.  Latest known visit with results is:  Appointment on 05/05/2023  Component Date Value Ref Range Status   Retic Ct Pct 05/05/2023 2.3  0.4 - 3.1 % Final   RBC. 05/05/2023 3.86 (L)  3.87 - 5.11 MIL/uL Final    Retic Count, Absolute 05/05/2023 90.3  19.0 - 186.0 K/uL Final   Immature Retic Fract 05/05/2023 10.5  2.3 - 15.9 % Final   Performed at [facility], [address] [place], [name] [address]   Cold Agglutinin Titer 05/05/2023 1:512 (H)  Neg <1:32 Final   Comment: (NOTE) Performed At: [facility] [address] [place], [name] [id number] Jolene Schimke MD CZ:[id number]    Haptoglobin 05/05/2023 86  37 - 355 mg/dL Final   Comment: (NOTE) Performed At: [facility] [address] [place], [name] [id number] Jolene Schimke MD UK:[id number]    LDH 05/05/2023 217 (H)  98 - 192 U/L Final   Performed at [facility], [address], [place], [name] [address]   Sodium 05/05/2023 141  135 - 145 mmol/L Final   Potassium 05/05/2023 3.5  3.5 - 5.1 mmol/L Final   Chloride 05/05/2023 105  98 - 111 mmol/L Final   CO2 05/05/2023 29  22 - 32 mmol/L  Final   Glucose, Bld 05/05/2023 96  70 - 99 mg/dL Final   Glucose reference range applies only to samples taken after fasting for at least 8 hours.   BUN 05/05/2023 19  8 - 23 mg/dL Final   Creatinine 78/29/5621 0.82  0.44 - 1.00 mg/dL Final   Calcium 30/86/5784 9.4  8.9 - 10.3 mg/dL Final   Total Protein 69/62/9528 6.5  6.5 - 8.1 g/dL Final   Albumin 41/32/4401 4.1  3.5 - 5.0 g/dL Final   AST 02/72/5366 28  15 - 41 U/L Final   ALT 05/05/2023 27  0 - 44 U/L Final   Alkaline Phosphatase 05/05/2023 98  38 - 126 U/L Final   Total Bilirubin 05/05/2023 2.1 (H)  0.3 - 1.2 mg/dL Final   GFR, Estimated 05/05/2023 >60  >60 mL/min Final   Comment: (NOTE) Calculated using the CKD-EPI Creatinine Equation (2021)    Anion gap 05/05/2023 7  5 - 15 Final   Performed at [facility], [address], [place], [name] [address]   WBC Count 05/05/2023 6.6  4.0 - 10.5 K/uL Final   RBC 05/05/2023 3.87  3.87 - 5.11 MIL/uL Final   Hemoglobin 05/05/2023 11.4 (L)  12.0 - 15.0  g/dL Final   HCT 74/25/9563 35.5 (L)  36.0 - 46.0 % Final   MCV 05/05/2023 91.7  80.0 - 100.0 fL Final   MCH 05/05/2023 29.5  26.0 - 34.0 pg Final   MCHC 05/05/2023 32.1  30.0 - 36.0 g/dL Final   RDW 87/56/4332 14.8  11.5 - 15.5 % Final   Platelet Count 05/05/2023 318  150 - 400 K/uL Final   nRBC 05/05/2023 0.0  0.0 - 0.2 % Final   Neutrophils Relative % 05/05/2023 52  % Final   Neutro Abs 05/05/2023 3.4  1.7 - 7.7 K/uL Final   Lymphocytes Relative 05/05/2023 35  % Final   Lymphs Abs 05/05/2023 2.3  0.7 - 4.0 K/uL Final   Monocytes Relative 05/05/2023 6  % Final   Monocytes Absolute 05/05/2023 0.4  0.1 - 1.0 K/uL Final   Eosinophils Relative 05/05/2023 6  % Final   Eosinophils Absolute 05/05/2023 0.4  0.0 - 0.5 K/uL Final   Basophils Relative 05/05/2023 1  % Final   Basophils Absolute 05/05/2023 0.0  0.0 - 0.1 K/uL Final   Immature Granulocytes 05/05/2023 0  % Final   Abs Immature Granulocytes 05/05/2023 0.01  0.00 - 0.07 K/uL Final   Performed at [facility], [address] [place], [name] [address]    RADIOGRAPHIC STUDIES: I have personally reviewed the radiological images as listed and agree with the findings in the report  No results found.  ASSESSMENT/PLAN  72 y.o. female with medical history notable for thyroid nodule, hypothyroidism, morbid obesity, obstructive sleep apnea, bilateral hearing loss, hypertension, rheumatoid arthritis, chronic diarrhea, hypertension, gout.  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 Labs notable for  Coombs (IgG negative/ Complement positive) Haptoglobin < 10 Cold agglutinin titer greater than 1:4096  Bilirubin 3.3 Smear showed RBC clumping.  Hgb 10.2 Bone marrow bx showed expanded erythron and  monoclonal B cell population (1%) with kappa restriction.  Has been reported in patients with systemic  rheumatic diseases   Therapeutics:  Patient had a high Cold agglutinin titer with symptomatic anemia October 14 2022 Cycle 1 Day 1 Rituxan 375 mg/m2  IV weekly x 4.   October 28 2022:  Cycle 1 Day 4 weekly Rituxan December 09 2022:  Hgb 11.7  Reticulocytes 1.6% Haptoglobin 74 LDH 255  Coombs remains positive March 10 2023- Hgb 11.4.  Cold agglutinin titer 1:1024 Haptoglobin 101  LDH 210.  Not having hemolysis clinically.  Cold agglutinin titer greatly improved but still positive.  Haptoglobin normal.    Excellent response to therapy.    May 05 2023-- Hgb 11.4 Retic 2.3 Tbili 2.1 LDH 217   Hgb stable.  Retic and tbili increasing.  Cold agglutinin titer improved at 1:512.  Haptoglobin 86.    July 07 2023:  Hgb 12.1.  Retic 1.7 LDH 184.  Cold agglutinin titer and Haptoglobin pending.  At this time it appears that she does not require retreatment with Rituxan.   Can stretch out intervals between follow up visits with next one in 3 to 4 months.   Coombs negativity is not a treatment requirement.  Will continue to follow with periodic labs.  Can retreat with Rituxan if relapses   Positive RNP: May be related to history of RA.     Reminded patient of the importance of avoiding cold as this can exacerbate hemolysis.  Taking folic acid 1 mg daily to support RBC production  and allopurinol due to increased uric acid    Cancer Staging  No matching staging information was found for the patient.    No problem-specific Assessment & Plan notes found for this encounter.   No orders of the defined types were placed in this encounter.  30  minutes was spent in patient care.  This included time spent preparing to see the patient (e.g., review of tests), obtaining and/or reviewing separately obtained history, counseling and educating the patient/family/caregiver, ordering tests, documenting clinical information in the electronic or other health record, independently interpreting results and communicating  results to the patient/family/caregiver as well as coordination of care.      All questions were answered. The patient knows to call the clinic with any problems, questions or concerns.  This note was electronically signed.    Loni Muse, MD  07/06/2023 11:42 AM

## 2023-07-07 ENCOUNTER — Encounter: Payer: Self-pay | Admitting: Oncology

## 2023-07-07 ENCOUNTER — Inpatient Hospital Stay: Payer: Medicare PPO | Attending: Oncology | Admitting: Oncology

## 2023-07-07 ENCOUNTER — Other Ambulatory Visit: Payer: Self-pay

## 2023-07-07 ENCOUNTER — Inpatient Hospital Stay: Payer: Medicare PPO

## 2023-07-07 VITALS — BP 139/57 | HR 80 | Temp 98.5°F | Resp 17 | Wt 246.0 lb

## 2023-07-07 DIAGNOSIS — E039 Hypothyroidism, unspecified: Secondary | ICD-10-CM | POA: Diagnosis not present

## 2023-07-07 DIAGNOSIS — D5912 Cold autoimmune hemolytic anemia: Secondary | ICD-10-CM | POA: Diagnosis present

## 2023-07-07 DIAGNOSIS — G4733 Obstructive sleep apnea (adult) (pediatric): Secondary | ICD-10-CM | POA: Insufficient documentation

## 2023-07-07 DIAGNOSIS — C8580 Other specified types of non-Hodgkin lymphoma, unspecified site: Secondary | ICD-10-CM | POA: Diagnosis not present

## 2023-07-07 DIAGNOSIS — I10 Essential (primary) hypertension: Secondary | ICD-10-CM | POA: Diagnosis not present

## 2023-07-07 DIAGNOSIS — Z79899 Other long term (current) drug therapy: Secondary | ICD-10-CM | POA: Diagnosis not present

## 2023-07-07 DIAGNOSIS — M069 Rheumatoid arthritis, unspecified: Secondary | ICD-10-CM | POA: Insufficient documentation

## 2023-07-07 DIAGNOSIS — D479 Neoplasm of uncertain behavior of lymphoid, hematopoietic and related tissue, unspecified: Secondary | ICD-10-CM

## 2023-07-07 DIAGNOSIS — R768 Other specified abnormal immunological findings in serum: Secondary | ICD-10-CM

## 2023-07-07 LAB — CMP (CANCER CENTER ONLY)
ALT: 25 U/L (ref 0–44)
AST: 26 U/L (ref 15–41)
Albumin: 3.9 g/dL (ref 3.5–5.0)
Alkaline Phosphatase: 99 U/L (ref 38–126)
Anion gap: 7 (ref 5–15)
BUN: 17 mg/dL (ref 8–23)
CO2: 28 mmol/L (ref 22–32)
Calcium: 8.9 mg/dL (ref 8.9–10.3)
Chloride: 107 mmol/L (ref 98–111)
Creatinine: 0.83 mg/dL (ref 0.44–1.00)
GFR, Estimated: >60 mL/min (ref >60)
Glucose, Bld: 89 mg/dL (ref 70–99)
Potassium: 3.8 mmol/L (ref 3.5–5.1)
Sodium: 142 mmol/L (ref 135–145)
Total Bilirubin: 1.7 mg/dL — ABNORMAL HIGH (ref 0.3–1.2)
Total Protein: 6.2 g/dL — ABNORMAL LOW (ref 6.5–8.1)

## 2023-07-07 LAB — CBC WITH DIFFERENTIAL (CANCER CENTER ONLY)
Abs Immature Granulocytes: 0.01 10*3/uL (ref 0.00–0.07)
Basophils Absolute: 0 10*3/uL (ref 0.0–0.1)
Basophils Relative: 1 %
Eosinophils Absolute: 0.5 10*3/uL (ref 0.0–0.5)
Eosinophils Relative: 8 %
HCT: 32.3 % — ABNORMAL LOW (ref 36.0–46.0)
Hemoglobin: 12.1 g/dL (ref 12.0–15.0)
Immature Granulocytes: 0 %
Lymphocytes Relative: 35 %
Lymphs Abs: 1.9 10*3/uL (ref 0.7–4.0)
MCH: 35.1 pg — ABNORMAL HIGH (ref 26.0–34.0)
MCHC: 37.5 g/dL — ABNORMAL HIGH (ref 30.0–36.0)
MCV: 93.6 fL (ref 80.0–100.0)
Monocytes Absolute: 0.3 10*3/uL (ref 0.1–1.0)
Monocytes Relative: 5 %
Neutro Abs: 2.8 10*3/uL (ref 1.7–7.7)
Neutrophils Relative %: 51 %
Platelet Count: 307 10*3/uL (ref 150–400)
RBC: 3.45 MIL/uL — ABNORMAL LOW (ref 3.87–5.11)
RDW: 17.6 % — ABNORMAL HIGH (ref 11.5–15.5)
WBC Count: 5.5 10*3/uL (ref 4.0–10.5)
nRBC: 0 % (ref 0.0–0.2)

## 2023-07-07 LAB — RETICULOCYTES
Immature Retic Fract: 11 % (ref 2.3–15.9)
RBC.: 4.11 MIL/uL (ref 3.87–5.11)
Retic Count, Absolute: 70.3 10*3/uL (ref 19.0–186.0)
Retic Ct Pct: 1.7 % (ref 0.4–3.1)

## 2023-07-07 LAB — LACTATE DEHYDROGENASE: LDH: 184 U/L (ref 98–192)

## 2023-07-17 ENCOUNTER — Other Ambulatory Visit (HOSPITAL_BASED_OUTPATIENT_CLINIC_OR_DEPARTMENT_OTHER): Payer: Self-pay

## 2023-07-19 ENCOUNTER — Other Ambulatory Visit (HOSPITAL_BASED_OUTPATIENT_CLINIC_OR_DEPARTMENT_OTHER): Payer: Self-pay

## 2023-07-19 MED ORDER — WEGOVY 0.25 MG/0.5ML ~~LOC~~ SOAJ
0.2500 mg | SUBCUTANEOUS | 1 refills | Status: DC
  Filled 2023-07-20 – 2023-07-21 (×2): qty 2, 28d supply, fill #0

## 2023-07-19 MED ORDER — WEGOVY 0.5 MG/0.5ML ~~LOC~~ SOAJ
0.5000 mg | SUBCUTANEOUS | 0 refills | Status: DC
  Filled 2023-07-20: qty 2, 28d supply, fill #0

## 2023-07-20 ENCOUNTER — Other Ambulatory Visit (HOSPITAL_BASED_OUTPATIENT_CLINIC_OR_DEPARTMENT_OTHER): Payer: Self-pay

## 2023-07-20 ENCOUNTER — Other Ambulatory Visit: Payer: Self-pay

## 2023-07-21 ENCOUNTER — Other Ambulatory Visit (HOSPITAL_BASED_OUTPATIENT_CLINIC_OR_DEPARTMENT_OTHER): Payer: Self-pay

## 2023-07-21 MED ORDER — DILTIAZEM HCL ER COATED BEADS 360 MG PO CP24
360.0000 mg | ORAL_CAPSULE | Freq: Every day | ORAL | 3 refills | Status: DC
Start: 2023-01-16 — End: 2023-07-21
  Filled 2023-07-21: qty 90, 90d supply, fill #0

## 2023-07-26 ENCOUNTER — Other Ambulatory Visit (HOSPITAL_BASED_OUTPATIENT_CLINIC_OR_DEPARTMENT_OTHER): Payer: Self-pay

## 2023-07-26 MED ORDER — WEGOVY 1 MG/0.5ML ~~LOC~~ SOAJ
1.0000 mg | SUBCUTANEOUS | 0 refills | Status: DC
  Filled 2023-07-26: qty 2, 28d supply, fill #0

## 2023-07-27 ENCOUNTER — Other Ambulatory Visit (HOSPITAL_BASED_OUTPATIENT_CLINIC_OR_DEPARTMENT_OTHER): Payer: Self-pay

## 2023-08-24 ENCOUNTER — Other Ambulatory Visit (HOSPITAL_BASED_OUTPATIENT_CLINIC_OR_DEPARTMENT_OTHER): Payer: Self-pay

## 2023-08-24 DIAGNOSIS — R7303 Prediabetes: Secondary | ICD-10-CM | POA: Insufficient documentation

## 2023-08-24 DIAGNOSIS — L819 Disorder of pigmentation, unspecified: Secondary | ICD-10-CM | POA: Insufficient documentation

## 2023-08-24 DIAGNOSIS — K5903 Drug induced constipation: Secondary | ICD-10-CM | POA: Insufficient documentation

## 2023-08-24 DIAGNOSIS — D5912 Cold autoimmune hemolytic anemia: Secondary | ICD-10-CM | POA: Insufficient documentation

## 2023-08-24 MED ORDER — WEGOVY 1 MG/0.5ML ~~LOC~~ SOAJ
1.0000 mg | SUBCUTANEOUS | 0 refills | Status: DC
  Filled 2023-09-22: qty 2, 28d supply, fill #0

## 2023-09-15 ENCOUNTER — Other Ambulatory Visit (HOSPITAL_BASED_OUTPATIENT_CLINIC_OR_DEPARTMENT_OTHER): Payer: Self-pay

## 2023-09-22 ENCOUNTER — Other Ambulatory Visit (HOSPITAL_BASED_OUTPATIENT_CLINIC_OR_DEPARTMENT_OTHER): Payer: Self-pay

## 2023-09-22 MED ORDER — WEGOVY 1 MG/0.5ML ~~LOC~~ SOAJ
1.0000 mg | SUBCUTANEOUS | 0 refills | Status: DC
  Filled 2023-09-22: qty 2, 28d supply, fill #0

## 2023-09-23 ENCOUNTER — Other Ambulatory Visit (HOSPITAL_BASED_OUTPATIENT_CLINIC_OR_DEPARTMENT_OTHER): Payer: Self-pay

## 2023-09-26 ENCOUNTER — Telehealth: Payer: Self-pay | Admitting: Oncology

## 2023-09-28 ENCOUNTER — Other Ambulatory Visit (HOSPITAL_BASED_OUTPATIENT_CLINIC_OR_DEPARTMENT_OTHER): Payer: Self-pay

## 2023-09-29 ENCOUNTER — Other Ambulatory Visit: Payer: Self-pay

## 2023-09-29 ENCOUNTER — Other Ambulatory Visit: Payer: Self-pay | Admitting: Oncology

## 2023-09-29 ENCOUNTER — Inpatient Hospital Stay: Payer: Medicare PPO | Attending: Oncology

## 2023-09-29 ENCOUNTER — Inpatient Hospital Stay: Payer: Medicare PPO | Admitting: Oncology

## 2023-09-29 VITALS — BP 126/59 | HR 73 | Temp 97.9°F | Resp 16 | Ht 62.0 in | Wt 239.7 lb

## 2023-09-29 DIAGNOSIS — I10 Essential (primary) hypertension: Secondary | ICD-10-CM | POA: Insufficient documentation

## 2023-09-29 DIAGNOSIS — R768 Other specified abnormal immunological findings in serum: Secondary | ICD-10-CM

## 2023-09-29 DIAGNOSIS — Z79899 Other long term (current) drug therapy: Secondary | ICD-10-CM | POA: Insufficient documentation

## 2023-09-29 DIAGNOSIS — D649 Anemia, unspecified: Secondary | ICD-10-CM

## 2023-09-29 DIAGNOSIS — G4733 Obstructive sleep apnea (adult) (pediatric): Secondary | ICD-10-CM | POA: Diagnosis not present

## 2023-09-29 DIAGNOSIS — M109 Gout, unspecified: Secondary | ICD-10-CM | POA: Diagnosis not present

## 2023-09-29 DIAGNOSIS — E039 Hypothyroidism, unspecified: Secondary | ICD-10-CM | POA: Diagnosis not present

## 2023-09-29 DIAGNOSIS — M069 Rheumatoid arthritis, unspecified: Secondary | ICD-10-CM | POA: Insufficient documentation

## 2023-09-29 DIAGNOSIS — Z79890 Hormone replacement therapy (postmenopausal): Secondary | ICD-10-CM | POA: Insufficient documentation

## 2023-09-29 DIAGNOSIS — D5912 Cold autoimmune hemolytic anemia: Secondary | ICD-10-CM | POA: Diagnosis present

## 2023-09-29 DIAGNOSIS — Z299 Encounter for prophylactic measures, unspecified: Secondary | ICD-10-CM

## 2023-09-29 DIAGNOSIS — D479 Neoplasm of uncertain behavior of lymphoid, hematopoietic and related tissue, unspecified: Secondary | ICD-10-CM

## 2023-09-29 DIAGNOSIS — H9193 Unspecified hearing loss, bilateral: Secondary | ICD-10-CM | POA: Diagnosis not present

## 2023-09-29 DIAGNOSIS — R5383 Other fatigue: Secondary | ICD-10-CM | POA: Diagnosis not present

## 2023-09-29 LAB — COMPREHENSIVE METABOLIC PANEL
ALT: 19 U/L (ref 0–44)
AST: 21 U/L (ref 15–41)
Albumin: 4 g/dL (ref 3.5–5.0)
Alkaline Phosphatase: 100 U/L (ref 38–126)
Anion gap: 5 (ref 5–15)
BUN: 20 mg/dL (ref 8–23)
CO2: 28 mmol/L (ref 22–32)
Calcium: 9.2 mg/dL (ref 8.9–10.3)
Chloride: 107 mmol/L (ref 98–111)
Creatinine, Ser: 0.81 mg/dL (ref 0.44–1.00)
GFR, Estimated: >60 mL/min (ref >60)
Glucose, Bld: 92 mg/dL (ref 70–99)
Potassium: 3.8 mmol/L (ref 3.5–5.1)
Sodium: 140 mmol/L (ref 135–145)
Total Bilirubin: 1.8 mg/dL — ABNORMAL HIGH (ref 0.3–1.2)
Total Protein: 6.5 g/dL (ref 6.5–8.1)

## 2023-09-29 LAB — CBC WITH DIFFERENTIAL/PLATELET
Abs Immature Granulocytes: 0.02 10*3/uL (ref 0.00–0.07)
Basophils Absolute: 0.1 10*3/uL (ref 0.0–0.1)
Basophils Relative: 1 %
Eosinophils Absolute: 0.4 10*3/uL (ref 0.0–0.5)
Eosinophils Relative: 6 %
HCT: 32.4 % — ABNORMAL LOW (ref 36.0–46.0)
Hemoglobin: 11.2 g/dL — ABNORMAL LOW (ref 12.0–15.0)
Immature Granulocytes: 0 %
Lymphocytes Relative: 32 %
Lymphs Abs: 2.3 10*3/uL (ref 0.7–4.0)
MCH: 32.7 pg (ref 26.0–34.0)
MCHC: 34.6 g/dL (ref 30.0–36.0)
MCV: 94.5 fL (ref 80.0–100.0)
Monocytes Absolute: 0.6 10*3/uL (ref 0.1–1.0)
Monocytes Relative: 9 %
Neutro Abs: 3.8 10*3/uL (ref 1.7–7.7)
Neutrophils Relative %: 52 %
Platelets: 314 10*3/uL (ref 150–400)
RBC: 3.43 MIL/uL — ABNORMAL LOW (ref 3.87–5.11)
RDW: 16.9 % — ABNORMAL HIGH (ref 11.5–15.5)
WBC: 7.2 10*3/uL (ref 4.0–10.5)
nRBC: 0 % (ref 0.0–0.2)

## 2023-09-29 LAB — HEMOGLOBIN A1C
Hgb A1c MFr Bld: 5.1 % (ref 4.8–5.6)
Mean Plasma Glucose: 99.67 mg/dL

## 2023-09-29 LAB — RETICULOCYTES
Immature Retic Fract: 21.7 % — ABNORMAL HIGH (ref 2.3–15.9)
RBC.: 3.29 MIL/uL — ABNORMAL LOW (ref 3.87–5.11)
Retic Count, Absolute: 87.5 10*3/uL (ref 19.0–186.0)
Retic Ct Pct: 2.7 % (ref 0.4–3.1)

## 2023-09-29 LAB — LACTATE DEHYDROGENASE: LDH: 169 U/L (ref 98–192)

## 2023-09-29 NOTE — Progress Notes (Signed)
[facility]  HEMATOLOGY/ONCOLOGY PROGRESS NOTE  Patient Care Team: Harvest Forest, MD as PCP - General (Internal Medicine) Loni Muse, MD as Attending Physician (Hematology)  CHIEF COMPLAINT/ REASON FOR VISIT:  Follow up for anemia and cold agglutinin disease.   HISTORY OF PRESENTING ILLNESS:   Becky Cox is a 72 y.o. lady who was transferred to my care after her prior physician has left.   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  Other comorbidities include hypothyroidism, obstructive sleep apnea, bilateral hearing loss, hypertension, rheumatoid arthritis, chronic diarrhea, hypertension, gout.  I reviewed the patient's records extensively and collaborated the history with the patient. Summary of her history is as follows:  March 22 2022:  Colonoscopy.  3 very small polyps   July 29, 2022: WBC 7.2 hemoglobin 10.3 MCV 93 platelet count 311; 45 segs 44 lymphs 5 monos 5 eos 1 basophil.  Specimen was prewarmed to 37 degrees to obtain results.  Technician suspected possibility of cold agglutinins/cryoglobulins present Chemistries notable for creatinine 0.79 albumin 3.9.  AST ALT and alk phos normal T. bili 2.3 direct bilirubin 0.6.  Reticulocyte count 2.8%   September 09 2022:  [facility]ematology Consult Was told years ago that she was anemic.   Has never taken oral iron.  No history of IV iron or PRBC's.   Does not take ASA.  Mainly takes Tylenol for pain; rarely ibuprofen No pica to ice.  Uses CPAP every night   Social:  Divorced.  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  Tobacco none.  EtOH   [facility] Mother died 50 MI Father died 3 pneumonia Sister died 31 CVA, MS Sister alive 90 colon cancer Sister alive 83 Parkinson's disease Sister alive 57 HTN Sister alive 27 osteoarthritis Brother alive 54 TBI from motorcycle accident, HTN Brother died 85 CVA Brother died 33 colon cancer   WBC  9.6 hemoglobin 10.2 MCV 97 platelet count 346; 55 segs 35 lymphs 5 monos 4 eos 1 basophil.  Reticulocyte count 3.3% Coombs test complement positive/IgG negative.  Haptoglobin undetectable.  Cold agglutinin titer greater than 1:4096 Hemoglobin electrophoresis normal adult pattern. SPEP with IEP showed no paraprotein. IgG 624 IgA 121 IgM 74 Serum free kappa 25.1 lambda 28.3 with a kappa lambda 0.89 B12 500 folate 27.3 ferritin 242 Copper 128 zinc 97 CMP notable for T. bili of 3.3 glucose 102   September 23, 2022: Underwent bone marrow biopsy and aspirate Pathology revealed A minor CD10 positive B-cell population(1% of the lymphocytes) with kappa excess. Cytogenetics Normal female Karyotype  Neo Complrehensive Myeloid disorder panel normal     September 30, 2022: Scheduled follow-up for management of cold agglutinin disease.  Did not have a lot of discomfort following the bone marrow Reviewed results of labs and bone marrow results with patient.  Experiencing fatigue.  Having myalgias of legs.  Notes that her urine is dark   October 07 2022:  Rituxan 375 mg/m2  Hgb 8.8 October 14 2022  Rituxan 375 mg/m2.  Hgb 9.2  October 21 2022:  Rituxan 375 mg/m2  Hgb 10.1   October 28 2022:   Had a minor infusion rxn during first Rituxan infusion but was able to complete it and receive subsequent infusions.     December 09 2022:   Reviewed results of labs with patient and granddaughter.  Feels well.  Had COVID around Christmas for which she took Paxlovid.     WBC  5.5 hemoglobin 11.7 MCV 94.7 platelet count 289; 56 segs 32 lymphs 6 monos 5 eos 1 basophil reticulocyte count 1.6% Coombs positive for complement negative for IgG.  Haptoglobin 74 LDH 255 CMP notable for total protein 5.9   March 10 2023:   Feels well albeit a bit fatigued.  Reviewed results of labs with patient Hgb 11.4.  Cold agglutinin titer 1:1024 Haptoglobin 101  LDH 210   May 05 2023:    To retire at the end of the month but has plans  to keep busy.  Reviewed results of labs with patient.  Feels well Hgb 11.4 Retic 2.3 Tbili 2.1 LDH 217    July 07 2023:  Scheduled follow-up for management of cold agglutinin disease.   Now retired.   Feels well.    Hg 12.1   T bili 1.7  LDH 184  INTERVAL HISTORY:   It has been approximately one year since her last rituximab infusion. She reports variable energy levels and denies any obvious blood loss such as hematochezia, melena, epistaxis, or gingival bleeding. She has been trying to maintain an active lifestyle with walking, water aerobics, and chair yoga, but struggles with cold intolerance, particularly in the pool. She has not been able to complete a mile walk at the [employer], managing only six out of ten laps. She also expresses concern about the upcoming winter months and her ability to stay warm.  MEDICAL HISTORY:  Past Medical History:  Diagnosis Date   Fibroid    Hypertension    Thyroid disease    Nodules    SURGICAL HISTORY: Past Surgical History:  Procedure Laterality Date   ABDOMINAL HYSTERECTOMY  1996   ANKLE SURGERY  04/2011   BONE SPURS IN RIGHT ANKLE   BREAST BIOPSY Left 2019   benign   BREAST BIOPSY Right 2021   FOOT SURGERY  2007 2008   KNEE SURGERY     Arthroscopic   MOLE REMOVAL  2009   BENIGN   THYROID CYST EXCISION     2001    SOCIAL HISTORY: Social History   Socioeconomic History   Marital status: Divorced    Spouse name: Not on file   Number of children: Not on file   Years of education: Not on file   Highest education level: Not on file  Occupational History   Not on file  Tobacco Use   Smoking status: Never   Smokeless tobacco: Never  Vaping Use   Vaping status: Never Used  Substance and Sexual Activity   Alcohol use: No    Alcohol/week: 0.0 standard drinks of alcohol   Drug use: No   Sexual activity: Not Currently    Comment: INTERCOURSE AGE UNKNOWN , SEXUAL PARTNERS MOE THAN 6  Other Topics Concern   Not on file  Social  History Narrative   Not on file   Social Determinants of Health   Financial Resource Strain: Not on file  Food Insecurity: Not on file  Transportation Needs: Not on file  Physical Activity: Not on file  Stress: Not on file  Social Connections: Unknown (03/29/2022)   Received from [facility]   Social Network    Social Network: Not on file  Intimate Partner Violence: Unknown (03/01/2022)   [facility]   HITS    Physically Hurt: Not on file    Insult or Talk Down To: Not on file    Threaten Physical Harm: Not on file  Scream or Curse: Not on file    FAMILY HISTORY: Family History  Problem Relation Age of Onset   Breast cancer Sister 46   Hypertension Sister    Cancer Sister        COLON   Stroke Brother    Hypertension Brother    Breast cancer Maternal Grandmother     ALLERGIES:  She is allergic to rituximab.  MEDICATIONS:  Current Outpatient Medications  Medication Sig Dispense Refill   allopurinol (ZYLOPRIM) 300 MG tablet Take 1 tablet by mouth daily.     amLODipine (NORVASC) 5 MG tablet Take 10 tablets by mouth daily.      Ascorbic Acid (VITAMIN C PO) Take by mouth.     atorvastatin (LIPITOR) 10 MG tablet Take 1 tablet by mouth daily.     diclofenac Sodium (VOLTAREN) 1 % GEL Apply topically.     fluticasone (FLONASE) 50 MCG/ACT nasal spray Place 2 sprays into the nose daily.     folic acid (FOLVITE) 1 MG tablet Take 1 mg by mouth daily.     ipratropium (ATROVENT) 0.06 % nasal spray Place 2 sprays into both nostrils 4 (four) times daily. 15 mL 2   levothyroxine (SYNTHROID) 100 MCG tablet Take 100 mcg by mouth daily.     methotrexate (RHEUMATREX) 2.5 MG tablet take 6 tablets     montelukast (SINGULAIR) 10 MG tablet Take 1 tablet by mouth at bedtime.     Multiple Vitamin (MULTIVITAMIN PO) Take 1 tablet by mouth daily.     olmesartan (BENICAR) 20 MG tablet Take 20 mg by mouth daily.     Semaglutide-Weight Management  (WEGOVY) 0.25 MG/0.5ML SOAJ Inject 0.25 mg into the skin once a week. 2 mL 1   Semaglutide-Weight Management (WEGOVY) 0.5 MG/0.5ML SOAJ Inject 0.5 mg into the skin once a week. 2 mL 0   Semaglutide-Weight Management (WEGOVY) 1 MG/0.5ML SOAJ Inject 1 mg into the skin once a week. 2 mL 0   Semaglutide-Weight Management (WEGOVY) 1 MG/0.5ML SOAJ Inject 1 mg into the skin once a week. 2 mL 0   Semaglutide-Weight Management (WEGOVY) 1 MG/0.5ML SOAJ Inject 1 mg into the skin once a week. 2 mL 0   triamterene-hydrochlorothiazide (MAXZIDE-25) 37.5-25 MG tablet Take 1 tablet by mouth daily.     No current facility-administered medications for this visit.    REVIEW OF SYSTEMS:    Review of Systems - Oncology  All other systems pertinent were reviewed with the patient and are negative, except as mentioned above.  PHYSICAL EXAMINATION:  ECOG PERFORMANCE STATUS: 1 - Symptomatic but completely ambulatory  Vitals:   09/29/23 1130  BP: (!) 126/59  Pulse: 73  Resp: 16  Temp: 97.9 F (36.6 C)  SpO2: 100%   Filed Weights   09/29/23 1130  Weight: 239 lb 11.2 oz (108.7 kg)    Physical Exam Constitutional:      General: She is not in acute distress.    Appearance: Normal appearance.  HENT:     Head: Normocephalic and atraumatic.  Eyes:     General: No scleral icterus.    Conjunctiva/sclera: Conjunctivae normal.  Cardiovascular:     Rate and Rhythm: Normal rate and regular rhythm.     Heart sounds: Normal heart sounds.  Pulmonary:     Effort: Pulmonary effort is normal.     Breath sounds: Normal breath sounds.  Abdominal:     General: There is no distension.  Musculoskeletal:     Right lower leg: No  edema.     Left lower leg: No edema.  Neurological:     General: No focal deficit present.     Mental Status: She is alert and oriented to person, place, and time.  Psychiatric:        Mood and Affect: Mood normal.        Behavior: Behavior normal.        Thought Content: Thought  content normal.     LABORATORY DATA:   I have reviewed the data as listed.  Results for orders placed or performed in visit on 09/29/23  Lactate dehydrogenase  Result Value Ref Range   LDH 169 98 - 192 U/L  Comprehensive metabolic panel  Result Value Ref Range   Sodium 140 135 - 145 mmol/L   Potassium 3.8 3.5 - 5.1 mmol/L   Chloride 107 98 - 111 mmol/L   CO2 28 22 - 32 mmol/L   Glucose, Bld 92 70 - 99 mg/dL   BUN 20 8 - 23 mg/dL   Creatinine, Ser 1.61 0.44 - 1.00 mg/dL   Calcium 9.2 8.9 - 09.6 mg/dL   Total Protein 6.5 6.5 - 8.1 g/dL   Albumin 4.0 3.5 - 5.0 g/dL   AST 21 15 - 41 U/L   ALT 19 0 - 44 U/L   Alkaline Phosphatase 100 38 - 126 U/L   Total Bilirubin 1.8 (H) 0.3 - 1.2 mg/dL   GFR, Estimated >04 >54 mL/min   Anion gap 5 5 - 15  CBC with Differential/Platelet  Result Value Ref Range   WBC 7.2 4.0 - 10.5 K/uL   RBC 3.43 (L) 3.87 - 5.11 MIL/uL   Hemoglobin 11.2 (L) 12.0 - 15.0 g/dL   HCT 09.8 (L) 11.9 - 14.7 %   MCV 94.5 80.0 - 100.0 fL   MCH 32.7 26.0 - 34.0 pg   MCHC 34.6 30.0 - 36.0 g/dL   RDW 82.9 (H) 56.2 - 13.0 %   Platelets 314 150 - 400 K/uL   nRBC 0.0 0.0 - 0.2 %   Neutrophils Relative % 52 %   Neutro Abs 3.8 1.7 - 7.7 K/uL   Lymphocytes Relative 32 %   Lymphs Abs 2.3 0.7 - 4.0 K/uL   Monocytes Relative 9 %   Monocytes Absolute 0.6 0.1 - 1.0 K/uL   Eosinophils Relative 6 %   Eosinophils Absolute 0.4 0.0 - 0.5 K/uL   Basophils Relative 1 %   Basophils Absolute 0.1 0.0 - 0.1 K/uL   Immature Granulocytes 0 %   Abs Immature Granulocytes 0.02 0.00 - 0.07 K/uL  Reticulocytes  Result Value Ref Range   Retic Ct Pct 2.7 0.4 - 3.1 %   RBC. 3.29 (L) 3.87 - 5.11 MIL/uL   Retic Count, Absolute 87.5 19.0 - 186.0 K/uL   Immature Retic Fract 21.7 (H) 2.3 - 15.9 %    RADIOGRAPHIC STUDIES:  No pertinent imaging studies available to review.  ASSESSMENT & PLAN:   72 y.o. 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   Cold agglutinins present Stable with intermittent fatigue. Hemoglobin decreased from 12.1 to 11.2. LDH normal. Haptoglobin and cold agglutinin titer pending.  -Last rituximab infusion approximately one year ago. -Repeat blood work in two months. -Consider repeat rituximab infusions if cold agglutinin titer increases. -She was advised to contact clinic if fatigue worsens. -Advise  to maintain physical activity as tolerated. -Recommend pool temperature closer to 90 degrees to avoid cold exposure.   Orders Placed This Encounter  Procedures   CBC with Differential/Platelet    Standing Status:   Future    Standing Expiration Date:   09/28/2024   Comprehensive metabolic panel    Standing Status:   Future    Standing Expiration Date:   09/28/2024   Haptoglobin    Standing Status:   Future    Standing Expiration Date:   09/28/2024   Lactate dehydrogenase    Standing Status:   Future    Standing Expiration Date:   09/28/2024   Reticulocytes    Standing Status:   Future    Standing Expiration Date:   09/28/2024   Cold agglutinin titer    Standing Status:   Future    Standing Expiration Date:   09/28/2024    I reviewed lab results and outside records for this visit and discussed relevant results with the patient. Diagnosis, plan of care and treatment options were also discussed in detail with the patient. Opportunity provided to ask questions and answers provided to her apparent satisfaction. Provided instructions to [facility] our clinic with any problems, questions or concerns prior to return visit. I recommended to continue follow-up with PCP and sub-specialists. She verbalized understanding and agreed with the plan.    The total time spent in the appointment was 30 minutes encounter with patients including review of chart and various tests  results, discussions about plan of care and coordination of care plan.    Meryl Crutch, MD  09/29/2023 2:10 PM  [facility] [address] [name] [address] Dept: [phone] Dept Fax: [phone]    This document was completed utilizing speech recognition software. Grammatical errors, random word insertions, pronoun errors, and incomplete sentences are an occasional consequence of this system due to software limitations, ambient noise, and hardware issues. Any formal questions or concerns about the content, text or information contained within the body of this dictation should be directly addressed to the provider for clarification.

## 2023-09-29 NOTE — Assessment & Plan Note (Addendum)
Stable with intermittent fatigue. Hemoglobin decreased from 12.1 to 11.2. LDH normal. Haptoglobin and cold agglutinin titer pending.  -Last rituximab infusion approximately one year ago. -Repeat blood work in two months. -Consider repeat rituximab infusions if cold agglutinin titer increases. -She was advised to contact clinic if fatigue worsens. -Advise to maintain physical activity as tolerated. -Recommend pool temperature closer to 90 degrees to avoid cold exposure.

## 2023-10-02 LAB — COLD AGGLUTININ TITER: Cold Agglutinin Titer: 1:128 {titer} — ABNORMAL HIGH

## 2023-10-03 DIAGNOSIS — E790 Hyperuricemia without signs of inflammatory arthritis and tophaceous disease: Secondary | ICD-10-CM | POA: Insufficient documentation

## 2023-10-03 DIAGNOSIS — R0981 Nasal congestion: Secondary | ICD-10-CM | POA: Insufficient documentation

## 2023-10-03 DIAGNOSIS — J329 Chronic sinusitis, unspecified: Secondary | ICD-10-CM | POA: Insufficient documentation

## 2023-10-03 DIAGNOSIS — M25561 Pain in right knee: Secondary | ICD-10-CM | POA: Insufficient documentation

## 2023-10-03 LAB — HAPTOGLOBIN: Haptoglobin: 148 mg/dL (ref 42–346)

## 2023-10-04 ENCOUNTER — Other Ambulatory Visit: Payer: Self-pay | Admitting: Internal Medicine

## 2023-10-04 DIAGNOSIS — Z13820 Encounter for screening for osteoporosis: Secondary | ICD-10-CM

## 2023-12-07 ENCOUNTER — Inpatient Hospital Stay: Payer: Medicare PPO | Admitting: Oncology

## 2023-12-07 ENCOUNTER — Inpatient Hospital Stay: Payer: Medicare PPO | Attending: Oncology

## 2023-12-07 ENCOUNTER — Encounter: Payer: Self-pay | Admitting: Oncology

## 2023-12-07 VITALS — BP 120/53 | HR 79 | Temp 98.1°F | Resp 18 | Wt 232.1 lb

## 2023-12-07 DIAGNOSIS — R5383 Other fatigue: Secondary | ICD-10-CM | POA: Diagnosis not present

## 2023-12-07 DIAGNOSIS — Z79890 Hormone replacement therapy (postmenopausal): Secondary | ICD-10-CM | POA: Diagnosis not present

## 2023-12-07 DIAGNOSIS — D5912 Cold autoimmune hemolytic anemia: Secondary | ICD-10-CM | POA: Diagnosis present

## 2023-12-07 DIAGNOSIS — G4733 Obstructive sleep apnea (adult) (pediatric): Secondary | ICD-10-CM | POA: Diagnosis not present

## 2023-12-07 DIAGNOSIS — I10 Essential (primary) hypertension: Secondary | ICD-10-CM | POA: Insufficient documentation

## 2023-12-07 DIAGNOSIS — D479 Neoplasm of uncertain behavior of lymphoid, hematopoietic and related tissue, unspecified: Secondary | ICD-10-CM

## 2023-12-07 DIAGNOSIS — H9193 Unspecified hearing loss, bilateral: Secondary | ICD-10-CM | POA: Insufficient documentation

## 2023-12-07 DIAGNOSIS — M069 Rheumatoid arthritis, unspecified: Secondary | ICD-10-CM | POA: Insufficient documentation

## 2023-12-07 DIAGNOSIS — Z79899 Other long term (current) drug therapy: Secondary | ICD-10-CM | POA: Insufficient documentation

## 2023-12-07 DIAGNOSIS — R768 Other specified abnormal immunological findings in serum: Secondary | ICD-10-CM | POA: Diagnosis not present

## 2023-12-07 DIAGNOSIS — M109 Gout, unspecified: Secondary | ICD-10-CM | POA: Diagnosis not present

## 2023-12-07 DIAGNOSIS — E039 Hypothyroidism, unspecified: Secondary | ICD-10-CM | POA: Diagnosis not present

## 2023-12-07 LAB — CMP (CANCER CENTER ONLY)
ALT: 19 U/L (ref 0–44)
AST: 24 U/L (ref 15–41)
Albumin: 3.9 g/dL (ref 3.5–5.0)
Alkaline Phosphatase: 91 U/L (ref 38–126)
Anion gap: 4 — ABNORMAL LOW (ref 5–15)
BUN: 23 mg/dL (ref 8–23)
CO2: 30 mmol/L (ref 22–32)
Calcium: 9 mg/dL (ref 8.9–10.3)
Chloride: 107 mmol/L (ref 98–111)
Creatinine: 0.83 mg/dL (ref 0.44–1.00)
GFR, Estimated: >60 mL/min (ref >60)
Glucose, Bld: 115 mg/dL — ABNORMAL HIGH (ref 70–99)
Potassium: 3.8 mmol/L (ref 3.5–5.1)
Sodium: 141 mmol/L (ref 135–145)
Total Bilirubin: 2 mg/dL — ABNORMAL HIGH (ref 0.0–1.2)
Total Protein: 6.1 g/dL — ABNORMAL LOW (ref 6.5–8.1)

## 2023-12-07 LAB — CBC WITH DIFFERENTIAL (CANCER CENTER ONLY)
Abs Immature Granulocytes: 0.01 10*3/uL (ref 0.00–0.07)
Basophils Absolute: 0 10*3/uL (ref 0.0–0.1)
Basophils Relative: 1 %
Eosinophils Absolute: 0.4 10*3/uL (ref 0.0–0.5)
Eosinophils Relative: 6 %
HCT: 36.3 % (ref 36.0–46.0)
Hemoglobin: 11.8 g/dL — ABNORMAL LOW (ref 12.0–15.0)
Immature Granulocytes: 0 %
Lymphocytes Relative: 34 %
Lymphs Abs: 2.2 10*3/uL (ref 0.7–4.0)
MCH: 29.5 pg (ref 26.0–34.0)
MCHC: 32.5 g/dL (ref 30.0–36.0)
MCV: 90.8 fL (ref 80.0–100.0)
Monocytes Absolute: 0.5 10*3/uL (ref 0.1–1.0)
Monocytes Relative: 7 %
Neutro Abs: 3.3 10*3/uL (ref 1.7–7.7)
Neutrophils Relative %: 52 %
Platelet Count: 285 10*3/uL (ref 150–400)
RBC: 4 MIL/uL (ref 3.87–5.11)
RDW: 14.1 % (ref 11.5–15.5)
WBC Count: 6.3 10*3/uL (ref 4.0–10.5)
nRBC: 0 % (ref 0.0–0.2)

## 2023-12-07 LAB — LACTATE DEHYDROGENASE: LDH: 168 U/L (ref 98–192)

## 2023-12-07 LAB — RETICULOCYTES
Immature Retic Fract: 9.1 % (ref 2.3–15.9)
RBC.: 4 MIL/uL (ref 3.87–5.11)
Retic Count, Absolute: 76 10*3/uL (ref 19.0–186.0)
Retic Ct Pct: 1.9 % (ref 0.4–3.1)

## 2023-12-07 NOTE — Progress Notes (Signed)
 [facility]  HEMATOLOGY CLINIC PROGRESS NOTE  PATIENT NAME: Becky Cox   MR#: [id number] DOB: 1951/04/09  Patient Care Team: Roanna Ezekiel NOVAK, MD as PCP - General (Internal Medicine) Bernie Guillermina BROCKS, MD as Attending Physician (Hematology)  Date of visit: 12/07/2023   ASSESSMENT & PLAN:   Becky Cox is a 73 y.o. 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  Cold agglutinins present Stable with intermittent fatigue. Hemoglobin improved compared to last visit from 11.2 to 11.8 currently. LDH normal. Haptoglobin and cold agglutinin titer pending.  -Last rituximab  infusion was in December 2023. -Repeat blood work in two months. -Consider repeat rituximab  infusions if cold agglutinin titer increases.  Decision on another rituximab  course will depend on hemoglobin trends and titer levels, not solely on cold agglutinin value. -She was advised to contact clinic if fatigue worsens. -Advise to maintain physical activity as tolerated. -Recommend pool temperature closer to 90 degrees to avoid cold exposure.    I spent a total of 30 minutes during this encounter with the patient including review of chart and various tests results, discussions about plan of care and coordination of care plan.  I reviewed lab results and outside records for this visit and discussed relevant results with the patient. Diagnosis, plan of care and treatment options were also discussed in detail with the patient. Opportunity provided to ask questions and answers provided to her apparent satisfaction. Provided instructions to call our clinic with any problems, questions or concerns prior to return visit. I recommended to continue follow-up with PCP and sub-specialists. She  verbalized understanding and agreed with the plan. No barriers to learning was detected.  Becky Patten, MD  12/07/2023 12:34 PM   [facility] - A DEPT OF [name] [name] [name] [address] Dept: [phone] Dept Fax: [phone]   CHIEF COMPLAINT/ REASON FOR VISIT:  Follow-up for cold agglutinin disease.  INTERVAL HISTORY:  Discussed the use of AI scribe software for clinical note transcription with the patient, who gave verbal consent to proceed.   Patient returns for routine follow-up. Over the past two months, she reports feeling 'pretty good' with intermittent low energy. She denies any major health concerns, including bleeding, chest pain, dyspnea, fevers, chills, night sweats, or infections.  The patient's hemoglobin has slightly improved since the last visit, from 11.2 to 11.8 (normal: 12). The patient last received rituximab  in December for the management of her cold agglutinin disease. She reports feeling 'a little tired' at times.  SUMMARY OF HEMATOLOGIC HISTORY:  She is being followed in our clinic for anemia and cold agglutinin disease.  Other comorbidities include hypothyroidism, obstructive sleep apnea, bilateral hearing loss, hypertension, rheumatoid arthritis, chronic diarrhea, hypertension, gout.   I reviewed the patient's records extensively and collaborated the history with the patient. Summary of her history is as follows:   March 22 2022:  Colonoscopy.  3 very small polyps   July 29, 2022: WBC 7.2 hemoglobin 10.3 MCV 93 platelet count 311; 45 segs 44 lymphs 5 monos 5 eos 1 basophil.  Specimen was prewarmed to 37 degrees to obtain results.  Technician suspected possibility of cold agglutinins/cryoglobulins present Chemistries notable for creatinine 0.79 albumin 3.9.  AST ALT and alk phos normal T. bili 2.3 direct bilirubin 0.6.  Reticulocyte count 2.8%   September 09 2022:  [facility] Was told years ago that she was anemic.   Has never taken oral iron.  No history of IV iron or PRBC's.   Does not take ASA.  Mainly takes Tylenol  for pain; rarely ibuprofen No pica to ice.  Uses CPAP every night   Social:  Divorced.  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  Tobacco none.  EtOH   [facility] Mother died 37 MI Father died 6 pneumonia Sister died 30 CVA, MS Sister alive 39 colon cancer Sister alive 59 Parkinson's disease Sister alive 65 HTN Sister alive 75 osteoarthritis Brother alive 103 TBI from motorcycle accident, HTN Brother died 34 CVA Brother died 81 colon cancer   WBC 9.6 hemoglobin 10.2 MCV 97 platelet count 346; 55 segs 35 lymphs 5 monos 4 eos 1 basophil.  Reticulocyte count 3.3% Coombs test complement positive/IgG negative.  Haptoglobin undetectable.  Cold agglutinin titer greater than 1:4096 Hemoglobin electrophoresis normal adult pattern. SPEP with IEP showed no paraprotein. IgG 624 IgA 121 IgM 74 Serum free kappa 25.1 lambda 28.3 with a kappa lambda 0.89 B12 500 folate 27.3 ferritin 242 Copper  128 zinc  97 CMP notable for T. bili of 3.3 glucose 102   September 23, 2022: Underwent bone marrow biopsy and aspirate Pathology revealed A minor CD10 positive B-cell population(1% of the lymphocytes) with kappa excess. Cytogenetics Normal female Karyotype  Neo Complrehensive Myeloid disorder panel normal     September 30, 2022: Scheduled follow-up for management of cold agglutinin disease.  Did not have a lot of discomfort following the bone marrow Reviewed results of labs and bone marrow results with patient.  Experiencing fatigue.  Having myalgias of legs.  Notes that her urine is dark   October 07 2022:  Rituxan  375 mg/m2  Hgb 8.8 October 14 2022  Rituxan  375 mg/m2.  Hgb 9.2  October 21 2022:  Rituxan  375 mg/m2  Hgb 10.1   October 28 2022:   Had a minor infusion rxn during first Rituxan  infusion but was able to complete it and  receive subsequent infusions.     December 09 2022:   Reviewed results of labs with patient and granddaughter.  Feels well.  Had COVID around Christmas for which she took Paxlovid.     WBC 5.5 hemoglobin 11.7 MCV 94.7 platelet count 289; 56 segs 32 lymphs 6 monos 5 eos 1 basophil reticulocyte count 1.6% Coombs positive for complement negative for IgG.  Haptoglobin 74 LDH 255 CMP notable for total protein 5.9   March 10 2023:   Feels well albeit a bit fatigued.  Reviewed results of labs with patient Hgb 11.4.  Cold agglutinin titer 1:1024 Haptoglobin 101  LDH 210   May 05 2023:    To retire at the end of the month but has plans to keep busy.  Reviewed results of labs with patient.  Feels well Hgb 11.4 Retic 2.3 Tbili 2.1 LDH 217    July 07 2023:  Scheduled follow-up for management of cold agglutinin disease.   Now retired.   Feels well.    Hg 12.1   T bili 1.7  LDH 184  I have reviewed the past medical history, past surgical history, social history and family history with the patient and they are unchanged from previous note.  ALLERGIES: She is allergic to rituximab .  MEDICATIONS:  Current Outpatient Medications  Medication Sig Dispense Refill   allopurinol (ZYLOPRIM) 300 MG tablet Take 1 tablet  by mouth daily.     amLODipine (NORVASC) 5 MG tablet Take 10 tablets by mouth daily.      Ascorbic Acid (VITAMIN C PO) Take by mouth.     atorvastatin (LIPITOR) 10 MG tablet Take 1 tablet by mouth daily.     diclofenac  Sodium (VOLTAREN ) 1 % GEL Apply topically.     fluticasone (FLONASE) 50 MCG/ACT nasal spray Place 2 sprays into the nose daily.     folic acid  (FOLVITE ) 1 MG tablet Take 1 mg by mouth daily.     ipratropium (ATROVENT ) 0.06 % nasal spray Place 2 sprays into both nostrils 4 (four) times daily. 15 mL 2   levothyroxine (SYNTHROID) 100 MCG tablet Take 100 mcg by mouth daily.     methotrexate (RHEUMATREX) 2.5 MG tablet take 6 tablets     montelukast (SINGULAIR) 10 MG tablet Take 1  tablet by mouth at bedtime.     Multiple Vitamin (MULTIVITAMIN PO) Take 1 tablet by mouth daily.     olmesartan (BENICAR) 20 MG tablet Take 20 mg by mouth daily.     Semaglutide -Weight Management (WEGOVY ) 0.25 MG/0.5ML SOAJ Inject 0.25 mg into the skin once a week. 2 mL 1   Semaglutide -Weight Management (WEGOVY ) 0.5 MG/0.5ML SOAJ Inject 0.5 mg into the skin once a week. 2 mL 0   Semaglutide -Weight Management (WEGOVY ) 1 MG/0.5ML SOAJ Inject 1 mg into the skin once a week. 2 mL 0   Semaglutide -Weight Management (WEGOVY ) 1 MG/0.5ML SOAJ Inject 1 mg into the skin once a week. 2 mL 0   triamterene-hydrochlorothiazide (MAXZIDE-25) 37.5-25 MG tablet Take 1 tablet by mouth daily.     No current facility-administered medications for this visit.     REVIEW OF SYSTEMS:    ROS  All other pertinent systems were reviewed with the patient and are negative.  PHYSICAL EXAMINATION:  ECOG PERFORMANCE STATUS: 1 - Symptomatic but completely ambulatory  Vitals:   12/07/23 1109  BP: (!) 120/53  Pulse: 79  Resp: 18  Temp: 98.1 F (36.7 C)  SpO2: 99%   Filed Weights   12/07/23 1109  Weight: 232 lb 1.6 oz (105.3 kg)    Physical Exam Constitutional:      General: She is not in acute distress.    Appearance: Normal appearance.  HENT:     Head: Normocephalic and atraumatic.  Eyes:     General: No scleral icterus.    Conjunctiva/sclera: Conjunctivae normal.  Cardiovascular:     Rate and Rhythm: Normal rate and regular rhythm.     Heart sounds: Normal heart sounds.  Pulmonary:     Effort: Pulmonary effort is normal.     Breath sounds: Normal breath sounds.  Abdominal:     General: There is no distension.  Musculoskeletal:     Right lower leg: No edema.     Left lower leg: No edema.  Neurological:     General: No focal deficit present.     Mental Status: She is alert and oriented to person, place, and time.  Psychiatric:        Mood and Affect: Mood normal.        Behavior: Behavior  normal.        Thought Content: Thought content normal.     LABORATORY DATA:   I have reviewed the data as listed.  Results for orders placed or performed in visit on 12/07/23  Reticulocytes  Result Value Ref Range   Retic Ct Pct 1.9 0.4 - 3.1 %  RBC. 4.00 3.87 - 5.11 MIL/uL   Retic Count, Absolute 76.0 19.0 - 186.0 K/uL   Immature Retic Fract 9.1 2.3 - 15.9 %  Lactate dehydrogenase  Result Value Ref Range   LDH 168 98 - 192 U/L  CMP (Cancer Center only)  Result Value Ref Range   Sodium 141 135 - 145 mmol/L   Potassium 3.8 3.5 - 5.1 mmol/L   Chloride 107 98 - 111 mmol/L   CO2 30 22 - 32 mmol/L   Glucose, Bld 115 (H) 70 - 99 mg/dL   BUN 23 8 - 23 mg/dL   Creatinine 9.16 9.55 - 1.00 mg/dL   Calcium 9.0 8.9 - 89.6 mg/dL   Total Protein 6.1 (L) 6.5 - 8.1 g/dL   Albumin 3.9 3.5 - 5.0 g/dL   AST 24 15 - 41 U/L   ALT 19 0 - 44 U/L   Alkaline Phosphatase 91 38 - 126 U/L   Total Bilirubin 2.0 (H) 0.0 - 1.2 mg/dL   GFR, Estimated >39 >39 mL/min   Anion gap 4 (L) 5 - 15  CBC with Differential (Cancer Center Only)  Result Value Ref Range   WBC Count 6.3 4.0 - 10.5 K/uL   RBC 4.00 3.87 - 5.11 MIL/uL   Hemoglobin 11.8 (L) 12.0 - 15.0 g/dL   HCT 63.6 63.9 - 53.9 %   MCV 90.8 80.0 - 100.0 fL   MCH 29.5 26.0 - 34.0 pg   MCHC 32.5 30.0 - 36.0 g/dL   RDW 85.8 88.4 - 84.4 %   Platelet Count 285 150 - 400 K/uL   nRBC 0.0 0.0 - 0.2 %   Neutrophils Relative % 52 %   Neutro Abs 3.3 1.7 - 7.7 K/uL   Lymphocytes Relative 34 %   Lymphs Abs 2.2 0.7 - 4.0 K/uL   Monocytes Relative 7 %   Monocytes Absolute 0.5 0.1 - 1.0 K/uL   Eosinophils Relative 6 %   Eosinophils Absolute 0.4 0.0 - 0.5 K/uL   Basophils Relative 1 %   Basophils Absolute 0.0 0.0 - 0.1 K/uL   Immature Granulocytes 0 %   Abs Immature Granulocytes 0.01 0.00 - 0.07 K/uL       RADIOGRAPHIC STUDIES:  No recent pertinent imaging studies available to review.  Orders Placed This Encounter  Procedures   CBC with  Differential/Platelet    Standing Status:   Standing    Number of Occurrences:   5    Expiration Date:   12/06/2024   Comprehensive metabolic panel    Standing Status:   Standing    Number of Occurrences:   5    Expiration Date:   12/06/2024   Lactate dehydrogenase    Standing Status:   Standing    Number of Occurrences:   5    Expiration Date:   12/06/2024   Haptoglobin    Standing Status:   Standing    Number of Occurrences:   5    Expiration Date:   12/06/2024   Reticulocytes    Standing Status:   Standing    Number of Occurrences:   5    Expiration Date:   12/06/2024   Cold agglutinin titer    Standing Status:   Standing    Number of Occurrences:   5    Expiration Date:   12/06/2024     Future Appointments  Date Time Provider Department Center  02/02/2024 10:30 AM [facility] None  02/02/2024 11:00 AM Karlyn Glasco,  MD [facility] None  05/21/2024  3:00 PM [facility] DEXA 1 [facility]     This document was completed utilizing speech recognition software. Grammatical errors, random word insertions, pronoun errors, and incomplete sentences are an occasional consequence of this system due to software limitations, ambient noise, and hardware issues. Any formal questions or concerns about the content, text or information contained within the body of this dictation should be directly addressed to the provider for clarification.

## 2023-12-07 NOTE — Assessment & Plan Note (Addendum)
 Stable with intermittent fatigue. Hemoglobin improved compared to last visit from 11.2 to 11.8 currently. LDH normal. Haptoglobin and cold agglutinin titer pending.  -Last rituximab  infusion was in December 2023. -Repeat blood work in two months. -Consider repeat rituximab  infusions if cold agglutinin titer increases.  Decision on another rituximab  course will depend on hemoglobin trends and titer levels, not solely on cold agglutinin value. -She was advised to contact clinic if fatigue worsens. -Advise to maintain physical activity as tolerated. -Recommend pool temperature closer to 90 degrees to avoid cold exposure.

## 2023-12-09 LAB — HAPTOGLOBIN: Haptoglobin: 149 mg/dL (ref 42–346)

## 2023-12-11 LAB — COLD AGGLUTININ TITER: Cold Agglutinin Titer: 1:64 {titer} — ABNORMAL HIGH

## 2024-01-09 DIAGNOSIS — M25551 Pain in right hip: Secondary | ICD-10-CM | POA: Insufficient documentation

## 2024-01-10 ENCOUNTER — Other Ambulatory Visit: Payer: Self-pay | Admitting: Internal Medicine

## 2024-01-10 ENCOUNTER — Ambulatory Visit
Admission: RE | Admit: 2024-01-10 | Discharge: 2024-01-10 | Disposition: A | Discharge status: Home / Self Care | Payer: Medicare PPO | Source: Ambulatory Visit | Attending: Internal Medicine | Admitting: Internal Medicine

## 2024-01-10 DIAGNOSIS — M25551 Pain in right hip: Secondary | ICD-10-CM

## 2024-01-15 ENCOUNTER — Other Ambulatory Visit (HOSPITAL_BASED_OUTPATIENT_CLINIC_OR_DEPARTMENT_OTHER): Payer: Self-pay

## 2024-02-02 ENCOUNTER — Inpatient Hospital Stay: Payer: Medicare PPO | Admitting: Oncology

## 2024-02-02 ENCOUNTER — Encounter: Payer: Self-pay | Admitting: Oncology

## 2024-02-02 ENCOUNTER — Encounter: Payer: Self-pay | Admitting: Family Medicine

## 2024-02-02 ENCOUNTER — Inpatient Hospital Stay: Payer: Medicare PPO | Attending: Oncology

## 2024-02-02 VITALS — BP 135/65 | HR 69 | Temp 97.7°F | Resp 17 | Wt 232.9 lb

## 2024-02-02 DIAGNOSIS — M109 Gout, unspecified: Secondary | ICD-10-CM | POA: Diagnosis not present

## 2024-02-02 DIAGNOSIS — G4733 Obstructive sleep apnea (adult) (pediatric): Secondary | ICD-10-CM | POA: Diagnosis not present

## 2024-02-02 DIAGNOSIS — I10 Essential (primary) hypertension: Secondary | ICD-10-CM | POA: Insufficient documentation

## 2024-02-02 DIAGNOSIS — Z79899 Other long term (current) drug therapy: Secondary | ICD-10-CM | POA: Diagnosis not present

## 2024-02-02 DIAGNOSIS — H9193 Unspecified hearing loss, bilateral: Secondary | ICD-10-CM | POA: Insufficient documentation

## 2024-02-02 DIAGNOSIS — E039 Hypothyroidism, unspecified: Secondary | ICD-10-CM | POA: Insufficient documentation

## 2024-02-02 DIAGNOSIS — M069 Rheumatoid arthritis, unspecified: Secondary | ICD-10-CM | POA: Diagnosis not present

## 2024-02-02 DIAGNOSIS — R197 Diarrhea, unspecified: Secondary | ICD-10-CM | POA: Insufficient documentation

## 2024-02-02 DIAGNOSIS — D5912 Cold autoimmune hemolytic anemia: Secondary | ICD-10-CM | POA: Diagnosis present

## 2024-02-02 DIAGNOSIS — R768 Other specified abnormal immunological findings in serum: Secondary | ICD-10-CM

## 2024-02-02 DIAGNOSIS — Z79890 Hormone replacement therapy (postmenopausal): Secondary | ICD-10-CM | POA: Insufficient documentation

## 2024-02-02 LAB — COMPREHENSIVE METABOLIC PANEL
ALT: 27 U/L (ref 0–44)
AST: 28 U/L (ref 15–41)
Albumin: 4 g/dL (ref 3.5–5.0)
Alkaline Phosphatase: 98 U/L (ref 38–126)
Anion gap: 6 (ref 5–15)
BUN: 22 mg/dL (ref 8–23)
CO2: 27 mmol/L (ref 22–32)
Calcium: 9.2 mg/dL (ref 8.9–10.3)
Chloride: 109 mmol/L (ref 98–111)
Creatinine, Ser: 0.72 mg/dL (ref 0.44–1.00)
GFR, Estimated: >60 mL/min (ref >60)
Glucose, Bld: 90 mg/dL (ref 70–99)
Potassium: 3.6 mmol/L (ref 3.5–5.1)
Sodium: 142 mmol/L (ref 135–145)
Total Bilirubin: 1.7 mg/dL — ABNORMAL HIGH (ref 0.0–1.2)
Total Protein: 6.1 g/dL — ABNORMAL LOW (ref 6.5–8.1)

## 2024-02-02 LAB — CBC WITH DIFFERENTIAL/PLATELET
Abs Immature Granulocytes: 0.02 10*3/uL (ref 0.00–0.07)
Basophils Absolute: 0.1 10*3/uL (ref 0.0–0.1)
Basophils Relative: 1 %
Eosinophils Absolute: 0.6 10*3/uL — ABNORMAL HIGH (ref 0.0–0.5)
Eosinophils Relative: 8 %
HCT: 32.6 % — ABNORMAL LOW (ref 36.0–46.0)
Hemoglobin: 11.6 g/dL — ABNORMAL LOW (ref 12.0–15.0)
Immature Granulocytes: 0 %
Lymphocytes Relative: 35 %
Lymphs Abs: 2.4 10*3/uL (ref 0.7–4.0)
MCH: 33.1 pg (ref 26.0–34.0)
MCHC: 35.6 g/dL (ref 30.0–36.0)
MCV: 93.1 fL (ref 80.0–100.0)
Monocytes Absolute: 0.4 10*3/uL (ref 0.1–1.0)
Monocytes Relative: 5 %
Neutro Abs: 3.3 10*3/uL (ref 1.7–7.7)
Neutrophils Relative %: 51 %
Platelets: 289 10*3/uL (ref 150–400)
RBC: 3.5 MIL/uL — ABNORMAL LOW (ref 3.87–5.11)
RDW: 17.6 % — ABNORMAL HIGH (ref 11.5–15.5)
WBC: 6.7 10*3/uL (ref 4.0–10.5)
nRBC: 0 % (ref 0.0–0.2)

## 2024-02-02 LAB — RETICULOCYTES
Immature Retic Fract: 8.6 % (ref 2.3–15.9)
RBC.: 3.3 MIL/uL — ABNORMAL LOW (ref 3.87–5.11)
Retic Count, Absolute: 61.1 10*3/uL (ref 19.0–186.0)
Retic Ct Pct: 1.9 % (ref 0.4–3.1)

## 2024-02-02 LAB — LACTATE DEHYDROGENASE: LDH: 191 U/L (ref 98–192)

## 2024-02-02 NOTE — Assessment & Plan Note (Addendum)
 Stable with intermittent fatigue. Hemoglobin stable at 11.6 currently. LDH normal. Haptoglobin was within normal limits on last visit.  Repeat value is pending from today.  Cold agglutinin titer pending from today but it was better at 1:64 on last visit. -Last rituximab infusion was in December 2023. -Repeat blood work in two months. -Consider repeat rituximab infusions if cold agglutinin titer increases.  Decision on another rituximab course will depend on hemoglobin trends and titer levels, not solely on cold agglutinin value. -She was advised to contact clinic if fatigue worsens. -Advise to maintain physical activity as tolerated. -Recommend pool temperature closer to 90 degrees to avoid cold exposure.

## 2024-02-02 NOTE — Progress Notes (Signed)
 [facility]  HEMATOLOGY CLINIC PROGRESS NOTE  PATIENT NAME: Becky Cox   MR#: [id number] DOB: 08-23-1951  Patient Care Team: Har[place], MD as PCP - General (Internal Medicine) Loni Muse, MD (Inactive) as Attending Physician (Hematology)  Date of visit: 02/02/2024   ASSESSMENT & PLAN:   Zamarah Ullmer is a 73 y.o. 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  Cold agglutinins present Stable with intermittent fatigue. Hemoglobin stable at 11.6 currently. LDH normal. Haptoglobin was within normal limits on last visit.  Repeat value is pending from today.  Cold agglutinin titer pending from today but it was better at 1:64 on last visit. -Last rituximab infusion was in December 2023. -Repeat blood work in two months. -Consider repeat rituximab infusions if cold agglutinin titer increases.  Decision on another rituximab course will depend on hemoglobin trends and titer levels, not solely on cold agglutinin value. -She was advised to contact clinic if fatigue worsens. -Advise to maintain physical activity as tolerated. -Recommend pool temperature closer to 90 degrees to avoid cold exposure.     I spent a total of 30 minutes during this encounter with the patient including review of chart and various tests results, discussions about plan of care and coordination of care plan.  I reviewed lab results and outside records for this visit and discussed relevant results with the patient. Diagnosis, plan of care and treatment options were also discussed in detail with the patient. Opportunity provided to ask questions and answers provided to her apparent satisfaction. Provided instructions to call our clinic with any problems, questions or concerns  prior to return visit. I recommended to continue follow-up with PCP and sub-specialists. She verbalized understanding and agreed with the plan. No barriers to learning was detected.  Meryl Crutch, MD  02/02/2024 2:19 PM  [facility] - A DEPT [facility] [address] [name] [address] Dept: [phone] Dept Fax: [phone]   CHIEF COMPLAINT/ REASON FOR VISIT:  Follow-up for cold agglutinin disease.  INTERVAL HISTORY:  Discussed the use of AI scribe software for clinical note transcription with the patient, who gave verbal consent to proceed.   She reports feeling cold at times, but otherwise has no new or worsening symptoms. She denies fevers, chills, and night sweats. She has been maintaining her weight and eating well. She has been dealing with arthritis, but otherwise feels well. She had labs done about a week ago by her primary doctor, and some of those labs were repeated today.  The patient last received rituximab in December 2023 for the management of her cold agglutinin disease.  SUMMARY OF HEMATOLOGIC HISTORY:  She is being followed in our clinic for anemia and cold agglutinin disease.  Other comorbidities include hypothyroidism, obstructive sleep apnea, bilateral hearing loss, hypertension, rheumatoid arthritis, chronic diarrhea, hypertension, gout.   I reviewed the patient's records extensively and collaborated the history with the patient. Summary of her history is as follows:   March 22 2022:  Colonoscopy.  3 very small polyps   July 29, 2022: WBC 7.2 hemoglobin 10.3 MCV 93 platelet count 311; 45 segs 44 lymphs 5 monos 5 eos 1 basophil.  Specimen was prewarmed to 37 degrees to obtain results.  Technician suspected possibility of cold agglutinins/cryoglobulins  present Chemistries notable for creatinine 0.79 albumin 3.9.  AST ALT and alk phos normal T. bili 2.3 direct bilirubin 0.6.  Reticulocyte  count 2.8%   September 09 2022:  [facility] Hemat[facility] Consult Was told years ago that she was anemic.   Has never taken oral iron.  No history of IV iron or PRBC's.   Does not take ASA.  Mainly takes Tylenol for pain; rarely ibuprofen No pica to ice.  Uses CPAP every night   Social:  Divorced.  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  Tobacco none.  EtOH   [facility] Mother died 25 MI Father died 56 pneumonia Sister died 28 CVA, MS Sister alive 46 colon cancer Sister alive 66 Parkinson's disease Sister alive 32 HTN Sister alive 69 osteoarthritis Brother alive 49 TBI from motorcycle accident, HTN Brother died 25 CVA Brother died 18 colon cancer   WBC 9.6 hemoglobin 10.2 MCV 97 platelet count 346; 55 segs 35 lymphs 5 monos 4 eos 1 basophil.  Reticulocyte count 3.3% Coombs test complement positive/IgG negative.  Haptoglobin undetectable.  Cold agglutinin titer greater than 1:4096 Hemoglobin electrophoresis normal adult pattern. SPEP with IEP showed no paraprotein. IgG 624 IgA 121 IgM 74 Serum free kappa 25.1 lambda 28.3 with a kappa lambda 0.89 B12 500 folate 27.3 ferritin 242 Copper 128 zinc 97 CMP notable for T. bili of 3.3 glucose 102   September 23, 2022: Underwent bone marrow biopsy and aspirate Pathology revealed A minor CD10 positive B-cell population(1% of the lymphocytes) with kappa excess. Cytogenetics Normal female Karyotype  Neo Complrehensive Myeloid disorder panel normal     September 30, 2022: Scheduled follow-up for management of cold agglutinin disease.  Did not have a lot of discomfort following the bone marrow Reviewed results of labs and bone marrow results with patient.  Experiencing fatigue.  Having myalgias of legs.  Notes that her urine is dark   October 07 2022:  Rituxan 375 mg/m2  Hgb 8.8 October 14 2022  Rituxan 375 mg/m2.  Hgb 9.2  October 21 2022:  Rituxan 375 mg/m2  Hgb 10.1   October 28 2022:   Had a minor infusion rxn during first Rituxan  infusion but was able to complete it and receive subsequent infusions.     December 09 2022:   Reviewed results of labs with patient and granddaughter.  Feels well.  Had COVID around Christmas for which she took Paxlovid.     WBC 5.5 hemoglobin 11.7 MCV 94.7 platelet count 289; 56 segs 32 lymphs 6 monos 5 eos 1 basophil reticulocyte count 1.6% Coombs positive for complement negative for IgG.  Haptoglobin 74 LDH 255 CMP notable for total protein 5.9   March 10 2023:   Feels well albeit a bit fatigued.  Reviewed results of labs with patient Hgb 11.4.  Cold agglutinin titer 1:1024 Haptoglobin 101  LDH 210   May 05 2023:    To retire at the end of the month but has plans to keep busy.  Reviewed results of labs with patient.  Feels well Hgb 11.4 Retic 2.3 Tbili 2.1 LDH 217    July 07 2023:  Scheduled follow-up for management of cold agglutinin disease.   Now retired.   Feels well.    Hg 12.1   T bili 1.7  LDH 184  I have reviewed the past medical history, past surgical history, social history and family history with the patient and they are unchanged from previous note.  ALLERGIES: She is allergic to  rituximab.  MEDICATIONS:  Current Outpatient Medications  Medication Sig Dispense Refill   allopurinol (ZYLOPRIM) 300 MG tablet Take 1 tablet by mouth daily.     amLODipine (NORVASC) 5 MG tablet Take 10 tablets by mouth daily.      Ascorbic Acid (VITAMIN C PO) Take by mouth.     atorvastatin (LIPITOR) 10 MG tablet Take 1 tablet by mouth daily.     diclofenac Sodium (VOLTAREN) 1 % GEL Apply topically.     fluticasone (FLONASE) 50 MCG/ACT nasal spray Place 2 sprays into the nose daily.     folic acid (FOLVITE) 1 MG tablet Take 1 mg by mouth daily.     levothyroxine (SYNTHROID) 100 MCG tablet Take 100 mcg by mouth daily.     methotrexate (RHEUMATREX) 2.5 MG tablet take 6 tablets     Multiple Vitamin (MULTIVITAMIN PO) Take 1 tablet by mouth daily.     olmesartan (BENICAR) 20 MG tablet Take 20  mg by mouth daily.     triamterene-hydrochlorothiazide (MAXZIDE-25) 37.5-25 MG tablet Take 1 tablet by mouth daily.     ipratropium (ATROVENT) 0.06 % nasal spray Place 2 sprays into both nostrils 4 (four) times daily. (Patient not taking: Reported on 02/02/2024) 15 mL 2   montelukast (SINGULAIR) 10 MG tablet Take 1 tablet by mouth at bedtime. (Patient not taking: Reported on 02/02/2024)     No current facility-administered medications for this visit.     REVIEW OF SYSTEMS:    ROS  All other pertinent systems were reviewed with the patient and are negative.  PHYSICAL EXAMINATION:   Onc Performance Status - 02/02/24 1000       ECOG Perf Status   ECOG Perf Status Fully active, able to carry on all pre-disease performance without restriction      KPS SCALE   KPS % SCORE Normal, no compliants, no evidence of disease              Vitals:   02/02/24 1051  BP: 135/65  Pulse: 69  Resp: 17  Temp: 97.7 F (36.5 C)  SpO2: 98%   Filed Weights   02/02/24 1051  Weight: 232 lb 14.4 oz (105.6 kg)    Physical Exam Constitutional:      General: She is not in acute distress.    Appearance: Normal appearance.  HENT:     Head: Normocephalic and atraumatic.  Eyes:     General: No scleral icterus.    Conjunctiva/sclera: Conjunctivae normal.  Cardiovascular:     Rate and Rhythm: Normal rate and regular rhythm.     Heart sounds: Normal heart sounds.  Pulmonary:     Effort: Pulmonary effort is normal.     Breath sounds: Normal breath sounds.  Abdominal:     General: There is no distension.  Musculoskeletal:     Right lower leg: No edema.     Left lower leg: No edema.  Neurological:     General: No focal deficit present.     Mental Status: She is alert and oriented to person, place, and time.  Psychiatric:        Mood and Affect: Mood normal.        Behavior: Behavior normal.        Thought Content: Thought content normal.     LABORATORY DATA:   I have reviewed the data  as listed.  Results for orders placed or performed in visit on 02/02/24  Reticulocytes  Result Value Ref Range   Retic  Ct Pct 1.9 0.4 - 3.1 %   RBC. 3.30 (L) 3.87 - 5.11 MIL/uL   Retic Count, Absolute 61.1 19.0 - 186.0 K/uL   Immature Retic Fract 8.6 2.3 - 15.9 %  Lactate dehydrogenase  Result Value Ref Range   LDH 191 98 - 192 U/L  Comprehensive metabolic panel  Result Value Ref Range   Sodium 142 135 - 145 mmol/L   Potassium 3.6 3.5 - 5.1 mmol/L   Chloride 109 98 - 111 mmol/L   CO2 27 22 - 32 mmol/L   Glucose, Bld 90 70 - 99 mg/dL   BUN 22 8 - 23 mg/dL   Creatinine, Ser 0.27 0.44 - 1.00 mg/dL   Calcium 9.2 8.9 - 25.3 mg/dL   Total Protein 6.1 (L) 6.5 - 8.1 g/dL   Albumin 4.0 3.5 - 5.0 g/dL   AST 28 15 - 41 U/L   ALT 27 0 - 44 U/L   Alkaline Phosphatase 98 38 - 126 U/L   Total Bilirubin 1.7 (H) 0.0 - 1.2 mg/dL   GFR, Estimated >66 >44 mL/min   Anion gap 6 5 - 15  CBC with Differential/Platelet  Result Value Ref Range   WBC 6.7 4.0 - 10.5 K/uL   RBC 3.50 (L) 3.87 - 5.11 MIL/uL   Hemoglobin 11.6 (L) 12.0 - 15.0 g/dL   HCT 03.4 (L) 74.2 - 59.5 %   MCV 93.1 80.0 - 100.0 fL   MCH 33.1 26.0 - 34.0 pg   MCHC 35.6 30.0 - 36.0 g/dL   RDW 63.8 (H) 75.6 - 43.3 %   Platelets 289 150 - 400 K/uL   nRBC 0.0 0.0 - 0.2 %   Neutrophils Relative % 51 %   Neutro Abs 3.3 1.7 - 7.7 K/uL   Lymphocytes Relative 35 %   Lymphs Abs 2.4 0.7 - 4.0 K/uL   Monocytes Relative 5 %   Monocytes Absolute 0.4 0.1 - 1.0 K/uL   Eosinophils Relative 8 %   Eosinophils Absolute 0.6 (H) 0.0 - 0.5 K/uL   Basophils Relative 1 %   Basophils Absolute 0.1 0.0 - 0.1 K/uL   Immature Granulocytes 0 %   Abs Immature Granulocytes 0.02 0.00 - 0.07 K/uL     RADIOGRAPHIC STUDIES:  No recent pertinent imaging studies available to review.  Orders Placed This Encounter  Procedures   CBC with Differential (Cancer Center Only)    Standing Status:   Future    Expected Date:   04/03/2024    Expiration Date:    02/01/2025   CMP (Cancer Center only)    Standing Status:   Future    Expected Date:   04/03/2024    Expiration Date:   02/01/2025   Lactate dehydrogenase    Standing Status:   Future    Expected Date:   04/03/2024    Expiration Date:   02/01/2025   Haptoglobin    Standing Status:   Future    Expected Date:   04/03/2024    Expiration Date:   02/01/2025   Cold agglutinin titer    Standing Status:   Future    Expected Date:   04/03/2024    Expiration Date:   02/01/2025     Future Appointments  Date Time Provider Department Center  04/05/2024 10:30 AM [facility] None  04/05/2024 11:00 AM Jorel Gravlin, MD [facility] None  05/21/2024  3:00 PM [facility] DX DEXA 1 [facility]  08/06/2024  2:30 PM Terri Piedra, DO  [facility] None     This document was completed utilizing speech recognition software. Grammatical errors, random word insertions, pronoun errors, and incomplete sentences are an occasional consequence of this system due to software limitations, ambient noise, and hardware issues. Any formal questions or concerns about the content, text or information contained within the body of this dictation should be directly addressed to the provider for clarification.

## 2024-02-03 LAB — HAPTOGLOBIN: Haptoglobin: 128 mg/dL (ref 42–346)

## 2024-02-05 LAB — COLD AGGLUTININ TITER: Cold Agglutinin Titer: 1:128 {titer} — ABNORMAL HIGH

## 2024-02-20 ENCOUNTER — Other Ambulatory Visit: Payer: Self-pay

## 2024-02-20 ENCOUNTER — Ambulatory Visit (HOSPITAL_BASED_OUTPATIENT_CLINIC_OR_DEPARTMENT_OTHER): Attending: Family Medicine | Admitting: Physical Therapy

## 2024-02-20 DIAGNOSIS — M6281 Muscle weakness (generalized): Secondary | ICD-10-CM | POA: Insufficient documentation

## 2024-02-20 DIAGNOSIS — M25551 Pain in right hip: Secondary | ICD-10-CM | POA: Diagnosis present

## 2024-02-20 DIAGNOSIS — R262 Difficulty in walking, not elsewhere classified: Secondary | ICD-10-CM | POA: Diagnosis present

## 2024-02-20 NOTE — Therapy (Signed)
 OUTPATIENT PHYSICAL THERAPY LOWER EXTREMITY EVALUATION   Patient Name: Becky Cox MRN: [id number] DOB:August 21, 1951, 73 y.o., female Today's Date: 02/21/2024  END OF SESSION:  PT End of Session - 02/20/24 1542     Visit Number 1    Number of Visits 12    Date for PT Re-Evaluation 04/02/24    Authorization Type [employer] MCR    PT Start Time 1539    PT Stop Time 1629    PT Time Calculation (min) 50 min    Activity Tolerance Patient tolerated treatment well    Behavior During Therapy WFL for tasks assessed/performed             Past Medical History:  Diagnosis Date   Fibroid    Hypertension    Thyroid disease    Nodules   Past Surgical History:  Procedure Laterality Date   ABDOMINAL HYSTERECTOMY  1996   ANKLE SURGERY  04/2011   BONE SPURS IN RIGHT ANKLE   BREAST BIOPSY Left 2019   benign   BREAST BIOPSY Right 2021   FOOT SURGERY  2007 2008   KNEE SURGERY     Arthroscopic   MOLE REMOVAL  2009   BENIGN   THYROID CYST EXCISION     2001   Patient Active Problem List   Diagnosis Date Noted   Coordination of complex care 10/03/2022   History of bone marrow biopsy 10/03/2022   Rheumatoid arthritis (HCC) 10/03/2022   Lymphoproliferative disorder (HCC) 09/23/2022   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  She switched over to [facility] from the [facility].  Pt began taking land and aquatics classes and noticed R sided groin pain.  Pt saw MD and informed her  she was not a surgical candidate.  He ordered PT and an injection.  Pt had an US guided injection on Friday and reports minimally improved sx's.  Pt has increased pain with turning to get out of a chair including trying to get out of a restaurant booth.  She has occasional pain with car transfers.  She has pain with ambulation and is limited with ambulation distance.  Pt has a little pain with stairs.  Pt unable to cross legs and has difficulty donning socks.     Pt is a member at [employer] and performs aquatic and land based exercise classes 3x/wk.  Pt has increased pain with exercises.    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 No  LIVING ENVIRONMENT: Lives with: lives alone Lives in: 1 story home Stairs:  3  steps and 2 rails   OCCUPATION: Pt is retired.  PLOF: Independent.  Pt performs stairs with a step to gait pattern.  PATIENT GOALS: improved walking, performing stairs, and pain.  Feeling good after she exercises.    OBJECTIVE:  Note: Objective measures were completed at Evaluation unless otherwise noted.  DIAGNOSTIC FINDINGS:  Hip x ray: FINDINGS: Osteoarthrosis with significant narrowing of the superolateral compartment of the joint space without evidence of fracture or other bony abnormalities.   IMPRESSION: Osteoarthrosis.  PATIENT SURVEYS:  LEFS 39/80  COGNITION: Overall cognitive status: Within functional limits for tasks assessed     PALPATION: Pt has soft tissue tightness in R glute.  Pt has tenderness with  palpation lateral R hip, GT, and ASIS.  Pt had no tenderness with palpation of L hip.    LOWER EXTREMITY ROM:  Active ROM Right eval Left eval  Hip flexion    Hip extension    Hip abduction    Hip adduction    Hip internal rotation 17 deg seated 32 deg seated  Hip external rotation 24 deg seated 19 deg seated  Knee flexion    Knee extension    Ankle dorsiflexion    Ankle plantarflexion    Ankle inversion    Ankle eversion     (Blank rows = not tested)  LOWER EXTREMITY MMT:  MMT Right eval Left eval  Hip flexion 5/5 5/5  Hip extension    Hip abduction 4/5 4/5  Hip adduction    Hip internal rotation 5/5 w/n available ROM 5/5  Hip external rotation 5/5 4/5 w/n available ROM  Knee flexion    Knee extension 4+/5 5/5  Ankle dorsiflexion    Ankle plantarflexion    Ankle inversion    Ankle eversion     (Blank rows = not tested)   FUNCTIONAL TESTS:  5x STS test:  14.4 sec.  decreased control with lowering to seat.  Pt used hands on thighs for 2 reps.  No increased pain.  GAIT: Assistive device utilized: None Level of assistance: Complete Independence Comments: decreased step length, decreased hip extension bilat                                                                                                                                TREATMENT DATE:   Pt performed supine bridge 2x10 and supine clams with RTB 2x10.  Pt reports having L knee pain with supine clams; PT did not include in HEP.  Pt received a HEP handout and was educated in correct form and appropriate frequency.   PATIENT EDUCATION:  Education details: dx, objective findings, relevant anatomy, HEP, rationale of interventions, and what to expect next treatment. Person educated: Patient Education method: Explanation, Demonstration, Tactile cues, Verbal cues, and Handouts Education comprehension: verbalized understanding, returned demonstration, verbal cues required, tactile cues required, and needs  further education  HOME EXERCISE PROGRAM: Access Code: [email or web address] URL: [email or web address] Date: 02/20/2024 Prepared  by: Aaron Edelman  Exercises - Supine Bridge  - 1 x daily - 6-7 x weekly - 2 sets - 10 reps  ASSESSMENT:  CLINICAL IMPRESSION: Patient is a 73 y.o. female with a dx of R hip pain.  Pt had x ray findings of osteoarthrosis.  Pt reports minimal improved sx's after recently having an US guided injection.  Pt has increased pain with functional mobility including transfers and ambulation.  Pt is limited with ambulation distance and has a little pain with stairs.  She has difficulty with donning socks.  Pt performs aquatic and land based exercise classes though has increased pain with exercises.  Pt has weakness in bilat LE's.  She should benefit from skilled PT to address impairments and improve overall function.     OBJECTIVE IMPAIRMENTS: Abnormal gait, decreased activity tolerance, decreased endurance, decreased mobility, difficulty walking, decreased ROM, decreased strength, hypomobility, and pain.   ACTIVITY LIMITATIONS: standing, squatting, stairs, transfers, dressing, and locomotion level  PARTICIPATION LIMITATIONS: community activity and workout activities  PERSONAL FACTORS: 3+ comorbidities: RA, L knee OA, R knee and ankle surgery  are also affecting patient's functional outcome.   REHAB POTENTIAL: Good  CLINICAL DECISION MAKING: Stable/uncomplicated  EVALUATION COMPLEXITY: Low   GOALS:  SHORT TERM GOALS: Target date: 03/12/2024  Pt will be independent and compliant with HEP for improved pain, strength, and function.  Baseline: Goal status: INITIAL  2.  Pt will demo improved quality of gait including increased step length and increasedWb'ing thru R LE for improved performance of community ambulation.  Baseline:  Goal status: INITIAL  3.  Pt will demo a significant improvement in self perceived disability with LEFS improving by at least 9  points.   Baseline:  Goal status: INITIAL Target date:  03/19/2024  4.  Pt will report at least a 30% improvement in pain and sx's overall.  Baseline:  Goal status: INITIAL    LONG TERM GOALS: Target date: 04/02/2024  Pt will demo improved strength to 5/5 in R hip abduction and knee extension for improved performance of and tolerance with functional mobility.  Baseline:  Goal status: INITIAL  2.  Pt will report she is able to ambulate extended community distance without significant pain and difficulty.   Baseline:  Goal status: INITIAL  3.  Pt will be able to perform all of her normal daily transfers without significant difficulty and pain.  Baseline:  Goal status: INITIAL  4.  Pt will have improved tolerance with exercise classes and also demonstrate understanding in modifying/restricting certain movements and exercises in classes in order to have a consistent exercise routine without exacerbating hip pain.   Baseline:  Goal status: INITIAL   PLAN:  PT FREQUENCY: 2x/week  PT DURATION: 6 weeks  PLANNED INTERVENTIONS:  [id number]- PT Re-evaluation, 97110-Therapeutic exercises, 97530- Therapeutic activity, [id number]- Neuromuscular re-education, 97535- Self Care, [id number]- Manual therapy, [id number]- Gait training, [phone]- Aquatic Therapy, [phone]- Electrical stimulation (unattended), [id number]- Electrical stimulation (manual), [id number]- Ultrasound, Patient/Family education, Balance training, Stair training, Taping, Dry Needling, Cryotherapy, and Moist heat  PLAN FOR NEXT SESSION: STM and rolling to glute.  LE strengthening per pt tolerance.  Assess HS, piriformis, and hip flexor flexibility.  Referring diagnosis? M25.551 (ICD-10-CM) - Right hip pain Treatment diagnosis? (if different than referring diagnosis)   Pain in right hip  Muscle weakness (generalized)  Difficulty in walking, not elsewhere classified  What was this (referring dx) caused by? []  Surgery []  Fall []  Ongoing issue [x]   Arthritis []  Other: ____________  Laterality: [x]  Rt []  Lt []  Both  Check all possible CPT codes:  *CHOOSE 10 OR LESS*    See Planned Interventions listed in the Plan section of the Evaluation.     Audie Clear III PT, DPT 02/21/24 10:19 PM

## 2024-02-21 ENCOUNTER — Encounter (HOSPITAL_BASED_OUTPATIENT_CLINIC_OR_DEPARTMENT_OTHER): Payer: Self-pay | Admitting: Physical Therapy

## 2024-02-25 NOTE — Therapy (Signed)
 OUTPATIENT PHYSICAL THERAPY LOWER EXTREMITY TREATMENT   Patient Name: Becky Cox MRN: [id number] DOB:[birth year], 73 y.o., female Today's Date: 02/26/2024  END OF SESSION:  PT End of Session - 02/26/24 1203     Visit Number 2    Number of Visits 12    Date for PT Re-Evaluation 04/02/24    Authorization Type [employer] MCR    PT Start Time 1158    PT Stop Time 1238    PT Time Calculation (min) 40 min    Activity Tolerance Patient tolerated treatment well    Behavior During Therapy WFL for tasks assessed/performed              Past Medical History:  Diagnosis Date   Fibroid    Hypertension    Thyroid disease    Nodules   Past Surgical History:  Procedure Laterality Date   ABDOMINAL HYSTERECTOMY  1996   ANKLE SURGERY  04/2011   BONE SPURS IN RIGHT ANKLE   BREAST BIOPSY Left 2019   benign   BREAST BIOPSY Right 2021   FOOT SURGERY  2007 2008   KNEE SURGERY     Arthroscopic   MOLE REMOVAL  2009   BENIGN   THYROID CYST EXCISION     2001   Patient Active Problem List   Diagnosis Date Noted   Coordination of complex care 10/03/2022   History of bone marrow biopsy 10/03/2022   Rheumatoid arthritis (HCC) 10/03/2022   Lymphoproliferative disorder (HCC) 09/23/2022   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  She switched over to [facility] from the [facility].  Pt began taking land and aquatics classes and noticed R sided groin pain.  Pt saw MD and informed  her she was not a surgical candidate.  He ordered PT and an injection.  Pt had an US guided injection on Friday and reports minimally improved sx's.  Pt has increased pain with turning to get out of a chair including trying to get out of a restaurant booth.  She has occasional pain with car transfers.  She has pain with ambulation and is limited with ambulation distance.  Pt has a little pain with stairs.  Pt unable to cross legs and has difficulty donning socks.     Pt is a member at [employer] and performs aquatic and land based exercise classes 3x/wk.  Pt has increased pain with exercises.    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 No  LIVING ENVIRONMENT: Lives with: lives alone Lives in: 1 story home Stairs:  3 steps and 2 rails   OCCUPATION: Pt is retired.  PLOF: Independent.  Pt performs stairs with a step to gait pattern.  PATIENT GOALS: improved walking, performing stairs, and pain.  Feeling good after she exercises.    OBJECTIVE:  Note: Objective measures were completed at Evaluation unless otherwise noted.  DIAGNOSTIC FINDINGS:  Hip x ray: FINDINGS: Osteoarthrosis with significant narrowing of the superolateral compartment of the joint space without evidence of fracture or other bony abnormalities.   IMPRESSION: Osteoarthrosis.                                                                                                                                TREATMENT DATE:   supine bridge 2x10  Supine clams with RTB  2x10-- Pt reports having L knee pain  S/L clams R LE 2x10  PT assessed HS, piriformis, and hip flexor flexibility Pt has tightness in bilat HS. She felt it in her R sided groin when manually stretching piriformis on bilat sides Pt felt L hip flexor stretch in R S/L'ing though felt in lateral hip when stretching R hip flexor in L S/L'ing.  Supine modified thomas stretch 2x20 sec bilat Supine HS stretch with strap x20 sec bilat Seated HS stretch x20 sec bilat  PT received a HEP handout and was educated in correct form and appropriate frequency.  PT instructed pt she should not have pain with HEP.  Manual Therapy: STM and rolling to R glute, rolling to R ITB in S/L'ing with pillow b/w knees  PATIENT EDUCATION:  Education details: dx, exercise form, relevant anatomy, HEP, rationale of interventions, and what to expect next treatment. Person educated: Patient Education method: Explanation, Demonstration, Tactile cues, Verbal cues, and Handouts Education comprehension: verbalized understanding, returned demonstration, verbal cues required, tactile cues required, and needs further education  HOME EXERCISE PROGRAM: Access Code: [email or web address] URL: [email or web address] Date: 02/20/2024 Prepared by: Aaron Edelman  Exercises - Supine Bridge  - 1 x daily - 6-7 x weekly - 2 sets - 10 reps  Updated HEP: - Clamshell  - 1 x daily - 6-7 x weekly - 2 sets - 10 reps - Seated Hamstring Stretch  - 1-2 x daily - 7 x weekly - 2-3 reps - 20 seconds hold  ASSESSMENT:  CLINICAL IMPRESSION: PT reviewed HEP and updated HEP today.  Pt performed exercises well with cuing and instruction in correct form.  Pt reports having L knee pain with supine clams with T-band.  PT assessed LE flexibility.  She has tightness in bilat HS.  She seems to have some tightness in hip flexors though felt more of the stretch on the L side.  PT performed soft tissue work to glute and ITB and pt has trigger points in L  glute.  She reports having increased pain in groin after Rx though states she is walking better.  Pt should benefit from skilled PT to address impairments and goals and to  improve overall function.     OBJECTIVE IMPAIRMENTS: Abnormal gait, decreased activity tolerance, decreased endurance, decreased mobility, difficulty walking, decreased ROM, decreased strength, hypomobility, and pain.   ACTIVITY LIMITATIONS: standing, squatting, stairs, transfers, dressing, and locomotion level  PARTICIPATION LIMITATIONS: community activity and workout activities  PERSONAL FACTORS: 3+ comorbidities: RA, L knee OA, R knee and ankle surgery  are also affecting patient's functional outcome.   REHAB POTENTIAL: Good  CLINICAL DECISION MAKING: Stable/uncomplicated  EVALUATION COMPLEXITY: Low   GOALS:  SHORT TERM GOALS: Target date: 03/12/2024  Pt will be independent and compliant with HEP for improved pain, strength, and function.  Baseline: Goal status: INITIAL  2.  Pt will demo improved quality of gait including increased step length and increasedWb'ing thru R LE for improved performance of community ambulation.  Baseline:  Goal status: INITIAL  3.  Pt will demo a significant improvement in self perceived disability with LEFS improving by at least 9 points.   Baseline:  Goal status: INITIAL Target date:  03/19/2024  4.  Pt will report at least a 30% improvement in pain and sx's overall.  Baseline:  Goal status: INITIAL    LONG TERM GOALS: Target date: 04/02/2024  Pt will demo improved strength to 5/5 in R hip abduction and knee extension for improved performance of and tolerance with functional mobility.  Baseline:  Goal status: INITIAL  2.  Pt will report she is able to ambulate extended community distance without significant pain and difficulty.   Baseline:  Goal status: INITIAL  3.  Pt will be able to perform all of her normal daily transfers without significant difficulty and pain.   Baseline:  Goal status: INITIAL  4.  Pt will have improved tolerance with exercise classes and also demonstrate understanding in modifying/restricting certain movements and exercises in classes in order to have a consistent exercise routine without exacerbating hip pain.   Baseline:  Goal status: INITIAL   PLAN:  PT FREQUENCY: 2x/week  PT DURATION: 6 weeks  PLANNED INTERVENTIONS:  97164- PT Re-evaluation, 97110-Therapeutic exercises, 97530- Therapeutic activity, [id number]- Neuromuscular re-education, 97535- Self Care, [id number]- Manual therapy, [id number]- Gait training, [phone]- Aquatic Therapy, [phone]- Electrical stimulation (unattended), [id number]- Electrical stimulation (manual), [id number]- Ultrasound, Patient/Family education, Balance training, Stair training, Taping, Dry Needling, Cryotherapy, and Moist heat  PLAN FOR NEXT SESSION: STM and rolling to glute.  LE strengthening per pt tolerance.  Cont with LE flexibility.     Audie Clear III PT, DPT 02/26/24 12:53 PM

## 2024-02-26 ENCOUNTER — Ambulatory Visit (HOSPITAL_BASED_OUTPATIENT_CLINIC_OR_DEPARTMENT_OTHER): Admitting: Physical Therapy

## 2024-02-26 ENCOUNTER — Encounter (HOSPITAL_BASED_OUTPATIENT_CLINIC_OR_DEPARTMENT_OTHER): Payer: Self-pay | Admitting: Physical Therapy

## 2024-02-26 DIAGNOSIS — M25551 Pain in right hip: Secondary | ICD-10-CM | POA: Diagnosis not present

## 2024-02-26 DIAGNOSIS — R262 Difficulty in walking, not elsewhere classified: Secondary | ICD-10-CM

## 2024-02-26 DIAGNOSIS — M6281 Muscle weakness (generalized): Secondary | ICD-10-CM

## 2024-03-14 ENCOUNTER — Ambulatory Visit (HOSPITAL_BASED_OUTPATIENT_CLINIC_OR_DEPARTMENT_OTHER): Attending: Family Medicine | Admitting: Physical Therapy

## 2024-03-14 ENCOUNTER — Encounter (HOSPITAL_BASED_OUTPATIENT_CLINIC_OR_DEPARTMENT_OTHER): Payer: Self-pay | Admitting: Physical Therapy

## 2024-03-14 DIAGNOSIS — M25551 Pain in right hip: Secondary | ICD-10-CM | POA: Diagnosis present

## 2024-03-14 DIAGNOSIS — M6281 Muscle weakness (generalized): Secondary | ICD-10-CM | POA: Diagnosis present

## 2024-03-14 DIAGNOSIS — R262 Difficulty in walking, not elsewhere classified: Secondary | ICD-10-CM | POA: Insufficient documentation

## 2024-03-14 NOTE — Therapy (Signed)
 OUTPATIENT PHYSICAL THERAPY LOWER EXTREMITY TREATMENT   Patient Name: Becky Cox MRN: [id number] DOB:[birth year], 73 y.o., female Today's Date: 03/14/2024  END OF SESSION:  PT End of Session - 03/14/24 1114     Visit Number 3    Number of Visits 12    Date for PT Re-Evaluation 04/02/24    Authorization Type [employer] MCR    PT Start Time 1111    PT Stop Time 1153    PT Time Calculation (min) 42 min    Activity Tolerance Patient tolerated treatment well    Behavior During Therapy WFL for tasks assessed/performed              Past Medical History:  Diagnosis Date   Fibroid    Hypertension    Thyroid disease    Nodules   Past Surgical History:  Procedure Laterality Date   ABDOMINAL HYSTERECTOMY  1996   ANKLE SURGERY  04/2011   BONE SPURS IN RIGHT ANKLE   BREAST BIOPSY Left 2019   benign   BREAST BIOPSY Right 2021   FOOT SURGERY  2007 2008   KNEE SURGERY     Arthroscopic   MOLE REMOVAL  2009   BENIGN   THYROID CYST EXCISION     2001   Patient Active Problem List   Diagnosis Date Noted   Coordination of complex care 10/03/2022   History of bone marrow biopsy 10/03/2022   Rheumatoid arthritis (HCC) 10/03/2022   Lymphoproliferative disorder (HCC) 09/23/2022   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  Pt states she felt like a new woman.  Pt took a [employer] class this AM.  Pt is taking land and aquatic classes at [facility] 3x/wk.  Pt does feel  her hip if they do something new in the classes and she uses ice/heat at home.  Pt states people have noticed she is walking better.  Pt states she has vertigo and doesn't lie on her back much.     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 No  LIVING ENVIRONMENT: Lives with: lives alone Lives in: 1 story home Stairs:  3 steps and 2 rails   OCCUPATION: Pt is retired.  PLOF: Independent.  Pt performs stairs with a step to gait pattern.  PATIENT GOALS: improved walking, performing stairs, and pain.  Feeling good after she exercises.    OBJECTIVE:  Note: Objective measures were completed at Evaluation unless otherwise noted.  DIAGNOSTIC FINDINGS:  Hip x ray: FINDINGS: Osteoarthrosis with significant narrowing of the superolateral compartment of the joint  space without evidence of fracture or other bony abnormalities.   IMPRESSION: Osteoarthrosis.                                                                                                                                TREATMENT DATE:     S/L clams R LE 2x10 S/L manual hip flexor stretch 3x20 sec R LE Seated HS stretch x20 sec bilat  PT received a HEP handout and was educated in correct form and appropriate frequency.  PT instructed pt she should not have pain with HEP.  Manual Therapy: STM and rolling to R glute and to R ITB in S/L'ing with pillow b/w knees. PT instructed pt in how to perform STW with tennis ball at home.  Pt used the ball on the wall to perform STM to R  glute with instruction from PT.  PT also instructed pt with using ball on ITB in sitting and S/L'ing.   PT updated HEP and gave pt a handout for soft tissue work with ball on wall.  PATIENT EDUCATION:  Education details: dx, exercise form, relevant anatomy, HEP, rationale of interventions, and POC.  PT answered pt's questions.  Person educated: Patient Education method: Explanation, Demonstration, Tactile cues, Verbal cues, and Handouts Education comprehension: verbalized understanding, returned demonstration, verbal cues required, tactile cues required, and needs further education  HOME EXERCISE PROGRAM: Access Code: [email or web address] URL: [email or web address] Date: 02/20/2024 Prepared by: Aaron Edelman  Exercises - Supine Bridge  - 1 x daily - 6-7 x weekly - 2 sets - 10 reps - Clamshell  - 1 x daily - 6-7 x weekly - 2 sets - 10 reps - Seated Hamstring Stretch  - 1-2 x daily - 7 x weekly - 2-3 reps - 20 seconds hold  Updated HEP: - Standing Glute/piriformis Mobilization with Small Ball on Wall   ASSESSMENT:  CLINICAL IMPRESSION: Pt presents to Rx stating she responded very well to the manual therapy prior Rx.  PT performed soft tissue work to [employer] and ITB.  Pt has improved soft tissue tightness in glute and continues to have tightness in ITB.  PT educated pt and demonstrated to pt how to perform soft tissue mobilization with a tennis ball at home.  Pt performed in the clinic and PT gave pt a handout.  PT avoided supine exercises today due to pt not lying on her back much due to her vertigo.  She states performs supine bridges at home which doesn't bother her.  Pt responded well to Rx stating she is feeling better and has reduced soreness after Rx.  Pt should benefit from skilled PT to address impairments and goals and to improve overall function.     OBJECTIVE IMPAIRMENTS: Abnormal gait, decreased activity tolerance, decreased endurance, decreased mobility, difficulty walking,  decreased ROM, decreased strength, hypomobility, and pain.   ACTIVITY LIMITATIONS: standing, squatting, stairs, transfers, dressing, and locomotion level  PARTICIPATION LIMITATIONS: community activity and workout activities  PERSONAL FACTORS: 3+ comorbidities: RA, L knee OA, R knee and ankle surgery  are also affecting patient's functional outcome.   REHAB POTENTIAL: Good  CLINICAL DECISION MAKING: Stable/uncomplicated  EVALUATION COMPLEXITY: Low   GOALS:  SHORT TERM GOALS: Target date: 03/12/2024  Pt will be independent and compliant with HEP for improved pain, strength, and function.  Baseline: Goal status: INITIAL  2.  Pt will demo improved quality of gait including increased step length and increasedWb'ing thru R LE for improved performance of community ambulation.  Baseline:  Goal status: INITIAL  3.  Pt will demo a significant improvement in self perceived disability with LEFS improving by at least 9 points.   Baseline:  Goal status: INITIAL Target date:  03/19/2024  4.  Pt will report at least a 30% improvement in pain and sx's overall.  Baseline:  Goal status: INITIAL    LONG TERM GOALS: Target date: 04/02/2024  Pt will demo improved strength to 5/5 in R hip abduction and knee extension for improved performance of and tolerance with functional mobility.  Baseline:  Goal status: INITIAL  2.  Pt will report she is able to ambulate extended community distance without significant pain and difficulty.   Baseline:  Goal status: INITIAL  3.  Pt will be able to perform all of her normal daily transfers without significant difficulty and pain.  Baseline:  Goal status: INITIAL  4.  Pt will have improved tolerance with exercise classes and also demonstrate understanding in modifying/restricting certain movements and exercises in classes in order to have a consistent exercise routine without exacerbating hip pain.   Baseline:  Goal status: INITIAL   PLAN:  PT  FREQUENCY: 2x/week  PT DURATION: 6 weeks  PLANNED INTERVENTIONS:  97164- PT Re-evaluation, 97110-Therapeutic exercises, 97530- Therapeutic activity, [id number]- Neuromuscular re-education, 97535- Self Care, 91478- Manual therapy, [id number]- Gait training, [phone]- Aquatic Therapy, [phone]- Electrical stimulation (unattended), [id number]- Electrical stimulation (manual), [id number]- Ultrasound, Patient/Family education, Balance training, Stair training, Taping, Dry Needling, Cryotherapy, and Moist heat  PLAN FOR NEXT SESSION: STM and rolling to glute and ITB.  LE strengthening per pt tolerance.  Cont with LE flexibility.   Trina Fujita III PT, DPT 03/14/24 5:36 PM

## 2024-03-18 ENCOUNTER — Ambulatory Visit (HOSPITAL_BASED_OUTPATIENT_CLINIC_OR_DEPARTMENT_OTHER)

## 2024-03-18 ENCOUNTER — Encounter (HOSPITAL_BASED_OUTPATIENT_CLINIC_OR_DEPARTMENT_OTHER): Payer: Self-pay

## 2024-03-18 DIAGNOSIS — M25551 Pain in right hip: Secondary | ICD-10-CM

## 2024-03-18 DIAGNOSIS — R262 Difficulty in walking, not elsewhere classified: Secondary | ICD-10-CM

## 2024-03-18 DIAGNOSIS — M6281 Muscle weakness (generalized): Secondary | ICD-10-CM

## 2024-03-18 NOTE — Therapy (Signed)
 OUTPATIENT PHYSICAL THERAPY LOWER EXTREMITY TREATMENT   Patient Name: Becky Cox MRN: [id number] DOB:12-15-1950, 73 y.o., female Today's Date: 03/18/2024  END OF SESSION:  PT End of Session - 03/18/24 1020     Visit Number 4    Number of Visits 12    Date for PT Re-Evaluation 04/02/24    Authorization Type [employer] MCR    PT Start Time 1019    PT Stop Time 1057    PT Time Calculation (min) 38 min    Activity Tolerance Patient tolerated treatment well    Behavior During Therapy WFL for tasks assessed/performed               Past Medical History:  Diagnosis Date   Fibroid    Hypertension    Thyroid  disease    Nodules   Past Surgical History:  Procedure Laterality Date   ABDOMINAL HYSTERECTOMY  1996   ANKLE SURGERY  04/2011   BONE SPURS IN RIGHT ANKLE   BREAST BIOPSY Left 2019   benign   BREAST BIOPSY Right 2021   FOOT SURGERY  2007 2008   KNEE SURGERY     Arthroscopic   MOLE REMOVAL  2009   BENIGN   THYROID  CYST EXCISION     2001   Patient Active Problem List   Diagnosis Date Noted   Coordination of complex care 10/03/2022   History of bone marrow biopsy 10/03/2022   Rheumatoid arthritis (HCC) 10/03/2022   Lymphoproliferative disorder (HCC) 09/23/2022   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 Certain moves hurt, but she tries to avoid them. Minimal to no pain at entry. She reports benefit from PT.     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 No  LIVING ENVIRONMENT: Lives with: lives alone Lives in: 1 story home Stairs:  3 steps and 2 rails   OCCUPATION: Pt is retired.  PLOF: Independent.  Pt performs stairs with a step to gait pattern.  PATIENT GOALS: improved walking, performing stairs, and pain.  Feeling good after she exercises.    OBJECTIVE:  Note: Objective measures were completed at Evaluation unless otherwise noted.  DIAGNOSTIC FINDINGS:  Hip x ray: FINDINGS: Osteoarthrosis with significant narrowing of the superolateral compartment of the joint space without evidence of fracture or other bony abnormalities.   IMPRESSION: Osteoarthrosis.  TREATMENT DATE:      R hip adductor stretching (did have some discomfort) STM to adductors/hip flexors LAD (felt good)  supine bridge 2x10  S/L clams R LE 3x10 Seated HSS 20sec x2ea Side stepping at rail x2 laps Partial squats at rail 2x10 HR/TR 2x10 Step ups onto airex 2x10ea fwd, 2x10ea lateral Marching on airex 2x10 Standing hip extension 2x10ea Standing hip adductor stretching      PATIENT EDUCATION:  Education details: dx, exercise form, relevant anatomy, HEP, rationale of interventions, and POC.  PT answered pt's questions.  Person educated: Patient Education method: Explanation, Demonstration, Tactile cues, Verbal cues, and Handouts Education comprehension: verbalized  understanding, returned demonstration, verbal cues required, tactile cues required, and needs further education  HOME EXERCISE PROGRAM: Access Code: [email or web address] URL: [email or web address] Date: 02/20/2024 Prepared by: Marnie Siren  Exercises - Supine Bridge  - 1 x daily - 6-7 x weekly - 2 sets - 10 reps - Clamshell  - 1 x daily - 6-7 x weekly - 2 sets - 10 reps - Seated Hamstring Stretch  - 1-2 x daily - 7 x weekly - 2-3 reps - 20 seconds hold  Updated HEP: - Standing Glute/piriformis Mobilization with Small Ball on Wall   ASSESSMENT:  CLINICAL IMPRESSION: Good tolerance for progressions to hip strengthening program today. Pt very tender to palpation of R hip adductors and hip flexors, though able to tolerate gentle STM here. She plans to purchase a massage roller to use at home. Mild discomfort reported with lateral stepping at rail. No complaints with partial squats or hip extensions. Will continue to monitor pain level and progress as tolerated.     OBJECTIVE IMPAIRMENTS: Abnormal gait, decreased activity tolerance, decreased endurance, decreased mobility, difficulty walking, decreased ROM, decreased strength, hypomobility, and pain.   ACTIVITY LIMITATIONS: standing, squatting, stairs, transfers, dressing, and locomotion level  PARTICIPATION LIMITATIONS: community activity and workout activities  PERSONAL FACTORS: 3+ comorbidities: RA, L knee OA, R knee and ankle surgery  are also affecting patient's functional outcome.   REHAB POTENTIAL: Good  CLINICAL DECISION MAKING: Stable/uncomplicated  EVALUATION COMPLEXITY: Low   GOALS:  SHORT TERM GOALS: Target date: 03/12/2024  Pt will be independent and compliant with HEP for improved pain, strength, and function.  Baseline: Goal status: INITIAL  2.  Pt will demo improved quality of gait including increased step length and increasedWb'ing thru R LE for improved performance of community ambulation.  Baseline:   Goal status: INITIAL  3.  Pt will demo a significant improvement in self perceived disability with LEFS improving by at least 9 points.   Baseline:  Goal status: INITIAL Target date:  03/19/2024  4.  Pt will report at least a 30% improvement in pain and sx's overall.  Baseline:  Goal status: INITIAL    LONG TERM GOALS: Target date: 04/02/2024  Pt will demo improved strength to 5/5 in R hip abduction and knee extension for improved performance of and tolerance with functional mobility.  Baseline:  Goal status: INITIAL  2.  Pt will report she is able to ambulate extended community distance without significant pain and difficulty.   Baseline:  Goal status: INITIAL  3.  Pt will be able to perform all of her normal daily transfers without significant difficulty and pain.  Baseline:  Goal status: INITIAL  4.  Pt will have improved tolerance with exercise classes and also demonstrate understanding in modifying/restricting certain movements and exercises in classes in order to have a consistent exercise routine without exacerbating  hip pain.   Baseline:  Goal status: INITIAL   PLAN:  PT FREQUENCY: 2x/week  PT DURATION: 6 weeks  PLANNED INTERVENTIONS:  97164- PT Re-evaluation, 97110-Therapeutic exercises, 97530- Therapeutic activity, [id number]- Neuromuscular re-education, 97535- Self Care, 96295- Manual therapy, [id number]- Gait training, [phone]- Aquatic Therapy, [phone]- Electrical stimulation (unattended), [id number]- Electrical stimulation (manual), [id number]- Ultrasound, Patient/Family education, Balance training, Stair training, Taping, Dry Needling, Cryotherapy, and Moist heat  PLAN FOR NEXT SESSION: STM and rolling to glute and ITB.  LE strengthening per pt tolerance.  Cont with LE flexibility.   Herb Loges, PTA  03/18/24 11:22 AM

## 2024-03-20 ENCOUNTER — Ambulatory Visit (HOSPITAL_BASED_OUTPATIENT_CLINIC_OR_DEPARTMENT_OTHER): Admitting: Physical Therapy

## 2024-03-20 ENCOUNTER — Encounter (HOSPITAL_BASED_OUTPATIENT_CLINIC_OR_DEPARTMENT_OTHER): Payer: Self-pay | Admitting: Physical Therapy

## 2024-03-20 DIAGNOSIS — M25551 Pain in right hip: Secondary | ICD-10-CM | POA: Diagnosis not present

## 2024-03-20 DIAGNOSIS — R262 Difficulty in walking, not elsewhere classified: Secondary | ICD-10-CM

## 2024-03-20 DIAGNOSIS — M6281 Muscle weakness (generalized): Secondary | ICD-10-CM

## 2024-03-20 NOTE — Therapy (Signed)
 OUTPATIENT PHYSICAL THERAPY LOWER EXTREMITY TREATMENT   Patient Name: Constancia Geeting MRN: [id number] DOB:June 19, 1951, 73 y.o., female Today's Date: 03/21/2024  END OF SESSION:  PT End of Session - 03/20/24 1229     Visit Number 5    Number of Visits 12    Date for PT Re-Evaluation 04/02/24    Authorization Type [employer] MCR    PT Start Time 1157    PT Stop Time 1239    PT Time Calculation (min) 42 min    Activity Tolerance Patient tolerated treatment well    Behavior During Therapy WFL for tasks assessed/performed                Past Medical History:  Diagnosis Date   Fibroid    Hypertension    Thyroid  disease    Nodules   Past Surgical History:  Procedure Laterality Date   ABDOMINAL HYSTERECTOMY  1996   ANKLE SURGERY  04/2011   BONE SPURS IN RIGHT ANKLE   BREAST BIOPSY Left 2019   benign   BREAST BIOPSY Right 2021   FOOT SURGERY  2007 2008   KNEE SURGERY     Arthroscopic   MOLE REMOVAL  2009   BENIGN   THYROID  CYST EXCISION     2001   Patient Active Problem List   Diagnosis Date Noted   Coordination of complex care 10/03/2022   History of bone marrow biopsy 10/03/2022   Rheumatoid arthritis (HCC) 10/03/2022   Lymphoproliferative disorder (HCC) 09/23/2022   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  Pt states she didn't go to her class yesterday.     PERTINENT HISTORY: X ray findings of osteoarthrosis in R hip RA, OA  of L knee, and HTN PSHx:  R Knee arthroscopic surgery, R ankle surgery in 2012, L foot surgery in 2007 and 2008  PAIN:  NPRS:  1-2/10 current, 8/10 worst, 1/10 best Type:  Constant nagging Location:  R sided groin and anterior and lateral thigh Pt tries not to take meloxicam Easing factors:  heat, ice, Ibuprofen  PRECAUTIONS: None    WEIGHT BEARING RESTRICTIONS: No  FALLS:  Has patient fallen in last 6 months? No  LIVING ENVIRONMENT: Lives with: lives alone Lives in: 1 story home Stairs:  3 steps and 2 rails   OCCUPATION: Pt is retired.  PLOF: Independent.  Pt performs stairs with a step to gait pattern.  PATIENT GOALS: improved walking, performing stairs, and pain.  Feeling good after she exercises.    OBJECTIVE:  Note: Objective measures were completed at Evaluation unless otherwise noted.  DIAGNOSTIC FINDINGS:  Hip x ray: FINDINGS: Osteoarthrosis with significant narrowing of the superolateral compartment of the joint space without evidence of fracture or other bony abnormalities.   IMPRESSION: Osteoarthrosis.  TREATMENT DATE:      Manual Therapy: STM and rolling to R glute and to R ITB in S/L'ing with pillow b/w knees and to R quad in supine.  Seated HSS 20sec x2ea Side stepping at rail x3 laps HR/TR 2x10 S/L manual Hip flexor stretch 2x30 sec R LE Step ups onto airex 2x10ea fwd, 2x10ea lateral Marching on airex 2x10       PATIENT EDUCATION:  Education details: dx, exercise form, relevant anatomy, HEP, rationale of interventions, and POC.  PT answered pt's questions.  Person educated: Patient Education method: Explanation, Demonstration, Tactile cues, Verbal cues, and Handouts Education comprehension: verbalized understanding, returned demonstration, verbal cues required, tactile cues required, and needs further  education  HOME EXERCISE PROGRAM: Access Code: [email or web address] URL: [email or web address] Date: 02/20/2024 Prepared by: Marnie Siren  Exercises - Supine Bridge  - 1 x daily - 6-7 x weekly - 2 sets - 10 reps - Clamshell  - 1 x daily - 6-7 x weekly - 2 sets - 10 reps - Seated Hamstring Stretch  - 1-2 x daily - 7 x weekly - 2-3 reps - 20 seconds hold  Updated HEP: - Standing Glute/piriformis Mobilization with Small Ball on Wall   ASSESSMENT:  CLINICAL IMPRESSION: Pt has improved soft tissue tightness in glute though continues to have tightness in ITB.  Pt had trigger points in R quad and PT rolled her R quad to improve soft tissue tightness and mobility.  Pt had good tolerance with exercises.  She responded well to Rx stating she feels good and feels much better than when she came in.  Pt continues to have 1/10 pain after Rx.  Pt should benefit from skilled PT to address impairments and goals and to improve overall function.     OBJECTIVE IMPAIRMENTS: Abnormal gait, decreased activity tolerance, decreased endurance, decreased mobility, difficulty walking, decreased ROM, decreased strength, hypomobility, and pain.   ACTIVITY LIMITATIONS: standing, squatting, stairs, transfers, dressing, and locomotion level  PARTICIPATION LIMITATIONS: community activity and workout activities  PERSONAL FACTORS: 3+ comorbidities: RA, L knee OA, R knee and ankle surgery  are also affecting patient's functional outcome.   REHAB POTENTIAL: Good  CLINICAL DECISION MAKING: Stable/uncomplicated  EVALUATION COMPLEXITY: Low   GOALS:  SHORT TERM GOALS: Target date: 03/12/2024  Pt will be independent and compliant with HEP for improved pain, strength, and function.  Baseline: Goal status: INITIAL  2.  Pt will demo improved quality of gait including increased step length and increasedWb'ing thru R LE for improved performance of community ambulation.  Baseline:  Goal status: INITIAL  3.  Pt  will demo a significant improvement in self perceived disability with LEFS improving by at least 9 points.   Baseline:  Goal status: INITIAL Target date:  03/19/2024  4.  Pt will report at least a 30% improvement in pain and sx's overall.  Baseline:  Goal status: INITIAL    LONG TERM GOALS: Target date: 04/02/2024  Pt will demo improved strength to 5/5 in R hip abduction and knee extension for improved performance of and tolerance with functional mobility.  Baseline:  Goal status: INITIAL  2.  Pt will report she is able to ambulate extended community distance without significant pain and difficulty.   Baseline:  Goal status: INITIAL  3.  Pt will be able to perform all of her normal daily transfers without significant difficulty and pain.  Baseline:  Goal status: INITIAL  4.  Pt will have improved tolerance with exercise classes and also  demonstrate understanding in modifying/restricting certain movements and exercises in classes in order to have a consistent exercise routine without exacerbating hip pain.   Baseline:  Goal status: INITIAL   PLAN:  PT FREQUENCY: 2x/week  PT DURATION: 6 weeks  PLANNED INTERVENTIONS:  97164- PT Re-evaluation, 97110-Therapeutic exercises, 97530- Therapeutic activity, [id number]- Neuromuscular re-education, 97535- Self Care, 16109- Manual therapy, [id number]- Gait training, [phone]- Aquatic Therapy, [phone]- Electrical stimulation (unattended), [id number]- Electrical stimulation (manual), [id number]- Ultrasound, Patient/Family education, Balance training, Stair training, Taping, Dry Needling, Cryotherapy, and Moist heat  PLAN FOR NEXT SESSION: STM and rolling to glute and ITB.  LE strengthening per pt tolerance.  Cont with LE flexibility.   Trina Fujita III PT, DPT 03/21/24 8:39 PM

## 2024-03-25 ENCOUNTER — Ambulatory Visit (HOSPITAL_BASED_OUTPATIENT_CLINIC_OR_DEPARTMENT_OTHER)

## 2024-03-25 ENCOUNTER — Encounter (HOSPITAL_BASED_OUTPATIENT_CLINIC_OR_DEPARTMENT_OTHER): Payer: Self-pay

## 2024-03-25 DIAGNOSIS — M25551 Pain in right hip: Secondary | ICD-10-CM

## 2024-03-25 DIAGNOSIS — M6281 Muscle weakness (generalized): Secondary | ICD-10-CM

## 2024-03-25 DIAGNOSIS — R262 Difficulty in walking, not elsewhere classified: Secondary | ICD-10-CM

## 2024-03-25 NOTE — Therapy (Signed)
 OUTPATIENT PHYSICAL THERAPY LOWER EXTREMITY TREATMENT   Patient Name: Becky Cox MRN: [id number] DOB:Jan 21, 1951, 73 y.o., female Today's Date: 03/25/2024  END OF SESSION:  PT End of Session - 03/25/24 1017     Visit Number 6    Number of Visits 12    Date for PT Re-Evaluation 04/02/24    Authorization Type [employer] MCR    PT Start Time 1019    PT Stop Time 1100    PT Time Calculation (min) 41 min    Activity Tolerance Patient tolerated treatment well    Behavior During Therapy WFL for tasks assessed/performed                 Past Medical History:  Diagnosis Date   Fibroid    Hypertension    Thyroid  disease    Nodules   Past Surgical History:  Procedure Laterality Date   ABDOMINAL HYSTERECTOMY  1996   ANKLE SURGERY  04/2011   BONE SPURS IN RIGHT ANKLE   BREAST BIOPSY Left 2019   benign   BREAST BIOPSY Right 2021   FOOT SURGERY  2007 2008   KNEE SURGERY     Arthroscopic   MOLE REMOVAL  2009   BENIGN   THYROID  CYST EXCISION     2001   Patient Active Problem List   Diagnosis Date Noted   Coordination of complex care 10/03/2022   History of bone marrow biopsy 10/03/2022   Rheumatoid arthritis (HCC) 10/03/2022   Lymphoproliferative disorder (HCC) 09/23/2022   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 She had difficulty raising from chair when PTA called her back to treatment area. Denies significant pain, cites tightness/weakness.      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 No  LIVING ENVIRONMENT: Lives with: lives alone Lives in: 1 story home Stairs:  3 steps and 2 rails   OCCUPATION: Pt is retired.  PLOF: Independent.  Pt performs stairs with a step to gait pattern.  PATIENT GOALS: improved walking, performing stairs, and pain.  Feeling good after she exercises.    OBJECTIVE:  Note: Objective measures were completed at Evaluation unless otherwise noted.  DIAGNOSTIC FINDINGS:  Hip x ray: FINDINGS: Osteoarthrosis with significant narrowing of the superolateral compartment of the joint space without evidence of fracture or other bony abnormalities.   IMPRESSION: Osteoarthrosis.  TREATMENT DATE:      Manual Therapy: IASTM using roller to R quad in supine.  Thomas stretch 2x30sec S/l hip flexor stretch with knee flexion  Sidelying clams 2x15 Bridge 3x10 3" hold Hooklying adductor sqz 5" 2x10 Hooklying clam GTB 2x15 Standing hip extension (for hip flexor stretch) 3x10ea Side stepping at rail x2 laps Retro walking at rail x 2laps HR/TR 2x10 Fwd step ups 4" x10R    PATIENT EDUCATION:  Education details: dx, exercise form, relevant anatomy, HEP, rationale of interventions, and POC.  PT answered pt's questions.  Person educated: Patient Education method: Explanation, Demonstration, Tactile cues, Verbal cues, and  Handouts Education comprehension: verbalized understanding, returned demonstration, verbal cues required, tactile cues required, and needs further education  HOME EXERCISE PROGRAM: Access Code: [email or web address] URL: [email or web address] Date: 02/20/2024 Prepared by: Marnie Siren  Exercises - Supine Bridge  - 1 x daily - 6-7 x weekly - 2 sets - 10 reps - Clamshell  - 1 x daily - 6-7 x weekly - 2 sets - 10 reps - Seated Hamstring Stretch  - 1-2 x daily - 7 x weekly - 2-3 reps - 20 seconds hold  Updated HEP: - Standing Glute/piriformis Mobilization with Small Ball on Wall   ASSESSMENT:  CLINICAL IMPRESSION: Patient reported mild discomfort with side-lying hip flexor stretch along with Thomas stretch today.  Afterwards however, she felt improvements in hip mobility and pain level.  Also continued with IASTM using roller to quads to decrease tension present here.  Trialed standing hip extension to improve range of motion which she tolerated well.  Instructed her to try this at home in the mornings after waking up to loosen up as well as in between periods of prolonged sitting.  Also trialed retrowalking with rail support to improve hip extension.  She did well with this and was instructed to try in the pool or at home with UE support.  Provided handout of these 2 exercises for patient reference.  OBJECTIVE IMPAIRMENTS: Abnormal gait, decreased activity tolerance, decreased endurance, decreased mobility, difficulty walking, decreased ROM, decreased strength, hypomobility, and pain.   ACTIVITY LIMITATIONS: standing, squatting, stairs, transfers, dressing, and locomotion level  PARTICIPATION LIMITATIONS: community activity and workout activities  PERSONAL FACTORS: 3+ comorbidities: RA, L knee OA, R knee and ankle surgery  are also affecting patient's functional outcome.   REHAB POTENTIAL: Good  CLINICAL DECISION MAKING: Stable/uncomplicated  EVALUATION COMPLEXITY:  Low   GOALS:  SHORT TERM GOALS: Target date: 03/12/2024  Pt will be independent and compliant with HEP for improved pain, strength, and function.  Baseline: Goal status: INITIAL  2.  Pt will demo improved quality of gait including increased step length and increasedWb'ing thru R LE for improved performance of community ambulation.  Baseline:  Goal status: INITIAL  3.  Pt will demo a significant improvement in self perceived disability with LEFS improving by at least 9 points.   Baseline:  Goal status: INITIAL Target date:  03/19/2024  4.  Pt will report at least a 30% improvement in pain and sx's overall.  Baseline:  Goal status: INITIAL    LONG TERM GOALS: Target date: 04/02/2024  Pt will demo improved strength to 5/5 in R hip abduction and knee extension for improved performance of and tolerance with functional mobility.  Baseline:  Goal status: INITIAL  2.  Pt will report she is able to ambulate extended community distance without significant pain and difficulty.   Baseline:  Goal status: INITIAL  3.  Pt will  be able to perform all of her normal daily transfers without significant difficulty and pain.  Baseline:  Goal status: INITIAL  4.  Pt will have improved tolerance with exercise classes and also demonstrate understanding in modifying/restricting certain movements and exercises in classes in order to have a consistent exercise routine without exacerbating hip pain.   Baseline:  Goal status: INITIAL   PLAN:  PT FREQUENCY: 2x/week  PT DURATION: 6 weeks  PLANNED INTERVENTIONS:  97164- PT Re-evaluation, 97110-Therapeutic exercises, 97530- Therapeutic activity, [id number]- Neuromuscular re-education, 97535- Self Care, [id number]- Manual therapy, [id number]- Gait training, [phone]- Aquatic Therapy, [phone]- Electrical stimulation (unattended), [id number]- Electrical stimulation (manual), [id number]- Ultrasound, Patient/Family education, Balance training, Stair training, Taping, Dry Needling,  Cryotherapy, and Moist heat  PLAN FOR NEXT SESSION: STM and rolling to glute and ITB.  LE strengthening per pt tolerance.  Cont with LE flexibility.   Herb Loges, PTA  03/25/24 11:31 AM

## 2024-03-27 ENCOUNTER — Encounter (HOSPITAL_BASED_OUTPATIENT_CLINIC_OR_DEPARTMENT_OTHER): Payer: Self-pay | Admitting: Physical Therapy

## 2024-03-27 ENCOUNTER — Ambulatory Visit (HOSPITAL_BASED_OUTPATIENT_CLINIC_OR_DEPARTMENT_OTHER): Admitting: Physical Therapy

## 2024-03-27 DIAGNOSIS — M6281 Muscle weakness (generalized): Secondary | ICD-10-CM

## 2024-03-27 DIAGNOSIS — M25551 Pain in right hip: Secondary | ICD-10-CM

## 2024-03-27 DIAGNOSIS — R262 Difficulty in walking, not elsewhere classified: Secondary | ICD-10-CM

## 2024-03-27 NOTE — Therapy (Signed)
 OUTPATIENT PHYSICAL THERAPY LOWER EXTREMITY TREATMENT   Patient Name: Becky Cox MRN: [id number] DOB:21-Mar-1951, 73 y.o., female Today's Date: 03/28/2024  END OF SESSION:  PT End of Session - 03/27/24 1201     Visit Number 7    Number of Visits 12    Date for PT Re-Evaluation 04/02/24    Authorization Type [employer] MCR    PT Start Time 1154    PT Stop Time 1236    PT Time Calculation (min) 42 min    Activity Tolerance Patient tolerated treatment well    Behavior During Therapy WFL for tasks assessed/performed                 Past Medical History:  Diagnosis Date   Fibroid    Hypertension    Thyroid  disease    Nodules   Past Surgical History:  Procedure Laterality Date   ABDOMINAL HYSTERECTOMY  1996   ANKLE SURGERY  04/2011   BONE SPURS IN RIGHT ANKLE   BREAST BIOPSY Left 2019   benign   BREAST BIOPSY Right 2021   FOOT SURGERY  2007 2008   KNEE SURGERY     Arthroscopic   MOLE REMOVAL  2009   BENIGN   THYROID  CYST EXCISION     2001   Patient Active Problem List   Diagnosis Date Noted   Coordination of complex care 10/03/2022   History of bone marrow biopsy 10/03/2022   Rheumatoid arthritis (HCC) 10/03/2022   Lymphoproliferative disorder (HCC) 09/23/2022   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  She felt good after prior Rx.  Pt took a [employer] class and an aqua aerobics class yesterday and had soreness afterwards.  Pt reports walking is a little better  and standing up from a chair is better.      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 No  LIVING ENVIRONMENT: Lives with: lives alone Lives in: 1 story home Stairs:  3 steps and 2 rails   OCCUPATION: Pt is retired.  PLOF: Independent.  Pt performs stairs with a step to gait pattern.  PATIENT GOALS: improved walking, performing stairs, and pain.  Feeling good after she exercises.    OBJECTIVE:  Note: Objective measures were completed at Evaluation unless otherwise noted.  DIAGNOSTIC FINDINGS:  Hip x ray: FINDINGS: Osteoarthrosis with significant narrowing of the superolateral compartment of the joint space without evidence of fracture or other bony abnormalities.   IMPRESSION: Osteoarthrosis.  TREATMENT DATE:     Reviewed pain levels, response to prior Rx, and current function.   Manual Therapy: IASTM using roller to R quad in supine and to R ITB in L S/L'ing with pillow b/w knees.  STM to ITB also in S/L'ing  Manual S/L hip flexor stretch 3x30 sec R LE  Sidelying clams 2x15 Standing hip extension (for hip flexor stretch) 2x10ea Side stepping at rail x3 laps Retro walking at rail x3 laps HR/TR 2x10 Fwd step ups 4" 2x10 R Seated HS stretch 2x20 sec bilat    PATIENT EDUCATION:  Education details: dx, exercise form, relevant anatomy, HEP, rationale of interventions, and POC.  PT answered pt's questions.  Person educated:  Patient Education method: Explanation, Demonstration, Tactile cues, Verbal cues, and Handouts Education comprehension: verbalized understanding, returned demonstration, verbal cues required, tactile cues required, and needs further education  HOME EXERCISE PROGRAM: Access Code: [email or web address] URL: [email or web address] Date: 02/20/2024 Prepared by: Marnie Siren  Exercises - Supine Bridge  - 1 x daily - 6-7 x weekly - 2 sets - 10 reps - Clamshell  - 1 x daily - 6-7 x weekly - 2 sets - 10 reps - Seated Hamstring Stretch  - 1-2 x daily - 7 x weekly - 2-3 reps - 20 seconds hold - Standing Glute/piriformis Mobilization with Small Ball on Wall   ASSESSMENT:  CLINICAL IMPRESSION: Pt presents to Rx reporting improved mobility as evidenced by subjective reports.  Her worst pain has improved from 8/10 to 6/10 pain.  PT used roller to improve soft tissue mobility in ITB and quad.  Pt has improved soft tissue tightness in ITB.  Pt is improving with tolerance of exercises as evidenced by performance and progression of exercises.  Pt responded well to Rx stating she feels good and feels much better after Rx.  She should continue to benefit from continued skilled PT to address impairments and goals and to improve overall function.   OBJECTIVE IMPAIRMENTS: Abnormal gait, decreased activity tolerance, decreased endurance, decreased mobility, difficulty walking, decreased ROM, decreased strength, hypomobility, and pain.   ACTIVITY LIMITATIONS: standing, squatting, stairs, transfers, dressing, and locomotion level  PARTICIPATION LIMITATIONS: community activity and workout activities  PERSONAL FACTORS: 3+ comorbidities: RA, L knee OA, R knee and ankle surgery  are also affecting patient's functional outcome.   REHAB POTENTIAL: Good  CLINICAL DECISION MAKING: Stable/uncomplicated  EVALUATION COMPLEXITY: Low   GOALS:  SHORT TERM GOALS: Target date: 03/12/2024  Pt will be independent and  compliant with HEP for improved pain, strength, and function.  Baseline: Goal status: INITIAL  2.  Pt will demo improved quality of gait including increased step length and increasedWb'ing thru R LE for improved performance of community ambulation.  Baseline:  Goal status: INITIAL  3.  Pt will demo a significant improvement in self perceived disability with LEFS improving by at least 9 points.   Baseline:  Goal status: INITIAL Target date:  03/19/2024  4.  Pt will report at least a 30% improvement in pain and sx's overall.  Baseline:  Goal status: INITIAL    LONG TERM GOALS: Target date: 04/02/2024  Pt will demo improved strength to 5/5 in R hip abduction and knee extension for improved performance of and tolerance with functional mobility.  Baseline:  Goal status: INITIAL  2.  Pt will report she is able to ambulate extended community distance without significant pain and difficulty.   Baseline:  Goal status: INITIAL  3.  Pt will be able to perform  all of her normal daily transfers without significant difficulty and pain.  Baseline:  Goal status: INITIAL  4.  Pt will have improved tolerance with exercise classes and also demonstrate understanding in modifying/restricting certain movements and exercises in classes in order to have a consistent exercise routine without exacerbating hip pain.   Baseline:  Goal status: INITIAL   PLAN:  PT FREQUENCY: 2x/week  PT DURATION: 6 weeks  PLANNED INTERVENTIONS:  97164- PT Re-evaluation, 97110-Therapeutic exercises, 97530- Therapeutic activity, [id number]- Neuromuscular re-education, 97535- Self Care, [id number]- Manual therapy, [id number]- Gait training, [phone]- Aquatic Therapy, [phone]- Electrical stimulation (unattended), [id number]- Electrical stimulation (manual), [id number]- Ultrasound, Patient/Family education, Balance training, Stair training, Taping, Dry Needling, Cryotherapy, and Moist heat  PLAN FOR NEXT SESSION: STM and rolling to glute and ITB.  LE  strengthening per pt tolerance.  Cont with LE flexibility.   Trina Fujita III PT, DPT 03/28/24 8:40 AM

## 2024-04-01 ENCOUNTER — Ambulatory Visit (HOSPITAL_BASED_OUTPATIENT_CLINIC_OR_DEPARTMENT_OTHER): Attending: Family Medicine

## 2024-04-01 ENCOUNTER — Encounter (HOSPITAL_BASED_OUTPATIENT_CLINIC_OR_DEPARTMENT_OTHER): Payer: Self-pay

## 2024-04-01 DIAGNOSIS — M6281 Muscle weakness (generalized): Secondary | ICD-10-CM | POA: Diagnosis present

## 2024-04-01 DIAGNOSIS — R262 Difficulty in walking, not elsewhere classified: Secondary | ICD-10-CM | POA: Insufficient documentation

## 2024-04-01 DIAGNOSIS — M25551 Pain in right hip: Secondary | ICD-10-CM | POA: Insufficient documentation

## 2024-04-01 NOTE — Therapy (Signed)
 OUTPATIENT PHYSICAL THERAPY LOWER EXTREMITY TREATMENT   Patient Name: Becky Cox MRN: [id number] DOB:06-07-1951, 73 y.o., female Today's Date: 04/01/2024  END OF SESSION:  PT End of Session - 04/01/24 1012     Visit Number 8    Number of Visits 12    Date for PT Re-Evaluation 04/02/24    Authorization Type [employer] MCR    PT Start Time 1010    PT Stop Time 1055    PT Time Calculation (min) 45 min    Activity Tolerance Patient tolerated treatment well    Behavior During Therapy WFL for tasks assessed/performed                  Past Medical History:  Diagnosis Date   Fibroid    Hypertension    Thyroid  disease    Nodules   Past Surgical History:  Procedure Laterality Date   ABDOMINAL HYSTERECTOMY  1996   ANKLE SURGERY  04/2011   BONE SPURS IN RIGHT ANKLE   BREAST BIOPSY Left 2019   benign   BREAST BIOPSY Right 2021   FOOT SURGERY  2007 2008   KNEE SURGERY     Arthroscopic   MOLE REMOVAL  2009   BENIGN   THYROID  CYST EXCISION     2001   Patient Active Problem List   Diagnosis Date Noted   Coordination of complex care 10/03/2022   History of bone marrow biopsy 10/03/2022   Rheumatoid arthritis (HCC) 10/03/2022   Lymphoproliferative disorder (HCC) 09/23/2022   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 Only mild discomfort present in anterior R hip. She went to chair yoga this morning and did well.     PERTINENT HISTORY: X ray findings of osteoarthrosis in R  hip RA, OA of L knee, and HTN PSHx:  R Knee arthroscopic surgery, R ankle surgery in 2012, L foot surgery in 2007 and 2008  PAIN:  NPRS:  1/10 current, 6/10 worst, 1/10 best Type:  Constant nagging Location:  lateral thigh Pt tries not to take meloxicam Easing factors:  heat, ice, Ibuprofen  PRECAUTIONS: None    WEIGHT BEARING RESTRICTIONS: No  FALLS:  Has patient fallen in last 6 months? No  LIVING ENVIRONMENT: Lives with: lives alone Lives in: 1 story home Stairs:  3 steps and 2 rails   OCCUPATION: Pt is retired.  PLOF: Independent.  Pt performs stairs with a step to gait pattern.  PATIENT GOALS: improved walking, performing stairs, and pain.  Feeling good after she exercises.    OBJECTIVE:  Note: Objective measures were completed at Evaluation unless otherwise noted.  DIAGNOSTIC FINDINGS:  Hip x ray: FINDINGS: Osteoarthrosis with significant narrowing of the superolateral compartment of the joint space without evidence of fracture or other bony abnormalities.   IMPRESSION: Osteoarthrosis.  TREATMENT DATE:     Reviewed pain levels, response to prior Rx, and current function.   Manual Therapy: IASTM using roller to R quad in supine and to R ITB in L S/L'ing with pillow b/w knees.  STM to glutes also in S/L'ing  Manual S/L hip flexor stretch 3x20 sec R LE  Passive adduction stretch  Sidelying clams 2x15 Piriformis stretch R only 3x30seconds Standing hip extension (for hip flexor stretch) 2x10L, 1x10 R (painful) Side stepping at rail x3 laps Retro walking at rail x3 laps HR/TR 2x15 Fwd step ups 4" 2x10 R Seated HS stretch 2x20 sec bilat Nu-step x28min L4 UE and LE end of session    PATIENT EDUCATION:  Education details: dx, exercise form, relevant anatomy, HEP, rationale of interventions, and POC.  PT answered pt's questions.   Person educated: Patient Education method: Explanation, Demonstration, Tactile cues, Verbal cues, and Handouts Education comprehension: verbalized understanding, returned demonstration, verbal cues required, tactile cues required, and needs further education  HOME EXERCISE PROGRAM: Access Code: [email or web address] URL: [email or web address] Date: 02/20/2024 Prepared by: Marnie Siren  Exercises - Supine Bridge  - 1 x daily - 6-7 x weekly - 2 sets - 10 reps - Clamshell  - 1 x daily - 6-7 x weekly - 2 sets - 10 reps - Seated Hamstring Stretch  - 1-2 x daily - 7 x weekly - 2-3 reps - 20 seconds hold - Standing Glute/piriformis Mobilization with Small Ball on Wall   ASSESSMENT:  CLINICAL IMPRESSION: Some pain today with s/l hip flexor stretch, which she states was located in lateral hip. Spent time on STM to lateral glutes where she experienced tenderness. Felt benefit from R piriformis stretch. Difficulty WB through R LE with standing hip extension, so discontinued second set. Instructed pt ot use ice over lateral hip following session today.   OBJECTIVE IMPAIRMENTS: Abnormal gait, decreased activity tolerance, decreased endurance, decreased mobility, difficulty walking, decreased ROM, decreased strength, hypomobility, and pain.   ACTIVITY LIMITATIONS: standing, squatting, stairs, transfers, dressing, and locomotion level  PARTICIPATION LIMITATIONS: community activity and workout activities  PERSONAL FACTORS: 3+ comorbidities: RA, L knee OA, R knee and ankle surgery  are also affecting patient's functional outcome.   REHAB POTENTIAL: Good  CLINICAL DECISION MAKING: Stable/uncomplicated  EVALUATION COMPLEXITY: Low   GOALS:  SHORT TERM GOALS: Target date: 03/12/2024  Pt will be independent and compliant with HEP for improved pain, strength, and function.  Baseline: Goal status: INITIAL  2.  Pt will demo improved quality of gait including increased step length and  increasedWb'ing thru R LE for improved performance of community ambulation.  Baseline:  Goal status: INITIAL  3.  Pt will demo a significant improvement in self perceived disability with LEFS improving by at least 9 points.   Baseline:  Goal status: INITIAL Target date:  03/19/2024  4.  Pt will report at least a 30% improvement in pain and sx's overall.  Baseline:  Goal status: INITIAL    LONG TERM GOALS: Target date: 04/02/2024  Pt will demo improved strength to 5/5 in R hip abduction and knee extension for improved performance of and tolerance with functional mobility.  Baseline:  Goal status: INITIAL  2.  Pt will report she is able to ambulate extended community distance without significant pain and difficulty.   Baseline:  Goal status: INITIAL  3.  Pt will be able to perform all of her normal daily transfers without significant difficulty and pain.  Baseline:  Goal status: INITIAL  4.  Pt will have improved tolerance with exercise classes and also demonstrate understanding in modifying/restricting certain movements and exercises in classes in order to have a consistent exercise routine without exacerbating hip pain.   Baseline:  Goal status: INITIAL   PLAN:  PT FREQUENCY: 2x/week  PT DURATION: 6 weeks  PLANNED INTERVENTIONS:  97164- PT Re-evaluation, 97110-Therapeutic exercises, 97530- Therapeutic activity, [id number]- Neuromuscular re-education, 97535- Self Care, 16109- Manual therapy, [id number]- Gait training, [phone]- Aquatic Therapy, [phone]- Electrical stimulation (unattended), [id number]- Electrical stimulation (manual), [id number]- Ultrasound, Patient/Family education, Balance training, Stair training, Taping, Dry Needling, Cryotherapy, and Moist heat  PLAN FOR NEXT SESSION: STM and rolling to glute and ITB.  LE strengthening per pt tolerance.  Cont with LE flexibility.  Herb Loges, PTA  04/01/24 11:42 AM

## 2024-04-03 ENCOUNTER — Ambulatory Visit (HOSPITAL_BASED_OUTPATIENT_CLINIC_OR_DEPARTMENT_OTHER): Admitting: Physical Therapy

## 2024-04-03 DIAGNOSIS — M6281 Muscle weakness (generalized): Secondary | ICD-10-CM

## 2024-04-03 DIAGNOSIS — M25551 Pain in right hip: Secondary | ICD-10-CM | POA: Diagnosis not present

## 2024-04-03 DIAGNOSIS — R262 Difficulty in walking, not elsewhere classified: Secondary | ICD-10-CM
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
## 2024-04-04 ENCOUNTER — Encounter (HOSPITAL_BASED_OUTPATIENT_CLINIC_OR_DEPARTMENT_OTHER): Payer: Self-pay | Admitting: Physical Therapy

## 2024-04-04 ENCOUNTER — Encounter (HOSPITAL_BASED_OUTPATIENT_CLINIC_OR_DEPARTMENT_OTHER): Admitting: Physical Therapy

## 2024-04-05 ENCOUNTER — Inpatient Hospital Stay: Attending: Oncology

## 2024-04-05 ENCOUNTER — Inpatient Hospital Stay: Admitting: Oncology

## 2024-04-05 ENCOUNTER — Encounter: Payer: Self-pay | Admitting: Oncology

## 2024-04-05 VITALS — BP 133/59 | HR 83 | Temp 97.5°F | Resp 18 | Wt 232.1 lb

## 2024-04-05 DIAGNOSIS — R5383 Other fatigue: Secondary | ICD-10-CM | POA: Insufficient documentation

## 2024-04-05 DIAGNOSIS — R768 Other specified abnormal immunological findings in serum: Secondary | ICD-10-CM

## 2024-04-05 DIAGNOSIS — D5912 Cold autoimmune hemolytic anemia: Secondary | ICD-10-CM | POA: Insufficient documentation

## 2024-04-05 DIAGNOSIS — Z800 Family history of malignant neoplasm of digestive organs: Secondary | ICD-10-CM | POA: Diagnosis not present

## 2024-04-05 DIAGNOSIS — M791 Myalgia, unspecified site: Secondary | ICD-10-CM | POA: Diagnosis not present

## 2024-04-05 LAB — CMP (CANCER CENTER ONLY)
ALT: 18 U/L (ref 0–44)
AST: 21 U/L (ref 15–41)
Albumin: 4.1 g/dL (ref 3.5–5.0)
Alkaline Phosphatase: 94 U/L (ref 38–126)
Anion gap: 4 — ABNORMAL LOW (ref 5–15)
BUN: 30 mg/dL — ABNORMAL HIGH (ref 8–23)
CO2: 29 mmol/L (ref 22–32)
Calcium: 9.2 mg/dL (ref 8.9–10.3)
Chloride: 108 mmol/L (ref 98–111)
Creatinine: 1.01 mg/dL — ABNORMAL HIGH (ref 0.44–1.00)
GFR, Estimated: 60 mL/min — ABNORMAL LOW (ref >60)
Glucose, Bld: 87 mg/dL (ref 70–99)
Potassium: 4 mmol/L (ref 3.5–5.1)
Sodium: 141 mmol/L (ref 135–145)
Total Bilirubin: 1.7 mg/dL — ABNORMAL HIGH (ref 0.0–1.2)
Total Protein: 6.4 g/dL — ABNORMAL LOW (ref 6.5–8.1)

## 2024-04-05 LAB — CBC WITH DIFFERENTIAL (CANCER CENTER ONLY)
Abs Immature Granulocytes: 0.02 10*3/uL (ref 0.00–0.07)
Basophils Absolute: 0.1 10*3/uL (ref 0.0–0.1)
Basophils Relative: 1 %
Eosinophils Absolute: 0.4 10*3/uL (ref 0.0–0.5)
Eosinophils Relative: 7 %
HCT: 35.8 % — ABNORMAL LOW (ref 36.0–46.0)
Hemoglobin: 12 g/dL (ref 12.0–15.0)
Immature Granulocytes: 0 %
Lymphocytes Relative: 36 %
Lymphs Abs: 2.4 10*3/uL (ref 0.7–4.0)
MCH: 30.4 pg (ref 26.0–34.0)
MCHC: 33.5 g/dL (ref 30.0–36.0)
MCV: 90.6 fL (ref 80.0–100.0)
Monocytes Absolute: 0.4 10*3/uL (ref 0.1–1.0)
Monocytes Relative: 6 %
Neutro Abs: 3.3 10*3/uL (ref 1.7–7.7)
Neutrophils Relative %: 50 %
Platelet Count: 283 10*3/uL (ref 150–400)
RBC: 3.95 MIL/uL (ref 3.87–5.11)
RDW: 14.8 % (ref 11.5–15.5)
WBC Count: 6.7 10*3/uL (ref 4.0–10.5)
nRBC: 0 % (ref 0.0–0.2)

## 2024-04-05 LAB — RETICULOCYTES
Immature Retic Fract: 7.2 % (ref 2.3–15.9)
RBC.: 3.95 MIL/uL (ref 3.87–5.11)
Retic Count, Absolute: 55.7 10*3/uL (ref 19.0–186.0)
Retic Ct Pct: 1.4 % (ref 0.4–3.1)

## 2024-04-05 LAB — LACTATE DEHYDROGENASE: LDH: 185 U/L (ref 98–192)

## 2024-04-05 NOTE — Assessment & Plan Note (Addendum)
 Stable with intermittent fatigue. Hemoglobin stable at 12 currently. LDH normal. Haptoglobin was within normal limits on last visit.  Repeat value is pending from today.  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  -Last rituximab  infusion was in December 2023.  -Consider repeat rituximab  infusions if cold agglutinin titer increases.  Decision on another rituximab  course will depend on hemoglobin trends and titer levels, not solely on cold agglutinin value.  -She was advised to contact clinic if fatigue worsens.  -Advise to maintain physical activity as tolerated.  -Recommend pool temperature closer to 90 degrees to avoid cold exposure.  RTC in 3 months for follow-up with repeat labs.
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
## 2024-04-07 LAB — HAPTOGLOBIN: Haptoglobin: 158 mg/dL (ref 42–346)

## 2024-04-08 ENCOUNTER — Ambulatory Visit (HOSPITAL_BASED_OUTPATIENT_CLINIC_OR_DEPARTMENT_OTHER)

## 2024-04-08 ENCOUNTER — Encounter (HOSPITAL_BASED_OUTPATIENT_CLINIC_OR_DEPARTMENT_OTHER): Payer: Self-pay

## 2024-04-08 DIAGNOSIS — M25551 Pain in right hip: Secondary | ICD-10-CM | POA: Diagnosis not present

## 2024-04-08 DIAGNOSIS — M6281 Muscle weakness (generalized): Secondary | ICD-10-CM

## 2024-04-08 DIAGNOSIS — R262 Difficulty in walking, not elsewhere classified: Secondary | ICD-10-CM
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
## 2024-04-09 LAB — COLD AGGLUTININ TITER: Cold Agglutinin Titer: 1:128 {titer} — ABNORMAL HIGH

## 2024-04-10 ENCOUNTER — Ambulatory Visit (HOSPITAL_BASED_OUTPATIENT_CLINIC_OR_DEPARTMENT_OTHER): Admitting: Physical Therapy

## 2024-04-10 DIAGNOSIS — M25551 Pain in right hip: Secondary | ICD-10-CM | POA: Diagnosis not present

## 2024-04-10 DIAGNOSIS — M6281 Muscle weakness (generalized): Secondary | ICD-10-CM

## 2024-04-10 DIAGNOSIS — R262 Difficulty in walking, not elsewhere classified: Secondary | ICD-10-CM

## 2024-04-10 NOTE — Therapy (Signed)
 OUTPATIENT PHYSICAL THERAPY LOWER EXTREMITY TREATMENT   Patient Name: Becky Cox MRN: [id number] DOB:January 16, 1951, 73 y.o., female Today's Date: 04/11/2024  END OF SESSION:  PT End of Session - 04/10/24 1115     Visit Number 11    Number of Visits 17    Date for PT Re-Evaluation 05/01/24    Authorization Type [employer] MCR    PT Start Time 1109    PT Stop Time 1151    PT Time Calculation (min) 42 min    Activity Tolerance Patient tolerated treatment well    Behavior During Therapy WFL for tasks assessed/performed                    Past Medical History:  Diagnosis Date   Fibroid    Hypertension    Thyroid  disease    Nodules   Past Surgical History:  Procedure Laterality Date   ABDOMINAL HYSTERECTOMY  1996   ANKLE SURGERY  04/2011   BONE SPURS IN RIGHT ANKLE   BREAST BIOPSY Left 2019   benign   BREAST BIOPSY Right 2021   FOOT SURGERY  2007 2008   KNEE SURGERY     Arthroscopic   MOLE REMOVAL  2009   BENIGN   THYROID  CYST EXCISION     2001   Patient Active Problem List   Diagnosis Date Noted   Coordination of complex care 10/03/2022   History of bone marrow biopsy 10/03/2022   Rheumatoid arthritis (HCC) 10/03/2022   Lymphoproliferative disorder (HCC) 09/23/2022   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  Pt reports improved ambulation, mobility, and standing.  Pt states her daughters tell her she is moving so much better.  Pt states she doesn't  feel the tightness she used to feel.  Pt has improved tolerance with land based and aquatic exercise classes.  She may get soreness with exercise classes though no pain.  Pt states she has difficulty getting up at times.  Pt hasn't walked a long distance.       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 No  LIVING ENVIRONMENT: Lives with: lives alone Lives in: 1 story home Stairs:  3 steps and 2 rails   OCCUPATION: Pt is retired.  PLOF: Independent.  Pt performs stairs with a step to gait pattern.  PATIENT GOALS: improved walking, performing stairs, and pain.  Feeling good after she exercises.    OBJECTIVE:  Note: Objective measures were completed at Evaluation unless otherwise noted.  DIAGNOSTIC FINDINGS:  Hip x ray: FINDINGS: Osteoarthrosis with significant narrowing of the superolateral compartment  of the joint space without evidence of fracture or other bony abnormalities.   IMPRESSION: Osteoarthrosis.                                                                                                                                TREATMENT DATE:   PATIENT SURVEYS:  LEFS:  INITIAL / CURRENT:  39/80 /  46/80                LOWER EXTREMITY ROM:   Active ROM Right eval Left eval Right 5/14 Left 5/14  Hip flexion        Hip extension        Hip abduction        Hip adduction        Hip internal rotation 17 deg seated 32 deg seated 20   Hip external rotation 24 deg seated 19 deg seated 28 19  Knee flexion        Knee extension        Ankle  dorsiflexion        Ankle plantarflexion        Ankle inversion        Ankle eversion         (Blank rows = not tested)   LOWER EXTREMITY MMT:   MMT Right eval Left eval Right 5/14 Left 5/14  Hip flexion 5/5 5/5    Hip extension        Hip abduction 4/5 4/5 4/5 4/5  Hip adduction        Hip internal rotation 5/5 w/n available ROM 5/5    Hip external rotation 5/5 4/5 w/n available ROM    Knee flexion        Knee extension 4+/5 5/5    Ankle dorsiflexion        Ankle plantarflexion        Ankle inversion        Ankle eversion         (Blank rows = not tested)       GAIT: Assistive device utilized: None Level of assistance: Complete Independence Comments:  increased step length bilat, does favor R LE some decreased step length, decreased hip extension bilat    Reviewed pain levels, response to prior Rx, and current function.    Manual Therapy: IASTM using roller to R quad in supine and to R ITB in L S/L'ing with pillow b/w knees.  STM to glutes also in S/L'ing    Nu-step x72min L4 UE and LE  Supine bridge 2x10 S/L clams with RTB 3x10 Manual S/L hip flexor stretch 3x20 sec R LE  Mini squats with UE support on rail 2x10 Sidestepping at rail x 2 laps, and 2 laps with RTB around thighs.     PATIENT EDUCATION:  Education details: dx, exercise form, relevant anatomy, HEP, rationale of interventions, and POC.  PT answered pt's questions.  Person educated: Patient Education method: Explanation,  Demonstration, Tactile cues, Verbal cues, and Handouts Education comprehension: verbalized understanding, returned demonstration, verbal cues required, tactile cues required, and needs further education  HOME EXERCISE PROGRAM: Access Code: [email or web address] URL: [email or web address] Date: 02/20/2024 Prepared by: Marnie Siren  Exercises - Supine Bridge  - 1 x daily - 6-7 x weekly - 2 sets - 10 reps - Clamshell  - 1 x daily - 6-7 x weekly - 2 sets - 10 reps - Seated  Hamstring Stretch  - 1-2 x daily - 7 x weekly - 2-3 reps - 20 seconds hold - Standing Glute/piriformis Mobilization with Small Ball on Wall   ASSESSMENT:  CLINICAL IMPRESSION: Pt is progressing well with function, pain, tolerance to activity, and mobility as evidenced by subjective reports.  She reports an 85% improvement overall in pain and sx's.  Pt states she doesn't feel the tightness she used to feel.  Pt has improved tolerance with land based and aquatic exercise classes.  She may get soreness with exercise classes though no pain.  Pt has difficulty getting up at times and hasn't walked a long distance.  Pt continues to have weakness in bilat hip abd with no improvement in MMT strength.  Pt does favor R LE though with gait though demonstrates improved quality of gait.  PT had pt perform mini squats today and she required much cuing for proper squatting form.  Pt has met STG's #1,2,4 and LTG #4.   Pt should continue to benefit from cont skilled PT services to address ongoing goals and impairments and to assist in restoring desired level of function.         OBJECTIVE IMPAIRMENTS: Abnormal gait, decreased activity tolerance, decreased endurance, decreased mobility, difficulty walking, decreased ROM, decreased strength, hypomobility, and pain.   ACTIVITY LIMITATIONS: standing, squatting, stairs, transfers, dressing, and locomotion level  PARTICIPATION LIMITATIONS: community activity and workout activities  PERSONAL FACTORS: 3+ comorbidities: RA, L knee OA, R knee and ankle surgery are also affecting patient's functional outcome.   REHAB POTENTIAL: Good  CLINICAL DECISION MAKING: Stable/uncomplicated  EVALUATION COMPLEXITY: Low   GOALS:  SHORT TERM GOALS: Target date: 03/12/2024  Pt will be independent and compliant with HEP for improved pain, strength, and function.  Baseline: Goal status: GOAL MET  5/14  2.  Pt will demo improved quality of gait including increased step length and  increasedWb'ing thru R LE for improved performance of community ambulation.  Baseline:  Goal status: GOAL MET  5/14  3.  Pt will demo a significant improvement in self perceived disability with LEFS improving by at least 9 points.   Baseline:  Goal status: PROGRESSING  5/14 Target date:  03/19/2024  4.  Pt will report at least a 30% improvement in pain and sx's overall.  Baseline:  Goal status: GOAL MET  5/14    LONG TERM GOALS: Target date: 05/01/2024  Pt will demo improved strength to 5/5 in R hip abduction and knee extension for improved performance of and tolerance with functional mobility.  Baseline:  Goal status: NOT MET   2.  Pt will report she is able to ambulate extended community distance without significant pain and difficulty.   Baseline:  Goal status: ONGOING  3.  Pt will be able to perform all of her normal daily transfers without significant difficulty and pain.  Baseline:  Goal status: ONGOING  4.  Pt will have improved tolerance with exercise classes and also demonstrate understanding in modifying/restricting certain movements and exercises in classes in order to  have a consistent exercise routine without exacerbating hip pain.   Baseline:  Goal status: GOAL MET  5/14   PLAN:  PT FREQUENCY: 2x/week  PT DURATION: 4 weeks  PLANNED INTERVENTIONS:  97164- PT Re-evaluation, 97110-Therapeutic exercises, 97530- Therapeutic activity, 97112- Neuromuscular re-education, 97535- Self Care, 16109- Manual therapy, [id number]- Gait training, [phone]- Aquatic Therapy, [phone]- Electrical stimulation (unattended), [id number]- Electrical stimulation (manual), [id number]- Ultrasound, Patient/Family education, Balance training, Stair training, Taping, Dry Needling, Cryotherapy, and Moist heat  PLAN FOR NEXT SESSION: STM and rolling to glute and ITB.  LE strengthening per pt tolerance.  Cont with LE flexibility.  Assess strength, gait, and goals next visit.   Trina Fujita III PT, DPT 04/11/24  7:35 PM

## 2024-04-11 ENCOUNTER — Encounter (HOSPITAL_BASED_OUTPATIENT_CLINIC_OR_DEPARTMENT_OTHER): Payer: Self-pay | Admitting: Physical Therapy

## 2024-04-18 ENCOUNTER — Ambulatory Visit (HOSPITAL_BASED_OUTPATIENT_CLINIC_OR_DEPARTMENT_OTHER): Admitting: Physical Therapy

## 2024-04-18 DIAGNOSIS — M25551 Pain in right hip: Secondary | ICD-10-CM | POA: Diagnosis not present

## 2024-04-18 DIAGNOSIS — R262 Difficulty in walking, not elsewhere classified: Secondary | ICD-10-CM

## 2024-04-18 DIAGNOSIS — M6281 Muscle weakness (generalized): Secondary | ICD-10-CM

## 2024-04-18 NOTE — Therapy (Signed)
 OUTPATIENT PHYSICAL THERAPY LOWER EXTREMITY TREATMENT   Patient Name: Becky Cox MRN: [id number] DOB:September 05, 1951, 73 y.o., female Today's Date: 04/19/2024  END OF SESSION:  PT End of Session - 04/18/24 1028     Visit Number 12    Number of Visits 17    Date for PT Re-Evaluation 05/01/24    Authorization Type [employer] MCR    PT Start Time 1026    PT Stop Time 1059    PT Time Calculation (min) 33 min    Activity Tolerance Patient tolerated treatment well    Behavior During Therapy WFL for tasks assessed/performed                    Past Medical History:  Diagnosis Date   Fibroid    Hypertension    Thyroid disease    Nodules   Past Surgical History:  Procedure Laterality Date   ABDOMINAL HYSTERECTOMY  1996   ANKLE SURGERY  04/2011   BONE SPURS IN RIGHT ANKLE   BREAST BIOPSY Left 2019   benign   BREAST BIOPSY Right 2021   FOOT SURGERY  2007 2008   KNEE SURGERY     Arthroscopic   MOLE REMOVAL  2009   BENIGN   THYROID CYST EXCISION     2001   Patient Active Problem List   Diagnosis Date Noted   Coordination of complex care 10/03/2022   History of bone marrow biopsy 10/03/2022   Rheumatoid arthritis (HCC) 10/03/2022   Lymphoproliferative disorder (HCC) 09/23/2022   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  She is having some mouth pain.   Pt is taking an antibiotic right now for 7 days due to the dental  work.    Pt denies any adverse effects after prior Rx.  Pt denies any pain in R hip or thigh.  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 No  LIVING ENVIRONMENT: Lives with: lives alone Lives in: 1 story home Stairs:  3 steps and 2 rails   OCCUPATION: Pt is retired.  PLOF: Independent.  Pt performs stairs with a step to gait pattern.  PATIENT GOALS: improved walking, performing stairs, and pain.  Feeling good after she exercises.    OBJECTIVE:  Note: Objective measures were completed at Evaluation unless otherwise noted.  DIAGNOSTIC FINDINGS:  Hip x ray: FINDINGS: Osteoarthrosis with significant narrowing of the superolateral compartment of the joint space without evidence of fracture or other bony abnormalities.  IMPRESSION: Osteoarthrosis.                                                                                                                                TREATMENT:     Manual Therapy:  Reviewed pain levels, response to prior Rx, and pt presentation.   Pt received STM and PT also used the roller to R quad and R ITB in longsitting with back supported.     PATIENT EDUCATION:  Education details: dx, relevant anatomy, rationale of interventions, and POC.  PT answered pt's questions.  Person educated: Patient Education method: Explanation, Demonstration, Tactile cues, Verbal cues, and Handouts Education comprehension: verbalized understanding, returned demonstration, verbal cues required, tactile cues required, and  needs further education  HOME EXERCISE PROGRAM: Access Code: [email or web address] URL: [email or web address] Date: 02/20/2024 Prepared by: Marnie Siren  Exercises - Supine Bridge  - 1 x daily - 6-7 x weekly - 2 sets - 10 reps - Clamshell  - 1 x daily - 6-7 x weekly - 2 sets - 10 reps - Seated Hamstring Stretch  - 1-2 x daily - 7 x weekly - 2-3 reps - 20 seconds hold - Standing Glute/piriformis Mobilization with Small Ball on Wall   ASSESSMENT:  CLINICAL IMPRESSION: PT didn't perform any exercises due to pt having dental work earlier this week.  PT performed soft tissue work including STM and using the roller to R quad and ITB.  Pt tolerated manual therapy well and had no c/o's after Rx.  Pt reports she is improving overall.  Pt should continue to benefit from cont skilled PT services to address ongoing goals and impairments and to assist in restoring desired level of function.         OBJECTIVE IMPAIRMENTS: Abnormal gait, decreased activity tolerance, decreased endurance, decreased mobility, difficulty walking, decreased ROM, decreased strength, hypomobility, and pain.   ACTIVITY LIMITATIONS: standing, squatting, stairs, transfers, dressing, and locomotion level  PARTICIPATION LIMITATIONS: community activity and workout activities  PERSONAL FACTORS: 3+ comorbidities: RA, L knee OA, R knee and ankle surgery are also affecting patient's functional outcome.   REHAB POTENTIAL: Good  CLINICAL DECISION MAKING: Stable/uncomplicated  EVALUATION COMPLEXITY: Low   GOALS:  SHORT TERM GOALS: Target date: 03/12/2024  Pt will be independent and compliant with HEP for improved pain, strength, and function.  Baseline: Goal status: GOAL MET  5/14  2.  Pt will demo improved quality of gait including increased step length and increasedWb'ing thru R LE for improved performance of community ambulation.  Baseline:  Goal status: GOAL MET  5/14  3.  Pt will demo a significant improvement in  self perceived disability with LEFS improving by at least 9 points.   Baseline:  Goal status: PROGRESSING  5/14 Target date:  03/19/2024  4.  Pt will report at least a 30% improvement in pain and sx's overall.  Baseline:  Goal status: GOAL MET  5/14    LONG TERM GOALS:  Target date: 05/01/2024  Pt will demo improved strength to 5/5 in R hip abduction and knee extension for improved performance of and tolerance with functional mobility.  Baseline:  Goal status: NOT MET   2.  Pt will report she is able to ambulate extended community distance without significant pain and difficulty.   Baseline:  Goal status: ONGOING  3.  Pt will be able to perform all of her normal daily transfers without significant difficulty and pain.  Baseline:  Goal status: ONGOING  4.  Pt will have improved tolerance with exercise classes and also demonstrate understanding in modifying/restricting certain movements and exercises in classes in order to have a consistent exercise routine without exacerbating hip pain.   Baseline:  Goal status: GOAL MET  5/14   PLAN:  PT FREQUENCY: 2x/week  PT DURATION: 4 weeks  PLANNED INTERVENTIONS:  97164- PT Re-evaluation, 97110-Therapeutic exercises, 97530- Therapeutic activity, 97112- Neuromuscular re-education, 97535- Self Care, 16109- Manual therapy, [id number]- Gait training, [phone]- Aquatic Therapy, [phone]- Electrical stimulation (unattended), [id number]- Electrical stimulation (manual), [id number]- Ultrasound, Patient/Family education, Balance training, Stair training, Taping, Dry Needling, Cryotherapy, and Moist heat  PLAN FOR NEXT SESSION: STM and rolling to glute and ITB.  LE strengthening per pt tolerance.  Cont with LE flexibility.     Trina Fujita III PT, DPT 04/19/24 1:34 PM

## 2024-04-19 ENCOUNTER — Encounter (HOSPITAL_BASED_OUTPATIENT_CLINIC_OR_DEPARTMENT_OTHER): Payer: Self-pay | Admitting: Physical Therapy

## 2024-04-24 ENCOUNTER — Encounter (HOSPITAL_BASED_OUTPATIENT_CLINIC_OR_DEPARTMENT_OTHER): Payer: Self-pay

## 2024-04-24 ENCOUNTER — Ambulatory Visit (HOSPITAL_BASED_OUTPATIENT_CLINIC_OR_DEPARTMENT_OTHER)

## 2024-04-24 DIAGNOSIS — M6281 Muscle weakness (generalized): Secondary | ICD-10-CM

## 2024-04-24 DIAGNOSIS — M25551 Pain in right hip: Secondary | ICD-10-CM

## 2024-04-24 DIAGNOSIS — R262 Difficulty in walking, not elsewhere classified: Secondary | ICD-10-CM

## 2024-04-24 NOTE — Therapy (Signed)
 OUTPATIENT PHYSICAL THERAPY LOWER EXTREMITY TREATMENT   Patient Name: Lucilla Petrenko MRN: [id number] DOB:1951-03-19, 73 y.o., female Today's Date: 04/24/2024  END OF SESSION:  PT End of Session - 04/24/24 1202     Visit Number 13    Number of Visits 17    Date for PT Re-Evaluation 05/01/24    Authorization Type [employer] MCR    PT Start Time 1148    PT Stop Time 1230    PT Time Calculation (min) 42 min    Activity Tolerance Patient tolerated treatment well    Behavior During Therapy WFL for tasks assessed/performed                     Past Medical History:  Diagnosis Date   Fibroid    Hypertension    Thyroid disease    Nodules   Past Surgical History:  Procedure Laterality Date   ABDOMINAL HYSTERECTOMY  1996   ANKLE SURGERY  04/2011   BONE SPURS IN RIGHT ANKLE   BREAST BIOPSY Left 2019   benign   BREAST BIOPSY Right 2021   FOOT SURGERY  2007 2008   KNEE SURGERY     Arthroscopic   MOLE REMOVAL  2009   BENIGN   THYROID CYST EXCISION     2001   Patient Active Problem List   Diagnosis Date Noted   Coordination of complex care 10/03/2022   History of bone marrow biopsy 10/03/2022   Rheumatoid arthritis (HCC) 10/03/2022   Lymphoproliferative disorder (HCC) 09/23/2022   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 Reports R hip and leg has been sore since.     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 No  LIVING ENVIRONMENT: Lives with: lives alone Lives in: 1 story home Stairs:  3 steps and 2 rails   OCCUPATION: Pt is retired.  PLOF: Independent.  Pt performs stairs with a step to gait pattern.  PATIENT GOALS: improved walking, performing stairs, and pain.  Feeling good after she exercises.    OBJECTIVE:  Note: Objective measures were completed at Evaluation unless otherwise noted.  DIAGNOSTIC FINDINGS:  Hip x ray: FINDINGS: Osteoarthrosis with significant narrowing of the superolateral compartment of the joint space without evidence of fracture or other bony abnormalities.   IMPRESSION: Osteoarthrosis.  TREATMENT:     5/28   Nu-step x24min L4 UE and LE  Supine bridge 2x10 S/L clams without resistance 2x10R Manual S/L hip flexor stretch 3x20 sec R LE  Sit to stands x7 (had pain so stopped) Retro walking in hall (1/2 hall x2) Side stepping in hall 1/2 hal x2 Walking marches 1/2 hall x2  Manual Therapy:  STM and IASTM to lateral hip, hip flexors, and quads     PATIENT EDUCATION:  Education details: dx, relevant anatomy, rationale of interventions, and POC.  PT answered pt's questions.  Person educated: Patient Education method: Explanation, Demonstration, Tactile cues, Verbal cues, and Handouts Education comprehension: verbalized understanding, returned demonstration, verbal cues required, tactile cues required, and needs further  education  HOME EXERCISE PROGRAM: Access Code: [email or web address] URL: [email or web address] Date: 02/20/2024 Prepared by: Marnie Siren  Exercises - Supine Bridge  - 1 x daily - 6-7 x weekly - 2 sets - 10 reps - Clamshell  - 1 x daily - 6-7 x weekly - 2 sets - 10 reps - Seated Hamstring Stretch  - 1-2 x daily - 7 x weekly - 2-3 reps - 20 seconds hold - Standing Glute/piriformis Mobilization with Small Ball on Wall   ASSESSMENT:  CLINICAL IMPRESSION: Pt with increased tightness today with s/l hip flexor stretching. Mild increase in tightness palpated in lateral gluteal mm. Spent time on  manual therapy to decrease restrictions in lateral hip and quads.   She experienced increased lateral hip pain with sit to stands, so discontinued these. She did well with gait related tasks. Held resistance with s/l clams today due to lateral hip soreness. Pt will return to PT tomorrow. Will assess pain levels then.    OBJECTIVE IMPAIRMENTS: Abnormal gait, decreased activity tolerance, decreased endurance, decreased mobility, difficulty walking, decreased ROM, decreased strength, hypomobility, and pain.   ACTIVITY LIMITATIONS: standing, squatting, stairs, transfers, dressing, and locomotion level  PARTICIPATION LIMITATIONS: community activity and workout activities  PERSONAL FACTORS: 3+ comorbidities: RA, L knee OA, R knee and ankle surgery are also affecting patient's functional outcome.   REHAB POTENTIAL: Good  CLINICAL DECISION MAKING: Stable/uncomplicated  EVALUATION COMPLEXITY: Low   GOALS:  SHORT TERM GOALS: Target date: 03/12/2024  Pt will be independent and compliant with HEP for improved pain, strength, and function.  Baseline: Goal status: GOAL MET  5/14  2.  Pt will demo improved quality of gait including increased step length and increasedWb'ing thru R LE for improved performance of community ambulation.  Baseline:  Goal status: GOAL MET  5/14  3.  Pt will demo a  significant improvement in self perceived disability with LEFS improving by at least 9 points.   Baseline:  Goal status: PROGRESSING  5/14 Target date:  03/19/2024  4.  Pt will report at least a 30% improvement in pain and sx's overall.  Baseline:  Goal status: GOAL MET  5/14    LONG TERM GOALS: Target date: 05/01/2024  Pt will demo improved strength to 5/5 in R hip abduction and knee extension for improved performance of and tolerance with functional mobility.  Baseline:  Goal status: NOT MET   2.  Pt will report she is able to ambulate extended community distance without significant pain and difficulty.   Baseline:  Goal status: ONGOING  3.  Pt will be able to perform all of her normal daily transfers without significant difficulty and pain.  Baseline:  Goal status: ONGOING  4.  Pt will have improved tolerance with exercise classes and  also demonstrate understanding in modifying/restricting certain movements and exercises in classes in order to have a consistent exercise routine without exacerbating hip pain.   Baseline:  Goal status: GOAL MET  5/14   PLAN:  PT FREQUENCY: 2x/week  PT DURATION: 4 weeks  PLANNED INTERVENTIONS:  97164- PT Re-evaluation, 97110-Therapeutic exercises, 97530- Therapeutic activity, 97112- Neuromuscular re-education, 97535- Self Care, 96045- Manual therapy, [id number]- Gait training, [phone]- Aquatic Therapy, [phone]- Electrical stimulation (unattended), [id number]- Electrical stimulation (manual), [id number]- Ultrasound, Patient/Family education, Balance training, Stair training, Taping, Dry Needling, Cryotherapy, and Moist heat  PLAN FOR NEXT SESSION: STM and rolling to glute and ITB.  LE strengthening per pt tolerance.  Cont with LE flexibility.     Herb Loges, PTA  04/24/24 1:00 PM

## 2024-04-25 ENCOUNTER — Ambulatory Visit (HOSPITAL_BASED_OUTPATIENT_CLINIC_OR_DEPARTMENT_OTHER)

## 2024-04-25 ENCOUNTER — Encounter (HOSPITAL_BASED_OUTPATIENT_CLINIC_OR_DEPARTMENT_OTHER): Payer: Self-pay

## 2024-04-25 DIAGNOSIS — R262 Difficulty in walking, not elsewhere classified: Secondary | ICD-10-CM

## 2024-04-25 DIAGNOSIS — M25551 Pain in right hip: Secondary | ICD-10-CM | POA: Diagnosis not present

## 2024-04-25 DIAGNOSIS — M6281 Muscle weakness (generalized): Secondary | ICD-10-CM

## 2024-04-25 NOTE — Therapy (Signed)
 OUTPATIENT PHYSICAL THERAPY LOWER EXTREMITY TREATMENT   Patient Name: Becky Cox MRN: [id number] DOB:[birth year], 73 y.o., female Today's Date: 04/25/2024  END OF SESSION:  PT End of Session - 04/25/24 1256     Visit Number 14    Number of Visits 17    Date for PT Re-Evaluation 05/01/24    Authorization Type [employer] MCR    PT Start Time 0848    PT Stop Time 0930    PT Time Calculation (min) 42 min    Activity Tolerance Patient tolerated treatment well    Behavior During Therapy WFL for tasks assessed/performed                      Past Medical History:  Diagnosis Date   Fibroid    Hypertension    Thyroid disease    Nodules   Past Surgical History:  Procedure Laterality Date   ABDOMINAL HYSTERECTOMY  1996   ANKLE SURGERY  04/2011   BONE SPURS IN RIGHT ANKLE   BREAST BIOPSY Left 2019   benign   BREAST BIOPSY Right 2021   FOOT SURGERY  2007 2008   KNEE SURGERY     Arthroscopic   MOLE REMOVAL  2009   BENIGN   THYROID CYST EXCISION     2001   Patient Active Problem List   Diagnosis Date Noted   Coordination of complex care 10/03/2022   History of bone marrow biopsy 10/03/2022   Rheumatoid arthritis (HCC) 10/03/2022   Lymphoproliferative disorder (HCC) 09/23/2022   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 Ice has helped too. She plans to attend [employer] class after today's session.     PERTINENT HISTORY: X ray findings of  osteoarthrosis in R hip RA, OA of L knee, and HTN PSHx:  R Knee arthroscopic surgery, R ankle surgery in 2012, L foot surgery in 2007 and 2008  PAIN:  NPRS:  0/10 current, 6/10 worst, 1/10 best Type:  nagging Location:  hip and thigh Pt tries not to take meloxicam Easing factors:  heat, ice, Ibuprofen  PRECAUTIONS: None    WEIGHT BEARING RESTRICTIONS: No  FALLS:  Has patient fallen in last 6 months? No  LIVING ENVIRONMENT: Lives with: lives alone Lives in: 1 story home Stairs:  3 steps and 2 rails   OCCUPATION: Pt is retired.  PLOF: Independent.  Pt performs stairs with a step to gait pattern.  PATIENT GOALS: improved walking, performing stairs, and pain.  Feeling good after she exercises.    OBJECTIVE:  Note: Objective measures were completed at Evaluation unless otherwise noted.  DIAGNOSTIC FINDINGS:  Hip x ray: FINDINGS: Osteoarthrosis with significant narrowing of the superolateral compartment of the joint space without evidence of fracture or other bony abnormalities.   IMPRESSION: Osteoarthrosis.  TREATMENT:    5/29   Nu-step x85min L5 UE and LE  Supine bridge 2x10 S/L clams without resistance 2x10R Manual S/L hip flexor stretch 3x20 sec R LE  Sit to stands x7 (had pain so stopped) Retro walking along rail x3 laps Side stepping along rail x2 laps Walking marches along rail x2laps LAQ 4# 5" 2x10 Seated HSC RTB x20 Step ups 4" (switched to 2")  R LE x10 with cues for glute activation Lateral step ups 2" x10 with cues for glute activation  Manual Therapy:  IASTM- roller to R hip in sidleying     5/28   Nu-step x75min L4 UE and LE  Supine bridge 2x10 S/L clams without resistance 2x10R Manual S/L hip flexor stretch 3x20 sec R LE  Sit to stands x7 (had pain so stopped) Retro walking in hall (1/2 hall x2) Side  stepping in hall 1/2 hal x2 Walking marches 1/2 hall x2  Manual Therapy:  STM and IASTM to lateral hip, hip flexors, and quads     PATIENT EDUCATION:  Education details: dx, relevant anatomy, rationale of interventions, and POC.  PT answered pt's questions.  Person educated: Patient Education method: Explanation, Demonstration, Tactile cues, Verbal cues, and Handouts Education comprehension: verbalized understanding, returned demonstration, verbal cues required, tactile cues required, and needs further education  HOME EXERCISE PROGRAM: Access Code: [email or web address] URL: [email or web address] Date: 02/20/2024 Prepared by: Marnie Siren  Exercises - Supine Bridge  - 1 x daily - 6-7 x weekly - 2 sets - 10 reps - Clamshell  - 1 x daily - 6-7 x weekly - 2 sets - 10 reps - Seated Hamstring Stretch  - 1-2 x daily - 7 x weekly - 2-3 reps - 20 seconds hold - Standing Glute/piriformis Mobilization with Small Ball on Wall   ASSESSMENT:  CLINICAL IMPRESSION: Pt able to advance today due to decreased pain level. She did have discomfort with step up at 4" step, so trialled 2" step with emphasis on glute activation. Pt continues to demonstrate tenderness in anterior hip and benefits from manual stretch in s/l. Instructed pt to continue with ice use to manage soreness/pain.    OBJECTIVE IMPAIRMENTS: Abnormal gait, decreased activity tolerance, decreased endurance, decreased mobility, difficulty walking, decreased ROM, decreased strength, hypomobility, and pain.   ACTIVITY LIMITATIONS: standing, squatting, stairs, transfers, dressing, and locomotion level  PARTICIPATION LIMITATIONS: community activity and workout activities  PERSONAL FACTORS: 3+ comorbidities: RA, L knee OA, R knee and ankle surgery are also affecting patient's functional outcome.   REHAB POTENTIAL: Good  CLINICAL DECISION MAKING: Stable/uncomplicated  EVALUATION COMPLEXITY: Low   GOALS:  SHORT TERM GOALS:  Target date: 03/12/2024  Pt will be independent and compliant with HEP for improved pain, strength, and function.  Baseline: Goal status: GOAL MET  5/14  2.  Pt will demo improved quality of gait including increased step length and increasedWb'ing thru R LE for improved performance of community ambulation.  Baseline:  Goal status: GOAL MET  5/14  3.  Pt will demo a significant improvement in self perceived disability with LEFS improving by at least 9 points.   Baseline:  Goal status: PROGRESSING  5/14 Target date:  03/19/2024  4.  Pt will report at least a 30% improvement in pain and sx's overall.  Baseline:  Goal status: GOAL MET  5/14    LONG TERM GOALS: Target date: 05/01/2024  Pt will demo improved strength to 5/5 in R hip abduction and knee extension for improved performance of  and tolerance with functional mobility.  Baseline:  Goal status: NOT MET   2.  Pt will report she is able to ambulate extended community distance without significant pain and difficulty.   Baseline:  Goal status: ONGOING  3.  Pt will be able to perform all of her normal daily transfers without significant difficulty and pain.  Baseline:  Goal status: ONGOING  4.  Pt will have improved tolerance with exercise classes and also demonstrate understanding in modifying/restricting certain movements and exercises in classes in order to have a consistent exercise routine without exacerbating hip pain.   Baseline:  Goal status: GOAL MET  5/14   PLAN:  PT FREQUENCY: 2x/week  PT DURATION: 4 weeks  PLANNED INTERVENTIONS:  97164- PT Re-evaluation, 97110-Therapeutic exercises, 97530- Therapeutic activity, 97112- Neuromuscular re-education, 97535- Self Care, 16109- Manual therapy, [id number]- Gait training, [phone]- Aquatic Therapy, [phone]- Electrical stimulation (unattended), [id number]- Electrical stimulation (manual), [id number]- Ultrasound, Patient/Family education, Balance training, Stair training, Taping, Dry Needling,  Cryotherapy, and Moist heat  PLAN FOR NEXT SESSION: STM and rolling to glute and ITB.  LE strengthening per pt tolerance.  Cont with LE flexibility.     Herb Loges, PTA  04/25/24 1:08 PM

## 2024-05-01 ENCOUNTER — Encounter (HOSPITAL_BASED_OUTPATIENT_CLINIC_OR_DEPARTMENT_OTHER): Payer: Self-pay | Admitting: Physical Therapy

## 2024-05-01 ENCOUNTER — Ambulatory Visit (HOSPITAL_BASED_OUTPATIENT_CLINIC_OR_DEPARTMENT_OTHER): Attending: Family Medicine | Admitting: Physical Therapy

## 2024-05-01 DIAGNOSIS — M6281 Muscle weakness (generalized): Secondary | ICD-10-CM | POA: Insufficient documentation

## 2024-05-01 DIAGNOSIS — R262 Difficulty in walking, not elsewhere classified: Secondary | ICD-10-CM | POA: Diagnosis present

## 2024-05-01 DIAGNOSIS — M25551 Pain in right hip: Secondary | ICD-10-CM | POA: Insufficient documentation

## 2024-05-01 NOTE — Therapy (Signed)
 OUTPATIENT PHYSICAL THERAPY LOWER EXTREMITY TREATMENT   Patient Name: Danasia Baker MRN: [id number] DOB:25-Feb-1951, 73 y.o., female Today's Date: 05/02/2024  END OF SESSION:  PT End of Session - 05/01/24 1405     Visit Number 15    Number of Visits 17    Date for PT Re-Evaluation 05/01/24    Authorization Type [employer] MCR    PT Start Time 1316    PT Stop Time 1400    PT Time Calculation (min) 44 min    Activity Tolerance Patient tolerated treatment well    Behavior During Therapy WFL for tasks assessed/performed                       Past Medical History:  Diagnosis Date   Fibroid    Hypertension    Thyroid disease    Nodules   Past Surgical History:  Procedure Laterality Date   ABDOMINAL HYSTERECTOMY  1996   ANKLE SURGERY  04/2011   BONE SPURS IN RIGHT ANKLE   BREAST BIOPSY Left 2019   benign   BREAST BIOPSY Right 2021   FOOT SURGERY  2007 2008   KNEE SURGERY     Arthroscopic   MOLE REMOVAL  2009   BENIGN   THYROID CYST EXCISION     2001   Patient Active Problem List   Diagnosis Date Noted   Coordination of complex care 10/03/2022   History of bone marrow biopsy 10/03/2022   Rheumatoid arthritis (HCC) 10/03/2022   Lymphoproliferative disorder (HCC) 09/23/2022   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  Pt denies any adverse effects after prior Rx.  Pt states she has been performing her HEP.  Pt states she is able to perform her daily  transfers without significant pain and difficulty.  Pt reports she has some difficulty with ambulating extended distance though not significant pain or difficulty.    Pt states she plans to use the [employer] gym more.    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 No  LIVING ENVIRONMENT: Lives with: lives alone Lives in: 1 story home Stairs:  3 steps and 2 rails   OCCUPATION: Pt is retired.  PLOF: Independent.  Pt performs stairs with a step to gait pattern.  PATIENT GOALS: improved walking, performing stairs, and pain.  Feeling good after she exercises.    OBJECTIVE:  Note: Objective measures were completed at Evaluation unless otherwise noted.  DIAGNOSTIC FINDINGS:  Hip x ray: FINDINGS: Osteoarthrosis with significant narrowing of the superolateral compartment of the joint  space without evidence of fracture or other bony abnormalities.   IMPRESSION: Osteoarthrosis.                                                                                                                                TREATMENT:    6/4  Nustep L4-5 x 5 mins UE/Les  Manual Therapy: STM and rolling to R glute and R ITB in S/L'ing with pillow b/w knees  LAQ 4# with 5 sec hold x 10, 5# x10 Sidestepping x 1 lap at rail, x 2 laps with RTB around thighs Walking marches at rail x 2 laps Retro walking along rail x3 laps Step ups on 2 inch step 2x10 Cybex leg press 40# 2x10  5/29   Nu-step x74min L5 UE and LE  Supine bridge 2x10 S/L clams without resistance  2x10R Manual S/L hip flexor stretch 3x20 sec R LE  Sit to stands x7 (had pain so stopped) Retro walking along rail x3 laps Side stepping along rail x2 laps Walking marches along rail x2laps LAQ 4# 5" 2x10 Seated HSC RTB x20 Step ups 4" (switched to 2")  R LE x10 with cues for glute activation Lateral step ups 2" x10 with cues for glute activation  Manual Therapy:  IASTM- roller to R hip in sidleying   5/28  Nu-step x37min L4 UE and LE  Supine bridge 2x10 S/L clams without resistance 2x10R Manual S/L hip flexor stretch 3x20 sec R LE  Sit to stands x7 (had pain so stopped) Retro walking in hall (1/2 hall x2) Side stepping in hall 1/2 hal x2 Walking marches 1/2 hall x2  Manual Therapy:  STM and IASTM to lateral hip, hip flexors, and quads     PATIENT EDUCATION:  Education details: dx, relevant anatomy, rationale of interventions, exercise form, and POC.  PT answered pt's questions.  Person educated: Patient Education method: Explanation, Demonstration, Tactile cues, Verbal cues, and Handouts Education comprehension: verbalized understanding, returned demonstration, verbal cues required, tactile cues required, and needs further education  HOME EXERCISE PROGRAM: Access Code: [email or web address] URL: [email or web address] Date: 02/20/2024 Prepared by: Marnie Siren  Exercises - Supine Bridge  - 1 x daily - 6-7 x weekly - 2 sets - 10 reps - Clamshell  - 1 x daily - 6-7 x weekly - 2 sets - 10 reps - Seated Hamstring Stretch  - 1-2 x daily - 7 x weekly - 2-3 reps - 20 seconds hold - Standing Glute/piriformis Mobilization with Small Ball on Wall   ASSESSMENT:  CLINICAL IMPRESSION: Pt is improving with pain, sx's, and function as evidenced by subjective reports.  She is able to perform her daily transfers without significant pain and difficulty. Pt reports being able to ambulate extended distance without significant pain or difficulty.  Pt met LTG's #2,3.  Pt expressed  interest in performing more gym activies.  PT had pt perform leg press and educated pt in set-up and form.  Pt state she felt much better after manual therapy.   OBJECTIVE IMPAIRMENTS: Abnormal gait, decreased activity tolerance, decreased endurance, decreased mobility, difficulty walking, decreased ROM, decreased strength, hypomobility, and pain.   ACTIVITY LIMITATIONS: standing, squatting, stairs, transfers, dressing, and locomotion level  PARTICIPATION LIMITATIONS: community activity and workout activities  PERSONAL FACTORS: 3+ comorbidities: RA, L knee OA, R knee and ankle surgery are also affecting patient's functional outcome.   REHAB POTENTIAL: Good  CLINICAL DECISION MAKING: Stable/uncomplicated  EVALUATION COMPLEXITY: Low   GOALS:  SHORT TERM GOALS: Target date: 03/12/2024  Pt will be independent and compliant with HEP for improved pain, strength, and function.  Baseline: Goal status: GOAL MET  5/14  2.  Pt will demo improved quality of gait including increased step length and increasedWb'ing thru R LE for improved performance of community ambulation.  Baseline:  Goal status: GOAL MET  5/14  3.  Pt will demo a significant improvement in self perceived disability with LEFS improving by at least 9 points.   Baseline:  Goal status: PROGRESSING  5/14 Target date:  03/19/2024  4.  Pt will report at least a 30% improvement in pain and sx's overall.  Baseline:  Goal status: GOAL MET  5/14    LONG TERM GOALS: Target date: 05/01/2024  Pt will demo improved strength to 5/5 in R hip abduction and knee extension for improved performance of and tolerance with functional mobility.  Baseline:  Goal status: NOT MET   2.  Pt will report she is able to ambulate extended community distance without significant pain and difficulty.   Baseline:  Goal status: GOAL MET  6/4  3.  Pt will be able to perform all of her normal daily transfers without significant difficulty and  pain.  Baseline:  Goal status:  GOAL MET  6/4  4.  Pt will have improved tolerance with exercise classes and also demonstrate understanding in modifying/restricting certain movements and exercises in classes in order to have a consistent exercise routine without exacerbating hip pain.   Baseline:  Goal status: GOAL MET  5/14   PLAN:  PT FREQUENCY: 2x/week  PT DURATION: 4 weeks  PLANNED INTERVENTIONS:  97164- PT Re-evaluation, 97110-Therapeutic exercises, 97530- Therapeutic activity, 97112- Neuromuscular re-education, 97535- Self Care, 82956- Manual therapy, [id number]- Gait training, [phone]- Aquatic Therapy, [phone]- Electrical stimulation (unattended), [id number]- Electrical stimulation (manual), [id number]- Ultrasound, Patient/Family education, Balance training, Stair training, Taping, Dry Needling, Cryotherapy, and Moist heat  PLAN FOR NEXT SESSION: STM and rolling to glute and ITB.  LE strengthening per pt tolerance.  Cont with LE flexibility.  Recert next visit and possible discharge in the next 2 visits.     Trina Fujita III PT, DPT 05/02/24 10:23 PM

## 2024-05-06 NOTE — Therapy (Signed)
 OUTPATIENT PHYSICAL THERAPY LOWER EXTREMITY TREATMENT PROGRESS NOTE   Patient Name: Tanise Russman MRN: [id number] DOB:1951-11-27, 73 y.o., female Today's Date: 05/06/2024  END OF SESSION:              Past Medical History:  Diagnosis Date   Fibroid    Hypertension    Thyroid disease    Nodules   Past Surgical History:  Procedure Laterality Date   ABDOMINAL HYSTERECTOMY  1996   ANKLE SURGERY  04/2011   BONE SPURS IN RIGHT ANKLE   BREAST BIOPSY Left 2019   benign   BREAST BIOPSY Right 2021   FOOT SURGERY  2007 2008   KNEE SURGERY     Arthroscopic   MOLE REMOVAL  2009   BENIGN   THYROID CYST EXCISION     2001   Patient Active Problem List   Diagnosis Date Noted   Coordination of complex care 10/03/2022   History of bone marrow biopsy 10/03/2022   Rheumatoid arthritis (HCC) 10/03/2022   Lymphoproliferative disorder (HCC) 09/23/2022   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  Rationale for Evaluation and Treatment: Rehabilitation  ONSET DATE: January 2025  SUBJECTIVE:   SUBJECTIVE STATEMENT: Pt states she is sore from her exercise class yesterday.  Pt denies any adverse effects after prior Rx.  Pt states she has been performing her HEP.  Pt states she is able to perform her daily transfers without significant pain and difficulty.  Pt reports she has some difficulty with ambulating extended distance though not significant pain or difficulty.    Pt states she plans to use the [employer] gym more.    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 No  LIVING ENVIRONMENT: Lives with: lives alone Lives in: 1 story home Stairs:  3 steps and 2 rails   OCCUPATION: Pt is retired.  PLOF: Independent.  Pt performs stairs with a step to gait pattern.  PATIENT GOALS: improved walking, performing stairs, and pain.  Feeling good after she exercises.    OBJECTIVE:  Note: Objective measures were completed at Evaluation unless otherwise noted.  DIAGNOSTIC FINDINGS:  Hip x ray: FINDINGS: Osteoarthrosis with significant narrowing of the superolateral compartment of the joint space without evidence of fracture or other bony abnormalities.   IMPRESSION: Osteoarthrosis.  TREATMENT:   6/10  PATIENT SURVEYS:  LEFS:  INITIAL / CURRENT:  39/80 /  46/80                 LOWER EXTREMITY MMT:   MMT Right eval Left eval Right 5/14 Left 5/14  Hip flexion 5/5 5/5      Hip extension          Hip abduction 4/5 4/5 4/5 4/5  Hip adduction          Hip internal rotation 5/5 w/n available ROM 5/5      Hip external rotation 5/5 4/5 w/n available ROM      Knee flexion          Knee extension 4+/5 5/5      Ankle dorsiflexion          Ankle plantarflexion          Ankle inversion          Ankle eversion           (Blank rows = not tested)       GAIT: Assistive device utilized: None Level of assistance: Complete Independence Comments:  increased step length bilat, does favor R LE some decreased step length, decreased hip extension bilat   6/4  Nustep L4-5 x 5 mins UE/Les  Manual Therapy: STM and rolling to R glute and R ITB  in S/L'ing with pillow b/w knees  LAQ 4# with 5 sec hold x 10, 5# x10 Sidestepping x 1 lap at rail, x 2 laps with RTB around thighs Walking marches at rail x 2 laps Retro walking along rail x3 laps Step ups on 2 inch step 2x10 Cybex leg press 40# 2x10  5/29   Nu-step x83min L5 UE and LE  Supine bridge 2x10 S/L clams without resistance 2x10R Manual S/L hip flexor stretch 3x20 sec R LE  Sit to stands x7 (had pain so stopped) Retro walking along rail x3 laps Side stepping along rail x2 laps Walking marches along rail x2laps LAQ 4# 5" 2x10 Seated HSC RTB x20 Step ups 4" (switched to 2")  R LE x10 with cues for glute activation Lateral step ups 2" x10 with cues for glute activation  Manual Therapy:  IASTM- roller to R hip in sidleying   5/28  Nu-step x36min L4 UE and LE  Supine bridge 2x10 S/L clams without resistance 2x10R Manual S/L hip flexor stretch 3x20 sec R LE  Sit to stands x7 (had pain so stopped) Retro walking in hall (1/2 hall x2) Side stepping in hall 1/2 hal x2 Walking marches 1/2 hall x2  Manual Therapy:  STM and IASTM to lateral hip, hip flexors, and quads     PATIENT EDUCATION:  Education details: dx, relevant anatomy, rationale of interventions, exercise form, and POC.  PT answered pt's questions.  Person educated: Patient Education method: Explanation, Demonstration, Tactile cues, Verbal cues, and Handouts Education comprehension: verbalized understanding, returned demonstration, verbal cues required, tactile cues required, and needs further education  HOME EXERCISE PROGRAM: Access Code: [email or web address] URL: [email or web address] Date: 02/20/2024 Prepared by: Marnie Siren  Exercises - Supine Bridge  - 1 x daily - 6-7 x weekly - 2 sets - 10 reps - Clamshell  - 1 x daily - 6-7 x weekly - 2 sets - 10 reps - Seated Hamstring Stretch  - 1-2 x daily - 7 x weekly - 2-3 reps - 20 seconds hold - Standing Glute/piriformis Mobilization with Small  Ball on Wall  ASSESSMENT:  CLINICAL IMPRESSION: Pt is improving with pain, sx's, and function as evidenced by subjective reports.  She is able to perform her daily transfers without significant pain and difficulty. Pt reports being able to ambulate extended distance without significant pain or difficulty.  Pt met LTG's #2,3.  Pt expressed interest in performing more gym activies.  PT had pt perform leg press and educated pt in set-up and form.       Pt state she felt much better after manual therapy.   OBJECTIVE IMPAIRMENTS: Abnormal gait, decreased activity tolerance, decreased endurance, decreased mobility, difficulty walking, decreased ROM, decreased strength, hypomobility, and pain.   ACTIVITY LIMITATIONS: standing, squatting, stairs, transfers, dressing, and locomotion level  PARTICIPATION LIMITATIONS: community activity and workout activities  PERSONAL FACTORS: 3+ comorbidities: RA, L knee OA, R knee and ankle surgery are also affecting patient's functional outcome.   REHAB POTENTIAL: Good  CLINICAL DECISION MAKING: Stable/uncomplicated  EVALUATION COMPLEXITY: Low   GOALS:  SHORT TERM GOALS: Target date: 03/12/2024  Pt will be independent and compliant with HEP for improved pain, strength, and function.  Baseline: Goal status: GOAL MET  5/14  2.  Pt will demo improved quality of gait including increased step length and increasedWb'ing thru R LE for improved performance of community ambulation.  Baseline:  Goal status: GOAL MET  5/14  3.  Pt will demo a significant improvement in self perceived disability with LEFS improving by at least 9 points.   Baseline:  Goal status: PROGRESSING  5/14 Target date:  03/19/2024  4.  Pt will report at least a 30% improvement in pain and sx's overall.  Baseline:  Goal status: GOAL MET  5/14    LONG TERM GOALS: Target date: 05/01/2024  Pt will demo improved strength to 5/5 in R hip abduction and knee extension for improved  performance of and tolerance with functional mobility.  Baseline:  Goal status: NOT MET   2.  Pt will report she is able to ambulate extended community distance without significant pain and difficulty.   Baseline:  Goal status: GOAL MET  6/4  3.  Pt will be able to perform all of her normal daily transfers without significant difficulty and pain.  Baseline:  Goal status:  GOAL MET  6/4  4.  Pt will have improved tolerance with exercise classes and also demonstrate understanding in modifying/restricting certain movements and exercises in classes in order to have a consistent exercise routine without exacerbating hip pain.   Baseline:  Goal status: GOAL MET  5/14   PLAN:  PT FREQUENCY: 2x/week  PT DURATION: 4 weeks  PLANNED INTERVENTIONS:  97164- PT Re-evaluation, 97110-Therapeutic exercises, 97530- Therapeutic activity, 97112- Neuromuscular re-education, 97535- Self Care, [id number]- Manual therapy, [id number]- Gait training, [phone]- Aquatic Therapy, [phone]- Electrical stimulation (unattended), [id number]- Electrical stimulation (manual), [id number]- Ultrasound, Patient/Family education, Balance training, Stair training, Taping, Dry Needling, Cryotherapy, and Moist heat  PLAN FOR NEXT SESSION: STM and rolling to glute and ITB.  LE strengthening per pt tolerance.  Cont with LE flexibility.  Recert next visit and possible discharge in the next 2 visits.     Trina Fujita III PT, DPT 05/06/24 10:25 PM

## 2024-05-07 ENCOUNTER — Encounter (HOSPITAL_BASED_OUTPATIENT_CLINIC_OR_DEPARTMENT_OTHER): Payer: Self-pay | Admitting: Physical Therapy

## 2024-05-07 ENCOUNTER — Ambulatory Visit (HOSPITAL_BASED_OUTPATIENT_CLINIC_OR_DEPARTMENT_OTHER): Admitting: Physical Therapy

## 2024-05-07 DIAGNOSIS — M25551 Pain in right hip: Secondary | ICD-10-CM

## 2024-05-07 DIAGNOSIS — R262 Difficulty in walking, not elsewhere classified: Secondary | ICD-10-CM

## 2024-05-07 DIAGNOSIS — M6281 Muscle weakness (generalized): Secondary | ICD-10-CM

## 2024-05-14 ENCOUNTER — Ambulatory Visit (HOSPITAL_BASED_OUTPATIENT_CLINIC_OR_DEPARTMENT_OTHER)

## 2024-05-14 DIAGNOSIS — M6281 Muscle weakness (generalized): Secondary | ICD-10-CM

## 2024-05-14 DIAGNOSIS — R262 Difficulty in walking, not elsewhere classified: Secondary | ICD-10-CM

## 2024-05-14 DIAGNOSIS — M25551 Pain in right hip: Secondary | ICD-10-CM | POA: Diagnosis not present

## 2024-05-14 NOTE — Therapy (Addendum)
 OUTPATIENT PHYSICAL THERAPY LOWER EXTREMITY TREATMENT DISCHARGE   Patient Name: Becky Cox MRN: [id number] DOB:11/08/1951, 73 y.o., female Today's Date: 05/14/2024  END OF SESSION:  PT End of Session - 05/14/24 1139     Visit Number 17    Number of Visits 17    Date for PT Re-Evaluation 05/14/24    Authorization Type [employer] M[employer]    Authorization Time Period 05/06/2024-05/18/2024    Authorization - Visit Number 2    Authorization - Number of Visits 5    PT Start Time 1055    PT Stop Time 1138    PT Time Calculation (min) 43 min    Activity Tolerance Patient tolerated treatment well    Behavior During Therapy WFL for tasks assessed/performed                      Past Medical History:  Diagnosis Date   Fibroid    Hypertension    Thyroid disease    Nodules   Past Surgical History:  Procedure Laterality Date   ABDOMINAL HYSTERECTOMY  1996   ANKLE SURGERY  04/2011   BONE SPURS IN RIGHT ANKLE   BREAST BIOPSY Left 2019   benign   BREAST BIOPSY Right 2021   FOOT SURGERY  2007 2008   KNEE SURGERY     Arthroscopic   MOLE REMOVAL  2009   BENIGN   THYROID CYST EXCISION     2001   Patient Active Problem List   Diagnosis Date Noted   Coordination of complex care 10/03/2022   History of bone marrow biopsy 10/03/2022   Rheumatoid arthritis (HCC) 10/03/2022   Lymphoproliferative disorder (HCC) 09/23/2022   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 No  LIVING ENVIRONMENT: Lives with: lives alone Lives in: 1 story home Stairs:  3 steps and 2 rails   OCCUPATION: Pt is retired.  PLOF: Independent.  Pt performs stairs with a step to gait pattern.  PATIENT GOALS: improved walking, performing stairs, and pain.  Feeling good after she exercises.    OBJECTIVE:  Note: Objective measures were completed at Evaluation unless otherwise noted.  DIAGNOSTIC FINDINGS:  Hip x ray: FINDINGS: Osteoarthrosis with significant narrowing of the superolateral compartment of the joint space without evidence of fracture or other bony abnormalities.   IMPRESSION: Osteoarthrosis.  PATIENT SURVEYS:  LEFS:  INITIAL / Prior / CURRENT:  39/80 /  46/80  / 43/80                 LOWER EXTREMITY MMT:   MMT Right eval Left eval Right 5/14 Left 5/14 Right 6/10 Left 6/10  Hip flexion 5/5 5/5        Hip extension            Hip abduction 4/5 4/5 4/5 4/5 4/5 4/5  Hip adduction            Hip internal rotation 5/5 w/n available ROM 5/5        Hip external rotation 5/5 4/5 w/n available ROM        Knee flexion            Knee extension 4+/5 5/5     4+/5   Ankle dorsiflexion            Ankle plantarflexion            Ankle inversion            Ankle eversion             (Blank rows = not tested)        GAIT: Assistive device utilized: None Level of assistance: Complete Independence Comments:  does favor R LE some, improved hip extension bilat    TREATMENT:   6/17  Nustep L4 x 5 mins UE/Les  Manual Therapy: STM and rolling to R glute and R ITB in S/L'ing with pillow b/w knees  S/l clams 2x10 Bridge 2x10 LAQ 4# with 5 sec hold x 10, 5# x10 Sidestepping x 3laps with RTB around thighs Retro walking along rail x3 laps Step ups on 2 inch step 2x10 Stairs (step to pattern) 1 lap walking around track upstairs Review of HEP      PATIENT EDUCATION:  Education details:  set up of gym machines.  Gait and strength findings.  dx, relevant anatomy, rationale of interventions, exercise form, and POC.  PT answered pt's questions.  Person educated: Patient Education method: Explanation, Demonstration, Tactile cues, Verbal cues, and Handouts Education comprehension: verbalized understanding, returned demonstration, verbal cues required, tactile cues required, and needs further education  HOME EXERCISE PROGRAM: Access Code: [email or web address] URL: [email or web address] Date: 02/20/2024 Prepared by: Marnie Siren  Exercises - Supine Bridge  - 1 x daily - 6-7 x weekly - 2 sets - 10 reps - Clamshell  - 1 x daily - 6-7 x weekly - 2 sets - 10 reps - Seated Hamstring Stretch  - 1-2 x daily - 7 x weekly - 2-3 reps - 20 seconds hold - Standing Glute/piriformis Mobilization with Small Ball on Wall   ASSESSMENT:  CLINICAL IMPRESSION: Pt has attended visits thus far and has made great progress with PT. She reports 80% improvement at this time. Has difficulty with stair climbing and requires step to pattern. Able to complete full lap walking on track upstairs. She has met 3/4 STG and 3/4 LTG. Pt will be d/c from PT as she is now independent with HEP and gym program. Pt is appropriate for continuing strengthening on her own.    OBJECTIVE IMPAIRMENTS: Abnormal gait, decreased activity  tolerance, decreased endurance, decreased mobility, difficulty walking, decreased ROM, decreased strength, hypomobility, and pain.   ACTIVITY LIMITATIONS: standing, squatting, stairs, transfers, dressing, and locomotion level  PARTICIPATION LIMITATIONS: community activity and workout activities  PERSONAL FACTORS: 3+ comorbidities: RA, L knee OA, R  knee and ankle surgery are also affecting patient's functional outcome.   REHAB POTENTIAL: Good  CLINICAL DECISION MAKING: Stable/uncomplicated  EVALUATION COMPLEXITY: Low   GOALS:  SHORT TERM GOALS: Target date: 03/12/2024  Pt will be independent and compliant with HEP for improved pain, strength, and function.  Baseline: Goal status: GOAL MET  5/14  2.  Pt will demo improved quality of gait including increased step length and increasedWb'ing thru R LE for improved performance of community ambulation.  Baseline:  Goal status: GOAL MET  5/14  3.  Pt will demo a significant improvement in self perceived disability with LEFS improving by at least 9 points.   Baseline:  Goal status: PROGRESSING  5/14 Target date:  03/19/2024  4.  Pt will report at least a 30% improvement in pain and sx's overall.  Baseline:  Goal status: GOAL MET  5/14    LONG TERM GOALS: Target date: 05/01/2024  Pt will demo improved strength to 5/5 in R hip abduction and knee extension for improved performance of and tolerance with functional mobility.  Baseline:  Goal status: NOT MET   2.  Pt will report she is able to ambulate extended community distance without significant pain and difficulty.   Baseline:  Goal status: GOAL MET  6/4  3.  Pt will be able to perform all of her normal daily transfers without significant difficulty and pain.  Baseline:  Goal status:  GOAL MET  6/4  4.  Pt will have improved tolerance with exercise classes and also demonstrate understanding in modifying/restricting certain movements and exercises in classes in order to have a  consistent exercise routine without exacerbating hip pain.   Baseline:  Goal status: GOAL MET  5/14   PLAN:  PT FREQUENCY: 2x/week  PT DURATION: 4 weeks  PLANNED INTERVENTIONS:  97164- PT Re-evaluation, 97110-Therapeutic exercises, 97530- Therapeutic activity, 97112- Neuromuscular re-education, 97535- Self Care, 16109- Manual therapy, [id number]- Gait training, [phone]- Aquatic Therapy, [phone]- Electrical stimulation (unattended), [id number]- Electrical stimulation (manual), [id number]- Ultrasound, Patient/Family education, Balance training, Stair training, Taping, Dry Needling, Cryotherapy, and Moist heat  PLAN FOR NEXT SESSION:  See below.     Herb Loges, PTA  05/14/24 2:59 PM  PHYSICAL THERAPY DISCHARGE SUMMARY  Visits from Start of Care: 17  Current functional level related to goals / functional outcomes: See above   Remaining deficits: See above   Education / Equipment: See above   Patient will be discharged from skilled PT due to meeting the majority of goals and being independent with HEP.  Pt is agreeable with discharge.  She is independent and will cont with HEP.    Trina Fujita III PT, DPT 05/15/24 10:27 PM

## 2024-05-15 NOTE — Addendum Note (Signed)
 Addended by: Grier Leber on: 05/15/2024 10:29 PM   Modules accepted: Orders

## 2024-05-17 ENCOUNTER — Other Ambulatory Visit: Payer: Self-pay | Admitting: Internal Medicine

## 2024-05-17 DIAGNOSIS — Z1231 Encounter for screening mammogram for malignant neoplasm of breast: Secondary | ICD-10-CM

## 2024-05-21 ENCOUNTER — Other Ambulatory Visit: Payer: Medicare PPO

## 2024-06-03 DIAGNOSIS — E669 Obesity, unspecified: Secondary | ICD-10-CM | POA: Insufficient documentation

## 2024-06-03 DIAGNOSIS — E559 Vitamin D deficiency, unspecified: Secondary | ICD-10-CM | POA: Insufficient documentation

## 2024-06-19 ENCOUNTER — Ambulatory Visit (HOSPITAL_BASED_OUTPATIENT_CLINIC_OR_DEPARTMENT_OTHER)
Admission: RE | Admit: 2024-06-19 | Discharge: 2024-06-19 | Disposition: A | Discharge status: Home / Self Care | Source: Ambulatory Visit | Attending: Internal Medicine | Admitting: Internal Medicine

## 2024-06-19 DIAGNOSIS — Z13820 Encounter for screening for osteoporosis: Secondary | ICD-10-CM | POA: Diagnosis present

## 2024-06-19 DIAGNOSIS — Z780 Asymptomatic menopausal state: Secondary | ICD-10-CM | POA: Insufficient documentation

## 2024-07-01 ENCOUNTER — Ambulatory Visit
Admission: RE | Admit: 2024-07-01 | Discharge: 2024-07-01 | Disposition: A | Discharge status: Home / Self Care | Source: Ambulatory Visit | Attending: Internal Medicine | Admitting: Internal Medicine

## 2024-07-01 DIAGNOSIS — Z1231 Encounter for screening mammogram for malignant neoplasm of breast: Secondary | ICD-10-CM

## 2024-07-05 ENCOUNTER — Other Ambulatory Visit

## 2024-07-05 ENCOUNTER — Ambulatory Visit: Admitting: Oncology

## 2024-07-12 ENCOUNTER — Inpatient Hospital Stay: Admitting: Oncology

## 2024-07-12 ENCOUNTER — Encounter: Payer: Self-pay | Admitting: Oncology

## 2024-07-12 ENCOUNTER — Inpatient Hospital Stay: Attending: Oncology

## 2024-07-12 VITALS — BP 122/62 | HR 71 | Temp 97.3°F | Resp 13 | Wt 224.5 lb

## 2024-07-12 DIAGNOSIS — K529 Noninfective gastroenteritis and colitis, unspecified: Secondary | ICD-10-CM | POA: Insufficient documentation

## 2024-07-12 DIAGNOSIS — I10 Essential (primary) hypertension: Secondary | ICD-10-CM | POA: Insufficient documentation

## 2024-07-12 DIAGNOSIS — R768 Other specified abnormal immunological findings in serum: Secondary | ICD-10-CM

## 2024-07-12 DIAGNOSIS — D5912 Cold autoimmune hemolytic anemia: Secondary | ICD-10-CM | POA: Insufficient documentation

## 2024-07-12 DIAGNOSIS — Z8249 Family history of ischemic heart disease and other diseases of the circulatory system: Secondary | ICD-10-CM | POA: Diagnosis not present

## 2024-07-12 DIAGNOSIS — E039 Hypothyroidism, unspecified: Secondary | ICD-10-CM | POA: Diagnosis not present

## 2024-07-12 DIAGNOSIS — M109 Gout, unspecified: Secondary | ICD-10-CM | POA: Diagnosis not present

## 2024-07-12 DIAGNOSIS — Z79899 Other long term (current) drug therapy: Secondary | ICD-10-CM | POA: Diagnosis not present

## 2024-07-12 DIAGNOSIS — M069 Rheumatoid arthritis, unspecified: Secondary | ICD-10-CM | POA: Diagnosis not present

## 2024-07-12 DIAGNOSIS — G4733 Obstructive sleep apnea (adult) (pediatric): Secondary | ICD-10-CM | POA: Diagnosis not present

## 2024-07-12 DIAGNOSIS — Z800 Family history of malignant neoplasm of digestive organs: Secondary | ICD-10-CM | POA: Insufficient documentation

## 2024-07-12 DIAGNOSIS — Z79890 Hormone replacement therapy (postmenopausal): Secondary | ICD-10-CM | POA: Insufficient documentation

## 2024-07-12 DIAGNOSIS — H9193 Unspecified hearing loss, bilateral: Secondary | ICD-10-CM | POA: Diagnosis not present

## 2024-07-12 LAB — COMPREHENSIVE METABOLIC PANEL WITH GFR
ALT: 19 U/L (ref 0–44)
AST: 22 U/L (ref 15–41)
Albumin: 4.1 g/dL (ref 3.5–5.0)
Alkaline Phosphatase: 99 U/L (ref 38–126)
Anion gap: 7 (ref 5–15)
BUN: 19 mg/dL (ref 8–23)
CO2: 27 mmol/L (ref 22–32)
Calcium: 9.3 mg/dL (ref 8.9–10.3)
Chloride: 108 mmol/L (ref 98–111)
Creatinine, Ser: 0.85 mg/dL (ref 0.44–1.00)
GFR, Estimated: >60 mL/min (ref >60)
Glucose, Bld: 89 mg/dL (ref 70–99)
Potassium: 3.9 mmol/L (ref 3.5–5.1)
Sodium: 142 mmol/L (ref 135–145)
Total Bilirubin: 2 mg/dL — ABNORMAL HIGH (ref 0.0–1.2)
Total Protein: 6.3 g/dL — ABNORMAL LOW (ref 6.5–8.1)

## 2024-07-12 LAB — CBC WITH DIFFERENTIAL/PLATELET
Abs Immature Granulocytes: 0.01 K/uL (ref 0.00–0.07)
Basophils Absolute: 0 K/uL (ref 0.0–0.1)
Basophils Relative: 1 %
Eosinophils Absolute: 0.4 K/uL (ref 0.0–0.5)
Eosinophils Relative: 6 %
HCT: 36.3 % (ref 36.0–46.0)
Hemoglobin: 12 g/dL (ref 12.0–15.0)
Immature Granulocytes: 0 %
Lymphocytes Relative: 44 %
Lymphs Abs: 2.8 K/uL (ref 0.7–4.0)
MCH: 29.5 pg (ref 26.0–34.0)
MCHC: 33.1 g/dL (ref 30.0–36.0)
MCV: 89.2 fL (ref 80.0–100.0)
Monocytes Absolute: 0.4 K/uL (ref 0.1–1.0)
Monocytes Relative: 6 %
Neutro Abs: 2.7 K/uL (ref 1.7–7.7)
Neutrophils Relative %: 43 %
Platelets: 310 K/uL (ref 150–400)
RBC: 4.07 MIL/uL (ref 3.87–5.11)
RDW: 15.5 % (ref 11.5–15.5)
WBC: 6.4 K/uL (ref 4.0–10.5)
nRBC: 0 % (ref 0.0–0.2)

## 2024-07-12 LAB — RETICULOCYTES
Immature Retic Fract: 7.8 % (ref 2.3–15.9)
RBC.: 4.05 MIL/uL (ref 3.87–5.11)
Retic Count, Absolute: 59.9 K/uL (ref 19.0–186.0)
Retic Ct Pct: 1.5 % (ref 0.4–3.1)

## 2024-07-12 LAB — LACTATE DEHYDROGENASE: LDH: 168 U/L (ref 98–192)

## 2024-07-12 NOTE — Progress Notes (Signed)
 Becky Cox  HEMATOLOGY CLINIC PROGRESS NOTE  PATIENT NAME: Becky Cox   MR#: [id number] DOB: Jul 09, 1951  Patient Care Team: Becky Ezekiel NOVAK, MD as PCP - General (Internal Medicine) Becky Guillermina BROCKS, MD (Inactive) as Attending Physician (Hematology)  Date of visit: 07/12/2024   ASSESSMENT & PLAN:   Becky Cox is a 73 y.o. 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  Cold agglutinins present Stable with intermittent fatigue. Hemoglobin stable at 12 currently. LDH normal. Haptoglobin was within normal limits on last visit.  Repeat value is pending from today.  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 No signs of active hemolysis. Blood counts, including white blood cells and platelets, are normal. Chemistries are within normal limits.  -Last rituximab  infusion was in December 2023.  -Consider repeat rituximab  infusions if cold agglutinin titer increases.  Decision on another rituximab  course will depend on hemoglobin trends and titer levels, not solely on cold agglutinin value.  -She was advised to contact clinic if fatigue worsens.  -Advise to maintain physical activity as tolerated.  -Recommend pool temperature closer to 90 degrees to avoid cold exposure.  RTC in 3 months for follow-up with repeat labs.   General Health Maintenance Routine health maintenance is current.  Screening mammogram on 07/01/2024 showed BI-RADS 1, benign findings.  I spent a total of 20 minutes during this encounter with the patient including review of chart and various tests results, discussions about plan of care and coordination of care plan.  I reviewed lab results and outside records for this visit and discussed  relevant results with the patient. Diagnosis, plan of care and treatment options were also discussed in detail with the patient. Opportunity provided to ask questions and answers provided to her apparent satisfaction. Provided instructions to call our clinic with any problems, questions or concerns prior to return visit. I recommended to continue follow-up with PCP and sub-specialists. She verbalized understanding and agreed with the plan. No barriers to learning was detected.  Becky Patten, MD  07/12/2024 11:20 AM  [facility] - A DEPT OF [name] [name] [name] [address] Dept: [phone] Dept Fax: [phone]   CHIEF COMPLAINT/ REASON FOR VISIT:  Follow-up for cold agglutinin disease.  INTERVAL HISTORY:  Discussed the use of AI scribe software for clinical note transcription with the patient, who gave verbal consent to proceed.  History of Present Illness Becky Cox is a 73 year old female with anemia who presents for routine follow-up.  She feels 'a little tired' but has no major health concerns over the past three months. Her most recent hemoglobin was 12, similar to her previous results. Her white blood cell count and platelet levels have been reported as normal in recent labs.  She underwent a mammogram earlier this month.  She engages in swimming once or twice a week, ensuring the pool temperature is around ninety degrees.    SUMMARY OF HEMATOLOGIC HISTORY:  She is being followed in our clinic for anemia and cold agglutinin disease.  Other comorbidities include hypothyroidism, obstructive sleep apnea, bilateral hearing loss, hypertension, rheumatoid arthritis, chronic diarrhea, hypertension, gout.   I reviewed the patient's records extensively and collaborated the history with the patient.  Summary of her history is as follows:   March 22 2022:  Colonoscopy.  3 very small polyps    July 29, 2022: WBC 7.2 hemoglobin 10.3 MCV 93 platelet count 311; 45 segs 44 lymphs 5 monos 5 eos 1 basophil.  Specimen was prewarmed to 37 degrees to obtain results.  Technician suspected possibility of cold agglutinins/cryoglobulins present Chemistries notable for creatinine 0.79 albumin 3.9.  AST ALT and alk phos normal T. bili 2.3 direct bilirubin 0.6.  Reticulocyte count 2.8%   September 09 2022:  [facility] Was told years ago that she was anemic.   Has never taken oral iron.  No history of IV iron or PRBC's.   Does not take ASA.  Mainly takes Tylenol  for pain; rarely ibuprofen No pica to ice.  Uses CPAP every night   Social:  Divorced.  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  Tobacco none.  EtOH   [facility] Mother died 23 MI Father died 49 pneumonia Sister died 79 CVA, MS Sister alive 20 colon cancer Sister alive 45 Parkinson's disease Sister alive 60 HTN Sister alive 58 osteoarthritis Brother alive 48 TBI from motorcycle accident, HTN Brother died 21 CVA Brother died 69 colon cancer   WBC 9.6 hemoglobin 10.2 MCV 97 platelet count 346; 55 segs 35 lymphs 5 monos 4 eos 1 basophil.  Reticulocyte count 3.3% Coombs test complement positive/IgG negative.  Haptoglobin undetectable.  Cold agglutinin titer greater than 1:4096 Hemoglobin electrophoresis normal adult pattern. SPEP with IEP showed no paraprotein. IgG 624 IgA 121 IgM 74 Serum free kappa 25.1 lambda 28.3 with a kappa lambda 0.89 B12 500 folate 27.3 ferritin 242 Copper  128 zinc  97 CMP notable for T. bili of 3.3 glucose 102   September 23, 2022: Underwent bone marrow biopsy and aspirate Pathology revealed A minor CD10 positive B-cell population(1% of the lymphocytes) with kappa excess. Cytogenetics Normal female Karyotype  Neo Complrehensive Myeloid disorder panel normal     September 30, 2022: Scheduled follow-up for management of cold agglutinin disease.  Did not have a lot of discomfort  following the bone marrow Reviewed results of labs and bone marrow results with patient.  Experiencing fatigue.  Having myalgias of legs.  Notes that her urine is dark   October 07 2022:  Rituxan  375 mg/m2  Hgb 8.8 October 14 2022  Rituxan  375 mg/m2.  Hgb 9.2  October 21 2022:  Rituxan  375 mg/m2  Hgb 10.1   October 28 2022:   Had a minor infusion rxn during first Rituxan  infusion but was able to complete it and receive subsequent infusions.     December 09 2022:   Reviewed results of labs with patient and granddaughter.  Feels well.  Had COVID around Christmas for which she took Paxlovid.     WBC 5.5 hemoglobin 11.7 MCV 94.7 platelet count 289; 56 segs 32 lymphs 6 monos 5 eos 1 basophil reticulocyte count 1.6% Coombs positive for complement negative for IgG.  Haptoglobin 74 LDH 255 CMP notable for total protein 5.9   March 10 2023:   Feels well albeit a bit fatigued.  Reviewed results of labs with patient Hgb 11.4.  Cold agglutinin titer 1:1024 Haptoglobin 101  LDH 210   May 05 2023:    To retire at the end of the month but has plans to keep busy.  Reviewed results of labs with patient.  Feels well Hgb 11.4 Retic 2.3 Tbili 2.1 LDH 217    July 07 2023:  Scheduled  follow-up for management of cold agglutinin disease.   Now retired.   Feels well.    Hg 12.1   T bili 1.7  LDH 184  I have reviewed the past medical history, past surgical history, social history and family history with the patient and they are unchanged from previous note.  ALLERGIES: She is allergic to rituximab .  MEDICATIONS:  Current Outpatient Medications  Medication Sig Dispense Refill   allopurinol (ZYLOPRIM) 300 MG tablet Take 1 tablet by mouth daily.     amLODipine (NORVASC) 5 MG tablet Take 10 tablets by mouth daily.      Ascorbic Acid (VITAMIN C PO) Take by mouth.     atorvastatin (LIPITOR) 10 MG tablet Take 1 tablet by mouth daily.     diclofenac  Sodium (VOLTAREN ) 1 % GEL Apply topically.     fluticasone  (FLONASE) 50 MCG/ACT nasal spray Place 2 sprays into the nose daily.     folic acid  (FOLVITE ) 1 MG tablet Take 1 mg by mouth daily.     ipratropium (ATROVENT ) 0.06 % nasal spray Place 2 sprays into both nostrils 4 (four) times daily. 15 mL 2   levothyroxine (SYNTHROID) 100 MCG tablet Take 100 mcg by mouth daily.     methotrexate (RHEUMATREX) 2.5 MG tablet take 6 tablets     montelukast (SINGULAIR) 10 MG tablet Take 1 tablet by mouth at bedtime.     Multiple Vitamin (MULTIVITAMIN PO) Take 1 tablet by mouth daily.     olmesartan (BENICAR) 20 MG tablet Take 20 mg by mouth daily.     triamterene-hydrochlorothiazide (MAXZIDE-25) 37.5-25 MG tablet Take 1 tablet by mouth daily.     No current facility-administered medications for this visit.     REVIEW OF SYSTEMS:    ROS  All other pertinent systems were reviewed with the patient and are negative.  PHYSICAL EXAMINATION:   Onc Performance Status - 07/12/24 0955       ECOG Perf Status   ECOG Perf Status Fully active, able to carry on all pre-disease performance without restriction      KPS SCALE   KPS % SCORE Normal, no compliants, no evidence of disease           Vitals:   07/12/24 0938  BP: 122/62  Pulse: 71  Resp: 13  Temp: (!) 97.3 F (36.3 C)  SpO2: 98%   Filed Weights   07/12/24 0938  Weight: 224 lb 8 oz (101.8 kg)    Physical Exam Constitutional:      General: She is not in acute distress.    Appearance: Normal appearance.  HENT:     Head: Normocephalic and atraumatic.  Eyes:     General: No scleral icterus.    Conjunctiva/sclera: Conjunctivae normal.  Cardiovascular:     Rate and Rhythm: Normal rate and regular rhythm.     Heart sounds: Normal heart sounds.  Pulmonary:     Effort: Pulmonary effort is normal.     Breath sounds: Normal breath sounds.  Abdominal:     General: There is no distension.  Musculoskeletal:     Right lower leg: No edema.     Left lower leg: No edema.  Neurological:      General: No focal deficit present.     Mental Status: She is alert and oriented to person, place, and time.  Psychiatric:        Mood and Affect: Mood normal.        Behavior: Behavior normal.  Thought Content: Thought content normal.     LABORATORY DATA:   I have reviewed the data as listed.  Results for orders placed or performed in visit on 07/12/24  CBC with Differential/Platelet  Result Value Ref Range   WBC 6.4 4.0 - 10.5 K/uL   RBC 4.07 3.87 - 5.11 MIL/uL   Hemoglobin 12.0 12.0 - 15.0 g/dL   HCT 63.6 63.9 - 53.9 %   MCV 89.2 80.0 - 100.0 fL   MCH 29.5 26.0 - 34.0 pg   MCHC 33.1 30.0 - 36.0 g/dL   RDW 84.4 88.4 - 84.4 %   Platelets 310 150 - 400 K/uL   nRBC 0.0 0.0 - 0.2 %   Neutrophils Relative % 43 %   Neutro Abs 2.7 1.7 - 7.7 K/uL   Lymphocytes Relative 44 %   Lymphs Abs 2.8 0.7 - 4.0 K/uL   Monocytes Relative 6 %   Monocytes Absolute 0.4 0.1 - 1.0 K/uL   Eosinophils Relative 6 %   Eosinophils Absolute 0.4 0.0 - 0.5 K/uL   Basophils Relative 1 %   Basophils Absolute 0.0 0.0 - 0.1 K/uL   Immature Granulocytes 0 %   Abs Immature Granulocytes 0.01 0.00 - 0.07 K/uL  Comprehensive metabolic panel  Result Value Ref Range   Sodium 142 135 - 145 mmol/L   Potassium 3.9 3.5 - 5.1 mmol/L   Chloride 108 98 - 111 mmol/L   CO2 27 22 - 32 mmol/L   Glucose, Bld 89 70 - 99 mg/dL   BUN 19 8 - 23 mg/dL   Creatinine, Ser 9.14 0.44 - 1.00 mg/dL   Calcium 9.3 8.9 - 89.6 mg/dL   Total Protein 6.3 (L) 6.5 - 8.1 g/dL   Albumin 4.1 3.5 - 5.0 g/dL   AST 22 15 - 41 U/L   ALT 19 0 - 44 U/L   Alkaline Phosphatase 99 38 - 126 U/L   Total Bilirubin 2.0 (H) 0.0 - 1.2 mg/dL   GFR, Estimated >39 >39 mL/min   Anion gap 7 5 - 15  Lactate dehydrogenase  Result Value Ref Range   LDH 168 98 - 192 U/L  Reticulocytes  Result Value Ref Range   Retic Ct Pct 1.5 0.4 - 3.1 %   RBC. 4.05 3.87 - 5.11 MIL/uL   Retic Count, Absolute 59.9 19.0 - 186.0 K/uL   Immature Retic Fract 7.8 2.3 -  15.9 %     RADIOGRAPHIC STUDIES:  No recent pertinent imaging studies available to review.  Orders Placed This Encounter  Procedures   CBC with Differential ([facility] Only)    Standing Status:   Future    Expected Date:   10/12/2024    Expiration Date:   01/10/2025   CMP ([facility] only)    Standing Status:   Future    Expected Date:   10/12/2024    Expiration Date:   01/10/2025   Lactate dehydrogenase    Standing Status:   Future    Expected Date:   10/12/2024    Expiration Date:   01/10/2025   Haptoglobin    Standing Status:   Future    Expected Date:   10/12/2024    Expiration Date:   01/10/2025   Cold agglutinin titer    Standing Status:   Future    Expected Date:   10/12/2024    Expiration Date:   01/10/2025     Future Appointments  Date Time Provider Department [facility]  08/06/2024  2:30 PM Alm Delon SAILOR, DO [facility] None  10/11/2024  8:45 AM [facility] None  10/11/2024  9:00 AM Sanyah Molnar, Chinita, MD [facility] None     This document was completed utilizing speech recognition software. Grammatical errors, random word insertions, pronoun errors, and incomplete sentences are an occasional consequence of this system due to software limitations, ambient noise, and hardware issues. Any formal questions or concerns about the content, text or information contained within the body of this dictation should be directly addressed to the provider for clarification.

## 2024-07-12 NOTE — Assessment & Plan Note (Addendum)
 Stable with intermittent fatigue. Hemoglobin stable at 12 currently. LDH normal. Haptoglobin was within normal limits on last visit.  Repeat value is pending from today.  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 No signs of active hemolysis. Blood counts, including white blood cells and platelets, are normal. Chemistries are within normal limits.  -Last rituximab  infusion was in December 2023.  -Consider repeat rituximab  infusions if cold agglutinin titer increases.  Decision on another rituximab  course will depend on hemoglobin trends and titer levels, not solely on cold agglutinin value.  -She was advised to contact clinic if fatigue worsens.  -Advise to maintain physical activity as tolerated.  -Recommend pool temperature closer to 90 degrees to avoid cold exposure.  RTC in 3 months for follow-up with repeat labs.

## 2024-07-13 LAB — HAPTOGLOBIN: Haptoglobin: 182 mg/dL (ref 42–346)

## 2024-07-15 LAB — COLD AGGLUTININ TITER: Cold Agglutinin Titer: 1:256 {titer} — ABNORMAL HIGH

## 2024-08-06 ENCOUNTER — Encounter: Payer: Self-pay | Admitting: Dermatology

## 2024-08-06 ENCOUNTER — Ambulatory Visit: Payer: Medicare PPO | Admitting: Dermatology

## 2024-08-06 VITALS — BP 106/56 | HR 74

## 2024-08-06 DIAGNOSIS — L918 Other hypertrophic disorders of the skin: Secondary | ICD-10-CM

## 2024-08-06 DIAGNOSIS — D239 Other benign neoplasm of skin, unspecified: Secondary | ICD-10-CM | POA: Diagnosis not present

## 2024-08-06 DIAGNOSIS — L578 Other skin changes due to chronic exposure to nonionizing radiation: Secondary | ICD-10-CM

## 2024-08-06 DIAGNOSIS — D485 Neoplasm of uncertain behavior of skin: Secondary | ICD-10-CM

## 2024-08-06 DIAGNOSIS — W908XXA Exposure to other nonionizing radiation, initial encounter: Secondary | ICD-10-CM

## 2024-08-06 DIAGNOSIS — Z1283 Encounter for screening for malignant neoplasm of skin: Secondary | ICD-10-CM

## 2024-08-06 DIAGNOSIS — D229 Melanocytic nevi, unspecified: Secondary | ICD-10-CM

## 2024-08-06 DIAGNOSIS — L814 Other melanin hyperpigmentation: Secondary | ICD-10-CM

## 2024-08-06 DIAGNOSIS — D2262 Melanocytic nevi of left upper limb, including shoulder: Secondary | ICD-10-CM

## 2024-08-06 DIAGNOSIS — D225 Melanocytic nevi of trunk: Secondary | ICD-10-CM | POA: Diagnosis not present

## 2024-08-06 DIAGNOSIS — L821 Other seborrheic keratosis: Secondary | ICD-10-CM

## 2024-08-06 NOTE — Progress Notes (Signed)
 New Patient Visit   Subjective  Becky Cox is a 73 y.o. female who presents for the following: Upper Body Skin Exam (UBSE)  Patient present today for new patient visit for UBSE.The patient reports she has spots, moles and lesions to be evaluated, some may be new or changing and the patient may have concern these could be cancer. Patient has previously been treated by a dermatologist. Patient reports she has hx of bx. Patient denies family history of skin cancers. Patient reports throughout her lifetime has had minimal sun exposure. Currently, patient reports if she has excessive sun exposure, she does apply sunscreen and/or wears protective coverings.  The following portions of the chart were reviewed this encounter and updated as appropriate: medications, allergies, medical history  Review of Systems:  No other skin or systemic complaints except as noted in HPI or Assessment and Plan.  Objective  Well appearing patient in no apparent distress; mood and affect are within normal limits.  A full examination was performed including scalp, head, eyes, ears, nose, lips, neck, chest, axillae, abdomen, back,  bilateral upper extremities, bilateral lower extremities, hands, feet, fingers, toes, fingernails, and toenails. All findings within normal limits unless otherwise noted below.   Relevant exam findings are noted in the Assessment and Plan.           Left Hand Palm 5 mm dark brown papule Left Breast 1 cm irregular brown macule  Assessment & Plan   LENTIGINES, SEBORRHEIC KERATOSES, HEMANGIOMAS - Benign normal skin lesions - Benign-appearing - Call for any changes  MELANOCYTIC NEVI - Tan-brown and/or pink-flesh-colored symmetric macules and papules - Benign appearing on exam today - Observation - Call clinic for new or changing moles - Recommend daily use of broad spectrum spf 30+ sunscreen to sun-exposed areas.   ACTINIC DAMAGE - Chronic condition, secondary to  cumulative UV/sun exposure - diffuse scaly erythematous macules with underlying dyspigmentation - Recommend daily broad spectrum sunscreen SPF 30+ to sun-exposed areas, reapply every 2 hours as needed.  - Staying in the shade or wearing long sleeves, sun glasses (UVA+UVB protection) and wide brim hats (4-inch brim around the entire circumference of the hat) are also recommended for sun protection.  - Call for new or changing lesions.  Dermatosis Papulosa Nigra  Skin Care: Dermatosis Papulosa Ferol can resolve with light electrodesiccation. Expectations: Dermatosis Papulosa Ferol are benign growths. No treatment is necessary. Plan: Cosmetic Quote The patient received the following cosmetic quote  Acrochordons (Skin Tags) - Fleshy, skin-colored pedunculated papules - Benign appearing.  - Observe. - If desired, they can be removed with an in office procedure that is not covered by insurance. - Please call the clinic if you notice any new or changing lesions.   DERMATOFIBROMA Exam: Firm pink/brown papulenodule with dimple sign. Treatment Plan: A dermatofibroma is a benign growth possibly related to trauma, such as an insect bite, cut from shaving, or inflamed acne-type bump.  Treatment options to remove include shave or excision with resulting scar and risk of recurrence.  Since benign-appearing and not bothersome, will observe for now.    SKIN CANCER SCREENING PERFORMED TODAY  NEOPLASM OF UNCERTAIN BEHAVIOR OF SKIN (2) Left Hand Palm Epidermal / dermal shaving  Lesion diameter (cm):  0.5 Informed consent: discussed and consent obtained   Timeout: patient name, date of birth, surgical site, and procedure verified   Procedure prep:  Patient was prepped and draped in usual sterile fashion Prep type:  Isopropyl alcohol Anesthesia: the lesion was  anesthetized in a standard fashion   Anesthetic:  1% lidocaine w/ epinephrine  1-100,000 buffered w/ 8.4% NaHCO3 Instrument used: DermaBlade    Hemostasis achieved with: pressure and aluminum chloride   Outcome: patient tolerated procedure well   Post-procedure details: sterile dressing applied and wound care instructions given   Dressing type: bandage and petrolatum    Specimen A - Surgical pathology Differential Diagnosis: R/O DN  Check Margins: No Left Breast Epidermal / dermal shaving  Lesion diameter (cm):  1 Informed consent: discussed and consent obtained   Timeout: patient name, date of birth, surgical site, and procedure verified   Procedure prep:  Patient was prepped and draped in usual sterile fashion Prep type:  Isopropyl alcohol Anesthesia: the lesion was anesthetized in a standard fashion   Anesthetic:  1% lidocaine w/ epinephrine  1-100,000 buffered w/ 8.4% NaHCO3 Instrument used: DermaBlade   Hemostasis achieved with: pressure and aluminum chloride   Outcome: patient tolerated procedure well   Post-procedure details: sterile dressing applied and wound care instructions given   Dressing type: bandage and petrolatum    Specimen B - Surgical pathology Differential Diagnosis: R/O DN  Check Margins: No  Return in about 1 year (around 08/06/2025) for TBSE.  I, Jetta Ager, am acting as Neurosurgeon for [employer], DO.  Documentation: I have reviewed the above documentation for accuracy and completeness, and I agree with the above.  Delon Lenis, DO

## 2024-08-06 NOTE — Patient Instructions (Addendum)

## 2024-08-08 LAB — SURGICAL PATHOLOGY

## 2024-08-13 ENCOUNTER — Ambulatory Visit: Payer: Self-pay | Admitting: Dermatology

## 2024-10-11 ENCOUNTER — Inpatient Hospital Stay: Admitting: Oncology

## 2024-10-11 ENCOUNTER — Inpatient Hospital Stay: Attending: Oncology

## 2024-10-11 VITALS — BP 112/55 | HR 63 | Temp 97.9°F | Resp 18 | Wt 210.7 lb

## 2024-10-11 DIAGNOSIS — D5912 Cold autoimmune hemolytic anemia: Secondary | ICD-10-CM | POA: Insufficient documentation

## 2024-10-11 DIAGNOSIS — R7689 Other specified abnormal immunological findings in serum: Secondary | ICD-10-CM | POA: Diagnosis not present

## 2024-10-11 LAB — RETICULOCYTES
Immature Retic Fract: 8.8 % (ref 2.3–15.9)
RBC.: 3.94 MIL/uL (ref 3.87–5.11)
Retic Count, Absolute: 72.5 K/uL (ref 19.0–186.0)
Retic Ct Pct: 1.8 % (ref 0.4–3.1)

## 2024-10-11 LAB — CMP (CANCER CENTER ONLY)
ALT: 18 U/L (ref 0–44)
AST: 23 U/L (ref 15–41)
Albumin: 3.9 g/dL (ref 3.5–5.0)
Alkaline Phosphatase: 90 U/L (ref 38–126)
Anion gap: 6 (ref 5–15)
BUN: 25 mg/dL — ABNORMAL HIGH (ref 8–23)
CO2: 27 mmol/L (ref 22–32)
Calcium: 9.2 mg/dL (ref 8.9–10.3)
Chloride: 107 mmol/L (ref 98–111)
Creatinine: 0.81 mg/dL (ref 0.44–1.00)
GFR, Estimated: >60 mL/min (ref >60)
Glucose, Bld: 83 mg/dL (ref 70–99)
Potassium: 3.6 mmol/L (ref 3.5–5.1)
Sodium: 140 mmol/L (ref 135–145)
Total Bilirubin: 2 mg/dL — ABNORMAL HIGH (ref 0.0–1.2)
Total Protein: 6.1 g/dL — ABNORMAL LOW (ref 6.5–8.1)

## 2024-10-11 LAB — CBC WITH DIFFERENTIAL (CANCER CENTER ONLY)
Abs Immature Granulocytes: 0.02 K/uL (ref 0.00–0.07)
Basophils Absolute: 0.1 K/uL (ref 0.0–0.1)
Basophils Relative: 1 %
Eosinophils Absolute: 0.6 K/uL — ABNORMAL HIGH (ref 0.0–0.5)
Eosinophils Relative: 9 %
HCT: 35.6 % — ABNORMAL LOW (ref 36.0–46.0)
Hemoglobin: 11.6 g/dL — ABNORMAL LOW (ref 12.0–15.0)
Immature Granulocytes: 0 %
Lymphocytes Relative: 39 %
Lymphs Abs: 2.4 K/uL (ref 0.7–4.0)
MCH: 29.4 pg (ref 26.0–34.0)
MCHC: 32.6 g/dL (ref 30.0–36.0)
MCV: 90.4 fL (ref 80.0–100.0)
Monocytes Absolute: 0.4 K/uL (ref 0.1–1.0)
Monocytes Relative: 7 %
Neutro Abs: 2.7 K/uL (ref 1.7–7.7)
Neutrophils Relative %: 44 %
Platelet Count: 283 K/uL (ref 150–400)
RBC: 3.94 MIL/uL (ref 3.87–5.11)
RDW: 15.6 % — ABNORMAL HIGH (ref 11.5–15.5)
WBC Count: 6.2 K/uL (ref 4.0–10.5)
nRBC: 0 % (ref 0.0–0.2)

## 2024-10-11 LAB — LACTATE DEHYDROGENASE: LDH: 175 U/L (ref 105–235)

## 2024-10-11 NOTE — Assessment & Plan Note (Addendum)
 Stable with intermittent fatigue. Hemoglobin stable at 11.6 currently. LDH normal. Haptoglobin was within normal limits on last visit.  Repeat value is pending from today.  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 No signs of active hemolysis. Blood counts, including white blood cells and platelets, are normal. Chemistries are within normal limits.  -Last rituximab  infusion was in December 2023.  -Consider repeat rituximab  infusions if cold agglutinin titer increases.  Decision on another rituximab  course will depend on hemoglobin trends and titer levels, not solely on cold agglutinin value.  -She was advised to contact clinic if fatigue worsens.  -Advise to maintain physical activity as tolerated.  -Recommend pool temperature closer to 90 degrees to avoid cold exposure.  RTC in 3 months for follow-up with repeat labs.

## 2024-10-11 NOTE — Progress Notes (Signed)
 [facility]  HEMATOLOGY CLINIC PROGRESS NOTE  PATIENT NAME: Becky Cox   MR#: [id number] DOB: 25-Oct-1951  Patient Care Team: Becky Ezekiel NOVAK, MD as PCP - General (Internal Medicine) Becky Guillermina BROCKS, MD (Inactive) as Attending Physician (Hematology)  Date of visit: 10/11/2024   ASSESSMENT & PLAN:   Becky Cox is a 73 y.o. 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  Cold agglutinins present Stable with intermittent fatigue. Hemoglobin stable at 11.6 currently. LDH normal. Haptoglobin was within normal limits on last visit.  Repeat value is pending from today.  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 No signs of active hemolysis. Blood counts, including white blood cells and platelets, are normal. Chemistries are within normal limits.  -Last rituximab  infusion was in December 2023.  -Consider repeat rituximab  infusions if cold agglutinin titer increases.  Decision on another rituximab  course will depend on hemoglobin trends and titer levels, not solely on cold agglutinin value.  -She was advised to contact clinic if fatigue worsens.  -Advise to maintain physical activity as tolerated.  -Recommend pool temperature closer to 90 degrees to avoid cold exposure.  RTC in 3 months for follow-up with repeat labs.   General Health Maintenance Routine health maintenance is current.  Screening mammogram on 07/01/2024 showed BI-RADS 1, benign findings.   I spent a total of 23 minutes during this encounter with the patient including review of chart and various tests results, discussions about plan of care and coordination of care plan.  I reviewed lab results and outside records for this visit and  discussed relevant results with the patient. Diagnosis, plan of care and treatment options were also discussed in detail with the patient. Opportunity provided to ask questions and answers provided to her apparent satisfaction. Provided instructions to call our clinic with any problems, questions or concerns prior to return visit. I recommended to continue follow-up with PCP and sub-specialists. She verbalized understanding and agreed with the plan. No barriers to learning was detected.  Becky Patten, MD  10/11/2024 9:16 AM  [facility] - A DEPT OF [name] [name] [name] [address] Dept: [phone] Dept Fax: [phone]   CHIEF COMPLAINT/ REASON FOR VISIT:  Follow-up for cold agglutinin disease.  INTERVAL HISTORY:  Discussed the use of AI scribe software for clinical note transcription with the patient, who gave verbal consent to proceed.  History of Present Illness  Becky Cox is a 73 year old female with anemia who presents for routine follow-up.  Her energy levels have remained stable, and she has not experienced any changes in appetite, unintentional weight loss, fevers, chills, or night sweats.  Her hemoglobin was 11.6 g/dL today and has mostly stayed above 11 g/dL. Her white blood cell count and platelet levels are normal.     SUMMARY OF HEMATOLOGIC HISTORY:  She is being followed in our clinic for anemia and cold agglutinin disease.  Other comorbidities include hypothyroidism, obstructive sleep apnea, bilateral hearing loss, hypertension, rheumatoid arthritis, chronic diarrhea, hypertension, gout.   I reviewed the patient's records extensively and collaborated the history with the patient. Summary of her history is as follows:   March 22 2022:  Colonoscopy.  3 very small polyps  July 29, 2022: WBC 7.2 hemoglobin 10.3 MCV 93 platelet count 311; 45 segs 44 lymphs 5 monos 5  eos 1 basophil.  Specimen was prewarmed to 37 degrees to obtain results.  Technician suspected possibility of cold agglutinins/cryoglobulins present Chemistries notable for creatinine 0.79 albumin 3.9.  AST ALT and alk phos normal T. bili 2.3 direct bilirubin 0.6.  Reticulocyte count 2.8%   September 09 2022:  [facility] Hematology Consult Was told years ago that she was anemic.   Has never taken oral iron.  No history of IV iron or PRBC's.   Does not take ASA.  Mainly takes Tylenol  for pain; rarely ibuprofen No pica to ice.  Uses CPAP every night   Social:  Divorced.  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  Tobacco none.  EtOH   [facility] Mother died 28 MI Father died 8 pneumonia Sister died 12 CVA, MS Sister alive 34 colon cancer Sister alive 82 Parkinson's disease Sister alive 74 HTN Sister alive 68 osteoarthritis Brother alive 85 TBI from motorcycle accident, HTN Brother died 80 CVA Brother died 48 colon cancer   WBC 9.6 hemoglobin 10.2 MCV 97 platelet count 346; 55 segs 35 lymphs 5 monos 4 eos 1 basophil.  Reticulocyte count 3.3% Coombs test complement positive/IgG negative.  Haptoglobin undetectable.  Cold agglutinin titer greater than 1:4096 Hemoglobin electrophoresis normal adult pattern. SPEP with IEP showed no paraprotein. IgG 624 IgA 121 IgM 74 Serum free kappa 25.1 lambda 28.3 with a kappa lambda 0.89 B12 500 folate 27.3 ferritin 242 Copper  128 zinc  97 CMP notable for T. bili of 3.3 glucose 102   September 23, 2022: Underwent bone marrow biopsy and aspirate Pathology revealed A minor CD10 positive B-cell population(1% of the lymphocytes) with kappa excess. Cytogenetics Normal female Karyotype  Neo Complrehensive Myeloid disorder panel normal     September 30, 2022: Scheduled follow-up for management of cold agglutinin disease.  Did not have a lot of discomfort following the bone marrow Reviewed results of labs and bone marrow results with patient.  Experiencing  fatigue.  Having myalgias of legs.  Notes that her urine is dark   October 07 2022:  Rituxan  375 mg/m2  Hgb 8.8 October 14 2022  Rituxan  375 mg/m2.  Hgb 9.2  October 21 2022:  Rituxan  375 mg/m2  Hgb 10.1   October 28 2022:   Had a minor infusion rxn during first Rituxan  infusion but was able to complete it and receive subsequent infusions.     December 09 2022:   Reviewed results of labs with patient and granddaughter.  Feels well.  Had COVID around Christmas for which she took Paxlovid.     WBC 5.5 hemoglobin 11.7 MCV 94.7 platelet count 289; 56 segs 32 lymphs 6 monos 5 eos 1 basophil reticulocyte count 1.6% Coombs positive for complement negative for IgG.  Haptoglobin 74 LDH 255 CMP notable for total protein 5.9   March 10 2023:   Feels well albeit a bit fatigued.  Reviewed results of labs with patient Hgb 11.4.  Cold agglutinin titer 1:1024 Haptoglobin 101  LDH 210   May 05 2023:    To retire at the end of the month but has plans to keep busy.  Reviewed results of labs with patient.  Feels well Hgb 11.4 Retic 2.3 Tbili 2.1 LDH 217    July 07 2023:  Scheduled follow-up for management of cold agglutinin disease.   Now retired.   Feels well.    Hg 12.1  T bili 1.7  LDH 184  I have reviewed the past medical history, past surgical history, social history and family history with the patient and they are unchanged from previous note.  ALLERGIES: She is allergic to rituximab .  MEDICATIONS:  Current Outpatient Medications  Medication Sig Dispense Refill   allopurinol (ZYLOPRIM) 300 MG tablet Take 1 tablet by mouth daily.     amLODipine (NORVASC) 5 MG tablet Take 10 tablets by mouth daily.      Ascorbic Acid (VITAMIN C PO) Take by mouth.     atorvastatin (LIPITOR) 10 MG tablet Take 1 tablet by mouth daily.     diclofenac  Sodium (VOLTAREN ) 1 % GEL Apply topically.     fluticasone (FLONASE) 50 MCG/ACT nasal spray Place 2 sprays into the nose daily.     folic acid  (FOLVITE ) 1 MG  tablet Take 1 mg by mouth daily.     ipratropium (ATROVENT ) 0.06 % nasal spray Place 2 sprays into both nostrils 4 (four) times daily. 15 mL 2   levothyroxine (SYNTHROID) 100 MCG tablet Take 100 mcg by mouth daily.     methotrexate (RHEUMATREX) 2.5 MG tablet take 6 tablets     montelukast (SINGULAIR) 10 MG tablet Take 1 tablet by mouth at bedtime.     Multiple Vitamin (MULTIVITAMIN PO) Take 1 tablet by mouth daily.     olmesartan (BENICAR) 20 MG tablet Take 20 mg by mouth daily.     triamterene-hydrochlorothiazide (MAXZIDE-25) 37.5-25 MG tablet Take 1 tablet by mouth daily.     No current facility-administered medications for this visit.     REVIEW OF SYSTEMS:    ROS  All other pertinent systems were reviewed with the patient and are negative.  PHYSICAL EXAMINATION:   Onc Performance Status - 10/11/24 0900       ECOG Perf Status   ECOG Perf Status Fully active, able to carry on all pre-disease performance without restriction      KPS SCALE   KPS % SCORE Normal, no compliants, no evidence of disease           Vitals:   10/11/24 0900  BP: (!) 112/55  Pulse: 63  Resp: 18  Temp: 97.9 F (36.6 C)  SpO2: 99%   Filed Weights   10/11/24 0900  Weight: 210 lb 11.2 oz (95.6 kg)    Physical Exam Constitutional:      General: She is not in acute distress.    Appearance: Normal appearance.  HENT:     Head: Normocephalic and atraumatic.  Eyes:     General: No scleral icterus.    Conjunctiva/sclera: Conjunctivae normal.  Cardiovascular:     Rate and Rhythm: Normal rate and regular rhythm.     Heart sounds: Normal heart sounds.  Pulmonary:     Effort: Pulmonary effort is normal.     Breath sounds: Normal breath sounds.  Abdominal:     General: There is no distension.  Musculoskeletal:     Right lower leg: No edema.     Left lower leg: No edema.  Neurological:     General: No focal deficit present.     Mental Status: She is alert and oriented to person, place, and  time.  Psychiatric:        Mood and Affect: Mood normal.        Behavior: Behavior normal.        Thought Content: Thought content normal.     LABORATORY DATA:   I have reviewed the  data as listed.  Results for orders placed or performed in visit on 10/11/24  Cold agglutinin titer  Result Value Ref Range   Cold Agglutinin Titer 1:128 (H) Neg <1:32  Haptoglobin  Result Value Ref Range   Haptoglobin 152 42 - 346 mg/dL  Lactate dehydrogenase  Result Value Ref Range   LDH 175 105 - 235 U/L  CMP (Cancer Center only)  Result Value Ref Range   Sodium 140 135 - 145 mmol/L   Potassium 3.6 3.5 - 5.1 mmol/L   Chloride 107 98 - 111 mmol/L   CO2 27 22 - 32 mmol/L   Glucose, Bld 83 70 - 99 mg/dL   BUN 25 (H) 8 - 23 mg/dL   Creatinine 9.18 9.55 - 1.00 mg/dL   Calcium 9.2 8.9 - 89.6 mg/dL   Total Protein 6.1 (L) 6.5 - 8.1 g/dL   Albumin 3.9 3.5 - 5.0 g/dL   AST 23 15 - 41 U/L   ALT 18 0 - 44 U/L   Alkaline Phosphatase 90 38 - 126 U/L   Total Bilirubin 2.0 (H) 0.0 - 1.2 mg/dL   GFR, Estimated >39 >39 mL/min   Anion gap 6 5 - 15  CBC with Differential (Cancer Center Only)  Result Value Ref Range   WBC Count 6.2 4.0 - 10.5 K/uL   RBC 3.94 3.87 - 5.11 MIL/uL   Hemoglobin 11.6 (L) 12.0 - 15.0 g/dL   HCT 64.3 (L) 63.9 - 53.9 %   MCV 90.4 80.0 - 100.0 fL   MCH 29.4 26.0 - 34.0 pg   MCHC 32.6 30.0 - 36.0 g/dL   RDW 84.3 (H) 88.4 - 84.4 %   Platelet Count 283 150 - 400 K/uL   nRBC 0.0 0.0 - 0.2 %   Neutrophils Relative % 44 %   Neutro Abs 2.7 1.7 - 7.7 K/uL   Lymphocytes Relative 39 %   Lymphs Abs 2.4 0.7 - 4.0 K/uL   Monocytes Relative 7 %   Monocytes Absolute 0.4 0.1 - 1.0 K/uL   Eosinophils Relative 9 %   Eosinophils Absolute 0.6 (H) 0.0 - 0.5 K/uL   Basophils Relative 1 %   Basophils Absolute 0.1 0.0 - 0.1 K/uL   Immature Granulocytes 0 %   Abs Immature Granulocytes 0.02 0.00 - 0.07 K/uL  Reticulocytes  Result Value Ref Range   Retic Ct Pct 1.8 0.4 - 3.1 %   RBC. 3.94  3.87 - 5.11 MIL/uL   Retic Count, Absolute 72.5 19.0 - 186.0 K/uL   Immature Retic Fract 8.8 2.3 - 15.9 %     RADIOGRAPHIC STUDIES:  No recent pertinent imaging studies available to review.  Orders Placed This Encounter  Procedures   CBC with Differential (Cancer Center Only)    Standing Status:   Future    Expected Date:   01/11/2025    Expiration Date:   04/11/2025   CMP (Cancer Center only)    Standing Status:   Future    Expected Date:   01/11/2025    Expiration Date:   04/11/2025   Cold agglutinin titer    Standing Status:   Future    Expected Date:   01/11/2025    Expiration Date:   04/11/2025   Haptoglobin    Standing Status:   Future    Expected Date:   01/11/2025    Expiration Date:   04/11/2025   Lactate dehydrogenase    Standing Status:   Future    Expected Date:  01/11/2025    Expiration Date:   04/11/2025   Reticulocytes    Standing Status:   Future    Expected Date:   01/11/2025    Expiration Date:   04/11/2025     Future Appointments  Date Time Provider Department Center  01/10/2025  8:45 AM [facility] None  01/10/2025  9:15 AM Delara Shepheard, [place], MD [facility] None  08/07/2025  2:45 PM Alm Delon SAILOR, DO [facility] None    This document was completed utilizing speech recognition software. Grammatical errors, random word insertions, pronoun errors, and incomplete sentences are an occasional consequence of this system due to software limitations, ambient noise, and hardware issues. Any formal questions or concerns about the content, text or information contained within the body of this dictation should be directly addressed to the provider for clarification.

## 2024-10-12 LAB — HAPTOGLOBIN: Haptoglobin: 152 mg/dL (ref 42–346)

## 2024-10-15 LAB — COLD AGGLUTININ TITER: Cold Agglutinin Titer: 1:128 {titer} — ABNORMAL HIGH

## 2024-10-22 ENCOUNTER — Encounter: Payer: Self-pay | Admitting: Oncology

## 2024-11-19 ENCOUNTER — Other Ambulatory Visit: Payer: Self-pay | Admitting: Internal Medicine

## 2024-11-19 ENCOUNTER — Encounter: Payer: Self-pay | Admitting: Internal Medicine

## 2024-11-19 DIAGNOSIS — R29898 Other symptoms and signs involving the musculoskeletal system: Secondary | ICD-10-CM

## 2024-11-22 ENCOUNTER — Ambulatory Visit
Admission: RE | Admit: 2024-11-22 | Discharge: 2024-11-22 | Disposition: A | Discharge status: Home / Self Care | Source: Ambulatory Visit | Attending: Internal Medicine | Admitting: Internal Medicine

## 2024-11-22 DIAGNOSIS — R29898 Other symptoms and signs involving the musculoskeletal system: Secondary | ICD-10-CM

## 2025-01-10 ENCOUNTER — Inpatient Hospital Stay: Attending: Oncology

## 2025-01-10 ENCOUNTER — Inpatient Hospital Stay: Admitting: Oncology

## 2025-08-07 ENCOUNTER — Ambulatory Visit: Admitting: Dermatology
